# Patient Record
Sex: Male | Born: 1960 | Race: White | Hispanic: No | Smoking: Never smoker
Health system: Southern US, Community
[De-identification: ages and names within clinical notes are randomized; demographics above are authoritative.]

## PROBLEM LIST (undated history)

## (undated) DIAGNOSIS — T7840XA Allergy, unspecified, initial encounter: Secondary | ICD-10-CM

## (undated) DIAGNOSIS — N189 Chronic kidney disease, unspecified: Secondary | ICD-10-CM

## (undated) DIAGNOSIS — G8220 Paraplegia, unspecified: Secondary | ICD-10-CM

## (undated) DIAGNOSIS — N529 Male erectile dysfunction, unspecified: Secondary | ICD-10-CM

## (undated) DIAGNOSIS — Z87440 Personal history of urinary (tract) infections: Secondary | ICD-10-CM

## (undated) DIAGNOSIS — Z978 Presence of other specified devices: Secondary | ICD-10-CM

## (undated) DIAGNOSIS — M62838 Other muscle spasm: Secondary | ICD-10-CM

## (undated) DIAGNOSIS — Z960 Presence of urogenital implants: Secondary | ICD-10-CM

## (undated) HISTORY — PX: TONSILLECTOMY: SUR1361

## (undated) HISTORY — DX: Other muscle spasm: M62.838

## (undated) HISTORY — PX: OTHER SURGICAL HISTORY: SHX169

## (undated) HISTORY — PX: APPENDECTOMY: SHX54

## (undated) HISTORY — DX: Paraplegia, unspecified: G82.20

## (undated) HISTORY — DX: Allergy, unspecified, initial encounter: T78.40XA

## (undated) HISTORY — DX: Presence of urogenital implants: Z96.0

## (undated) HISTORY — DX: Chronic kidney disease, unspecified: N18.9

## (undated) HISTORY — PX: FEMUR FRACTURE SURGERY: SHX633

## (undated) HISTORY — DX: Personal history of urinary (tract) infections: Z87.440

## (undated) HISTORY — DX: Male erectile dysfunction, unspecified: N52.9

## (undated) HISTORY — DX: Presence of other specified devices: Z97.8

## (undated) SURGERY — COLONOSCOPY
Anesthesia: Monitor Anesthesia Care

---

## 2007-12-04 ENCOUNTER — Ambulatory Visit: Payer: Self-pay | Admitting: Family Medicine

## 2007-12-04 DIAGNOSIS — J309 Allergic rhinitis, unspecified: Secondary | ICD-10-CM | POA: Insufficient documentation

## 2007-12-31 ENCOUNTER — Ambulatory Visit: Payer: Self-pay | Admitting: Family Medicine

## 2007-12-31 DIAGNOSIS — N390 Urinary tract infection, site not specified: Secondary | ICD-10-CM | POA: Insufficient documentation

## 2007-12-31 DIAGNOSIS — R5381 Other malaise: Secondary | ICD-10-CM | POA: Insufficient documentation

## 2007-12-31 DIAGNOSIS — R5383 Other fatigue: Secondary | ICD-10-CM

## 2007-12-31 LAB — CONVERTED CEMR LAB
Bilirubin Urine: NEGATIVE
Glucose, Urine, Semiquant: NEGATIVE
Ketones, urine, test strip: NEGATIVE
Nitrite: NEGATIVE
Protein, U semiquant: NEGATIVE
Specific Gravity, Urine: 1.01
Urobilinogen, UA: NEGATIVE
pH: 7

## 2008-01-01 ENCOUNTER — Encounter: Payer: Self-pay | Admitting: Family Medicine

## 2008-01-04 ENCOUNTER — Encounter: Payer: Self-pay | Admitting: Family Medicine

## 2008-01-06 LAB — CONVERTED CEMR LAB
HCT: 46.2 % (ref 39.0–52.0)
Hemoglobin: 14.9 g/dL (ref 13.0–17.0)
MCHC: 32.3 g/dL (ref 30.0–36.0)
MCV: 93.5 fL (ref 78.0–100.0)
Platelets: 284 10*3/uL (ref 150–400)
RBC: 4.94 M/uL (ref 4.22–5.81)
RDW: 13.4 % (ref 11.5–15.5)
Sex Hormone Binding: 15 nmol/L (ref 13–71)
TSH: 1.501 microintl units/mL (ref 0.350–5.50)
Testosterone Free: 56.5 pg/mL (ref 47.0–244.0)
Testosterone-% Free: 2.8 % (ref 1.6–2.9)
Testosterone: 203.42 ng/dL — ABNORMAL LOW (ref 350–890)
WBC: 5.5 10*3/uL (ref 4.0–10.5)

## 2008-02-23 ENCOUNTER — Encounter: Payer: Self-pay | Admitting: Family Medicine

## 2008-02-23 DIAGNOSIS — L89309 Pressure ulcer of unspecified buttock, unspecified stage: Secondary | ICD-10-CM | POA: Insufficient documentation

## 2008-03-07 ENCOUNTER — Telehealth: Payer: Self-pay | Admitting: Family Medicine

## 2008-03-14 ENCOUNTER — Encounter: Payer: Self-pay | Admitting: Family Medicine

## 2008-03-15 ENCOUNTER — Encounter: Payer: Self-pay | Admitting: Family Medicine

## 2008-04-11 ENCOUNTER — Encounter: Payer: Self-pay | Admitting: Family Medicine

## 2008-04-26 ENCOUNTER — Encounter: Payer: Self-pay | Admitting: Family Medicine

## 2008-05-09 ENCOUNTER — Telehealth: Payer: Self-pay | Admitting: Family Medicine

## 2008-05-31 ENCOUNTER — Encounter: Payer: Self-pay | Admitting: Family Medicine

## 2008-06-28 ENCOUNTER — Encounter: Payer: Self-pay | Admitting: Family Medicine

## 2008-07-12 ENCOUNTER — Encounter: Payer: Self-pay | Admitting: Family Medicine

## 2008-08-04 ENCOUNTER — Encounter: Payer: Self-pay | Admitting: Family Medicine

## 2008-09-20 ENCOUNTER — Encounter: Payer: Self-pay | Admitting: Family Medicine

## 2008-09-30 ENCOUNTER — Encounter: Payer: Self-pay | Admitting: Family Medicine

## 2008-10-18 ENCOUNTER — Encounter: Payer: Self-pay | Admitting: Family Medicine

## 2008-11-08 ENCOUNTER — Encounter: Payer: Self-pay | Admitting: Family Medicine

## 2008-11-29 ENCOUNTER — Ambulatory Visit: Payer: Self-pay | Admitting: Family Medicine

## 2008-11-29 DIAGNOSIS — R21 Rash and other nonspecific skin eruption: Secondary | ICD-10-CM | POA: Insufficient documentation

## 2009-02-14 ENCOUNTER — Encounter: Payer: Self-pay | Admitting: Family Medicine

## 2009-03-06 ENCOUNTER — Telehealth: Payer: Self-pay | Admitting: Family Medicine

## 2009-03-13 ENCOUNTER — Ambulatory Visit: Payer: Self-pay | Admitting: Family Medicine

## 2009-03-13 DIAGNOSIS — M899 Disorder of bone, unspecified: Secondary | ICD-10-CM | POA: Insufficient documentation

## 2009-03-13 DIAGNOSIS — G8220 Paraplegia, unspecified: Secondary | ICD-10-CM | POA: Insufficient documentation

## 2009-03-13 DIAGNOSIS — M8580 Other specified disorders of bone density and structure, unspecified site: Secondary | ICD-10-CM | POA: Insufficient documentation

## 2009-03-13 LAB — CONVERTED CEMR LAB
Bilirubin Urine: NEGATIVE
Glucose, Urine, Semiquant: NEGATIVE
Ketones, urine, test strip: NEGATIVE
Nitrite: NEGATIVE
Protein, U semiquant: NEGATIVE
Specific Gravity, Urine: 1.01
Urobilinogen, UA: 0.2
pH: 7

## 2009-03-14 ENCOUNTER — Encounter: Payer: Self-pay | Admitting: Family Medicine

## 2009-03-15 ENCOUNTER — Encounter: Payer: Self-pay | Admitting: Family Medicine

## 2009-03-15 ENCOUNTER — Encounter: Admission: RE | Admit: 2009-03-15 | Discharge: 2009-03-15 | Payer: Self-pay | Admitting: Family Medicine

## 2009-03-15 LAB — CONVERTED CEMR LAB
ALT: 21 units/L (ref 0–53)
AST: 21 units/L (ref 0–37)
Albumin: 4.8 g/dL (ref 3.5–5.2)
Alkaline Phosphatase: 93 units/L (ref 39–117)
BUN: 11 mg/dL (ref 6–23)
CO2: 21 meq/L (ref 19–32)
Calcium: 9.8 mg/dL (ref 8.4–10.5)
Chloride: 103 meq/L (ref 96–112)
Cholesterol: 231 mg/dL — ABNORMAL HIGH (ref 0–200)
Creatinine, Ser: 0.88 mg/dL (ref 0.40–1.50)
Glucose, Bld: 94 mg/dL (ref 70–99)
HDL: 45 mg/dL (ref >39)
LDL Cholesterol: 156 mg/dL — ABNORMAL HIGH (ref 0–99)
Potassium: 4.1 meq/L (ref 3.5–5.3)
Sodium: 138 meq/L (ref 135–145)
TSH: 2.051 microintl units/mL (ref 0.350–4.500)
Total Bilirubin: 0.6 mg/dL (ref 0.3–1.2)
Total CHOL/HDL Ratio: 5.1
Total Protein: 6.9 g/dL (ref 6.0–8.3)
Triglycerides: 151 mg/dL — ABNORMAL HIGH (ref <150)
VLDL: 30 mg/dL (ref 0–40)
Vit D, 25-Hydroxy: 59 ng/mL (ref 30–89)

## 2009-03-17 ENCOUNTER — Telehealth: Payer: Self-pay | Admitting: Family Medicine

## 2009-03-28 ENCOUNTER — Ambulatory Visit: Payer: Self-pay | Admitting: Family Medicine

## 2009-03-28 DIAGNOSIS — R197 Diarrhea, unspecified: Secondary | ICD-10-CM | POA: Insufficient documentation

## 2009-03-28 LAB — CONVERTED CEMR LAB
Bilirubin Urine: NEGATIVE
Glucose, Urine, Semiquant: NEGATIVE
Ketones, urine, test strip: NEGATIVE
Nitrite: NEGATIVE
Protein, U semiquant: NEGATIVE
Specific Gravity, Urine: 1.01
Urobilinogen, UA: 0.2
pH: 6.5

## 2009-03-30 DIAGNOSIS — R799 Abnormal finding of blood chemistry, unspecified: Secondary | ICD-10-CM | POA: Insufficient documentation

## 2009-03-30 LAB — CONVERTED CEMR LAB
ALT: 24 units/L (ref 0–53)
AST: 18 units/L (ref 0–37)
Albumin: 4.5 g/dL (ref 3.5–5.2)
Alkaline Phosphatase: 89 units/L (ref 39–117)
Amylase: 101 units/L (ref 0–105)
BUN: 12 mg/dL (ref 6–23)
Basophils Absolute: 0 10*3/uL (ref 0.0–0.1)
Basophils Relative: 1 % (ref 0–1)
CO2: 23 meq/L (ref 19–32)
Calcium: 9.8 mg/dL (ref 8.4–10.5)
Chloride: 105 meq/L (ref 96–112)
Creatinine, Ser: 0.95 mg/dL (ref 0.40–1.50)
Eosinophils Absolute: 0.1 10*3/uL (ref 0.0–0.7)
Eosinophils Relative: 3 % (ref 0–5)
Glucose, Bld: 133 mg/dL — ABNORMAL HIGH (ref 70–99)
HCT: 48.4 % (ref 39.0–52.0)
Hemoglobin: 16.3 g/dL (ref 13.0–17.0)
Lipase: 165 units/L — ABNORMAL HIGH (ref 0–75)
Lymphocytes Relative: 19 % (ref 12–46)
Lymphs Abs: 1 10*3/uL (ref 0.7–4.0)
MCHC: 33.7 g/dL (ref 30.0–36.0)
MCV: 90.3 fL (ref 78.0–100.0)
Monocytes Absolute: 0.4 10*3/uL (ref 0.1–1.0)
Monocytes Relative: 8 % (ref 3–12)
Neutro Abs: 3.4 10*3/uL (ref 1.7–7.7)
Neutrophils Relative %: 69 % (ref 43–77)
Platelets: 226 10*3/uL (ref 150–400)
Potassium: 4.5 meq/L (ref 3.5–5.3)
RBC: 5.36 M/uL (ref 4.22–5.81)
RDW: 13.1 % (ref 11.5–15.5)
Sodium: 141 meq/L (ref 135–145)
TSH: 1.899 microintl units/mL (ref 0.350–4.500)
Total Bilirubin: 0.4 mg/dL (ref 0.3–1.2)
Total Protein: 6.8 g/dL (ref 6.0–8.3)
WBC: 5 10*3/uL (ref 4.0–10.5)

## 2009-04-03 ENCOUNTER — Encounter: Payer: Self-pay | Admitting: Family Medicine

## 2009-04-04 LAB — CONVERTED CEMR LAB
Amylase: 36 units/L (ref 0–105)
Lipase: 21 units/L (ref 0–75)

## 2009-04-10 ENCOUNTER — Ambulatory Visit: Payer: Self-pay | Admitting: Family Medicine

## 2009-04-10 DIAGNOSIS — K3189 Other diseases of stomach and duodenum: Secondary | ICD-10-CM | POA: Insufficient documentation

## 2009-04-10 DIAGNOSIS — R1013 Epigastric pain: Secondary | ICD-10-CM

## 2009-04-10 LAB — CONVERTED CEMR LAB
Bilirubin Urine: NEGATIVE
Glucose, Urine, Semiquant: NEGATIVE
Ketones, urine, test strip: NEGATIVE
Nitrite: NEGATIVE
Protein, U semiquant: NEGATIVE
Specific Gravity, Urine: 1.015
Urobilinogen, UA: 0.2
pH: 5.5

## 2009-04-18 ENCOUNTER — Encounter: Admission: RE | Admit: 2009-04-18 | Discharge: 2009-04-18 | Payer: Self-pay | Admitting: Family Medicine

## 2009-10-04 ENCOUNTER — Encounter: Payer: Self-pay | Admitting: Family Medicine

## 2009-10-30 ENCOUNTER — Ambulatory Visit: Payer: Self-pay | Admitting: Family Medicine

## 2009-10-30 ENCOUNTER — Telehealth (INDEPENDENT_AMBULATORY_CARE_PROVIDER_SITE_OTHER): Payer: Self-pay | Admitting: *Deleted

## 2009-10-30 LAB — CONVERTED CEMR LAB
Bilirubin Urine: NEGATIVE
Glucose, Urine, Semiquant: NEGATIVE
Ketones, urine, test strip: NEGATIVE
Nitrite: NEGATIVE
Protein, U semiquant: NEGATIVE
Specific Gravity, Urine: 1.01
Urobilinogen, UA: 0.2
pH: 7.5

## 2009-10-31 ENCOUNTER — Encounter: Payer: Self-pay | Admitting: Family Medicine

## 2010-01-29 ENCOUNTER — Ambulatory Visit: Payer: Self-pay | Admitting: Family Medicine

## 2010-01-29 DIAGNOSIS — J209 Acute bronchitis, unspecified: Secondary | ICD-10-CM | POA: Insufficient documentation

## 2010-06-19 ENCOUNTER — Ambulatory Visit: Payer: Self-pay | Admitting: Family Medicine

## 2010-06-19 LAB — CONVERTED CEMR LAB
Bilirubin Urine: NEGATIVE
Glucose, Urine, Semiquant: NEGATIVE
Ketones, urine, test strip: NEGATIVE
Nitrite: NEGATIVE
Protein, U semiquant: NEGATIVE
Specific Gravity, Urine: 1.01
Urobilinogen, UA: 0.2
pH: 7

## 2010-06-21 ENCOUNTER — Encounter: Payer: Self-pay | Admitting: Family Medicine

## 2010-09-18 ENCOUNTER — Telehealth: Payer: Self-pay | Admitting: Family Medicine

## 2010-10-16 NOTE — Letter (Signed)
Summary: [facility]  [facility]   Imported By: Lanelle Bal 01/02/2010 12:22:52  _____________________________________________________________________  External Attachment:    Type:   Image     Comment:   External Document
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
## 2010-10-16 NOTE — Letter (Signed)
Summary: [facility]  [facility]   Imported By: Lanelle Bal 07/21/2008 13:26:31  _____________________________________________________________________  External Attachment:    Type:   Image     Comment:   External Document
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
## 2010-10-16 NOTE — Letter (Signed)
Summary: [facility]  [facility]   Imported By: Lanelle Bal 07/25/2008 11:36:10  _____________________________________________________________________  External Attachment:    Type:   Image     Comment:   External Document

## 2010-10-16 NOTE — Assessment & Plan Note (Signed)
Summary: UA with cx  Nurse Visit   Vitals Entered By: Payton Spark CMA (October 30, 2009 1:12 PM)  Allergies: 1)  Cipro Laboratory Results   Urine Tests    Routine Urinalysis   Color: yellow Glucose: negative   (Normal Range: Negative) Bilirubin: negative   (Normal Range: Negative) Ketone: negative   (Normal Range: Negative) Spec. Gravity: 1.010   (Normal Range: 1.003-1.035) Blood: trace-intact   (Normal Range: Negative) pH: 7.5   (Normal Range: 5.0-8.0) Protein: negative   (Normal Range: Negative) Urobilinogen: 0.2   (Normal Range: 0-1) Nitrite: negative   (Normal Range: Negative) Leukocyte Esterace: small   (Normal Range: Negative)       Orders Added: 1)  UA Dipstick w/o Micro (automated)  [81003] 2)  T-Culture, Urine [[id number]]

## 2010-10-16 NOTE — Assessment & Plan Note (Signed)
Summary: UTI  Nurse Visit   Vital Signs:  Patient profile:   50 year old male Temp:     98.3 degrees F  Vitals Entered By: Payton Spark CMA (June 19, 2010 4:33 PM)  Impression & Recommendations:  Problem # 1:  UTI (ICD-599.0) I/O cath urine specimen + for UTI.  Send for cx given high risk status as a paraplegic. Will start him on Levaquin and f/u cx in 48 hrs.   The following medications were removed from the medication list:    Zithromax Z-pak 250 Mg Tabs (Azithromycin) .Marland Kitchen... 2 tabs by mouth x 1 day then 1 tab by mouth daily x 4 days His updated medication list for this problem includes:    Trimethoprim 100 Mg Tabs (Trimethoprim) .Marland Kitchen... Take 1 tablet by mouth once a day    Levaquin 750 Mg Tabs (Levofloxacin) .Marland Kitchen... 1 tab by mouth daily x 5 days  Orders: UA Dipstick w/o Micro (automated)  (81003) T-Culture, Urine ([id number])  Complete Medication List: 1)  Trimethoprim 100 Mg Tabs (Trimethoprim) .... Take 1 tablet by mouth once a day 2)  Diazepam 5 Mg Tabs (Diazepam) .... Take 1-2 by mouth daily as needed 3)  Asa 325mg   .... 1 by mouth daily 4)  B Complex  5)  Vit C  6)  Cranberry  7)  Vit E  8)  Fish Oil  9)  One A Day Mens Mv  10)  Stool Softner  11)  Proair Hfa 108 (90 Base) Mcg/act Aers (Albuterol sulfate) .... 2 puffs q 6 hrs prn 12)  Levaquin 750 Mg Tabs (Levofloxacin) .Marland Kitchen.. 1 tab by mouth daily x 5 days   CC: ? UTI x weeks. C/o dysuria.    Current Medications (verified): 1)  Trimethoprim 100 Mg  Tabs (Trimethoprim) .... Take 1 Tablet By Mouth Once A Day 2)  Diazepam 5 Mg  Tabs (Diazepam) .... Take 1-2 By Mouth Daily As Needed 3)  Asa 325mg  .... 1 By Mouth Daily 4)  B Complex 5)  Vit C 6)  Cranberry 7)  Vit E 8)  Fish Oil 9)  One A Day Mens Mv 10)  Stool Softner 11)  Proair Hfa 108 (90 Base) Mcg/act Aers (Albuterol Sulfate) .... 2 Puffs Q 6 Hrs Prn  Allergies (verified): 1)  Cipro Laboratory Results   Urine Tests    Routine Urinalysis     Color: yellow Appearance: Clear Glucose: negative   (Normal Range: Negative) Bilirubin: negative   (Normal Range: Negative) Ketone: negative   (Normal Range: Negative) Spec. Gravity: 1.010   (Normal Range: 1.003-1.035) Blood: large   (Normal Range: Negative) pH: 7.0   (Normal Range: 5.0-8.0) Protein: negative   (Normal Range: Negative) Urobilinogen: 0.2   (Normal Range: 0-1) Nitrite: negative   (Normal Range: Negative) Leukocyte Esterace: small   (Normal Range: Negative)       Orders Added: 1)  UA Dipstick w/o Micro (automated)  [81003] 2)  T-Culture, Urine [[id number]] 3)  Est. Patient Level I [[address]] Prescriptions: LEVAQUIN 750 MG TABS (LEVOFLOXACIN) 1 tab by mouth daily x 5 days  #5 tabs x 0   Entered and Authorized by:   Seymour Bars DO   Signed by:   Seymour Bars DO on 06/19/2010   Method used:   Electronically to        [facility]* (retail)       [address] [place], [place]  [address]  Ph: [phone]       Fax: [phone]   RxID:   [id number]   Appended Document: UTI LMOM informing Pt of the above
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
## 2010-10-16 NOTE — Letter (Signed)
Summary: Discharge Summary  Discharge Summary   Imported By: Kathlene November 05/09/2008 15:42:36  _____________________________________________________________________  External Attachment:    Type:   Image     Comment:   External Document
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
## 2010-10-16 NOTE — Letter (Signed)
Summary: [facility]  [facility]   Imported By: Lanelle Bal 08/06/2008 08:24:17  _____________________________________________________________________  External Attachment:    Type:   Image     Comment:   External Document

## 2010-10-16 NOTE — Progress Notes (Signed)
Summary: SUSPECTED UTI  Phone Note Call from Patient   Summary of Call: Marylou Flesher, please call pt for suspected uti [phone] -thanks, Diane  Follow-up for Phone Call        Nurse visit scheduled for 1pm Follow-up by: Payton Spark CMA,  October 30, 2009 12:28 PM
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
## 2010-10-16 NOTE — Letter (Signed)
Summary: [facility]  [facility]   Imported By: Lanelle Bal 01/04/2009 12:15:56  _____________________________________________________________________  External Attachment:    Type:   Image     Comment:   External Document

## 2010-10-16 NOTE — Letter (Signed)
Summary: [facility]  [facility]   Imported By: Maryln Gottron 03/09/2009 15:11:54  _____________________________________________________________________  External Attachment:    Type:   Image     Comment:   External Document

## 2010-10-16 NOTE — Letter (Signed)
Summary: [facility]  [facility]   Imported By: Lanelle Bal 10/05/2008 14:52:06  _____________________________________________________________________  External Attachment:    Type:   Image     Comment:   External Document

## 2010-10-16 NOTE — Miscellaneous (Signed)
Summary: stage 4 sacral decub/ cellulitis - admitted  Clinical Lists Changes  Problems: Assessed PRESSURE ULCER BUTTOCK as comment only - Stage 4 sacral decub with cellulitis, fever and WBC of 14 at [facility] ED 03-05-08.  Admitted to [facility] for surgical debridement, IV antibiotics.  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  Admitted to [facility] for surgical debridement, IV antibiotics.    Complete Medication List: 1)  Trimethoprim 100 Mg Tabs (Trimethoprim) .... Take 1 tablet by mouth once a day 2)  Diazepam 5 Mg Tabs (Diazepam) .... Take 1-2 by mouth daily as needed 3)  Flonase 50 Mcg/act Susp (Fluticasone propionate) .... 2 sprays per nostril daily 4)  Fexofenadine Hcl 180 Mg Tabs (Fexofenadine hcl) .Marland Kitchen.. 1 tab by mouth daily

## 2010-10-16 NOTE — Letter (Signed)
Summary: [facility]  [facility]   Imported By: Lanelle Bal 08/15/2008 14:52:36  _____________________________________________________________________  External Attachment:    Type:   Image     Comment:   External Document

## 2010-10-16 NOTE — Letter (Signed)
Summary: Discharge Summary  Discharge Summary   Imported By: Harlene Salts 05/13/2008 14:59:32  _____________________________________________________________________  External Attachment:    Type:   Image     Comment:   External Document

## 2010-10-16 NOTE — Progress Notes (Signed)
Summary: NEED REFILLS 90 DAY SUPPLY  Phone Note Call from Patient Call back at [phone]   Caller: Patient Call For: DOCTOR Summary of Call: WANT 90 DAY SUPPLY RX'S FOR DIAZEPAM AND TRIMETHOPRIM WITH REFILLS. PATIENT ALSO REQUESTING 30 DAY SUPPLY TO BE SENT TO [facility] SINCE HE IS COMPLETELY OUT OF DIAZEPAM AND NEED NOW. PATIENT WOULD LIKE THE 90-DAY SUPPLY RX'S MAILED TO HIS HOME ADDRESS SINCE HE IS BEDRIDDEN AT THIS POINT FOR DECUB ULCERS. Initial call taken by: Harlene Salts,  May 09, 2008 11:31 AM      Prescriptions: DIAZEPAM 5 MG  TABS (DIAZEPAM) take 1-2 by mouth daily as needed  #90 x 0   Entered and Authorized by:   Nani Gasser MD   Signed by:   Nani Gasser MD on 05/09/2008   Method used:   Print then Give to Patient   RxID:   [id number] TRIMETHOPRIM 100 MG  TABS (TRIMETHOPRIM) Take 1 tablet by mouth once a day  #90 x 2   Entered and Authorized by:   Nani Gasser MD   Signed by:   Nani Gasser MD on 05/09/2008   Method used:   Print then Give to Patient   RxID:   [id number]   Appended Document: NEED REFILLS 90 DAY SUPPLY Prescriptions: DIAZEPAM 5 MG  TABS (DIAZEPAM) take 1-2 by mouth daily as needed  #30 x 0   Entered and Authorized by:   Nani Gasser MD   Signed by:   Nani Gasser MD on 05/10/2008   Method used:   Printed then faxed to ...         RxID:   [id number]
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
## 2010-10-16 NOTE — Assessment & Plan Note (Signed)
Summary: Diarrhea, UTI   Vital Signs:  Patient profile:   50 year old male Height:      66 inches Pulse rate:   111 / minute BP sitting:   130 / 79  (left arm) Cuff size:   large  Vitals Entered By: Kathlene November (March 28, 2009 8:16 AM) CC: sediment in bag, strong odor to urine   Primary Care Provider:  Seymour Bars DO  CC:  sediment in bag and strong odor to urine.  History of Present Illness: sediment in bag, strong odor to urine.  Dr. Leonard Schwartz put him on Levaquin on July 2nd for Klebsiella and psuedomonas UTI by culture.  Completed the course. Sxs started back last Friday.    Did have diarrhea about 2 weeks before UTI sxs. Started afer drinking milk. No diarrhea from the levaquin. Stomach has been gurgling after eating dairy. No pain but has not feeling from epigastric area down. No diarrhea. No GERD.  No constipation. No known hx of milk allergy. Has also had bad muscle spasms.  Has tried baclofen in the past but mentally made him feel flat.  Tried dantrolene earlier in the year and had bad side effects.  Interupted his bowel routine.   Current Medications (verified): 1)  Trimethoprim 100 Mg  Tabs (Trimethoprim) .... Take 1 Tablet By Mouth Once A Day 2)  Diazepam 5 Mg  Tabs (Diazepam) .... Take 1-2 By Mouth Daily As Needed 3)  Asa 325mg  .... 1 By Mouth Daily 4)  B Complex 5)  Vit C 6)  Cranberry 7)  Vit E 8)  Fish Oil 9)  One A Day Mens Mv 10)  Stool Softner 11)  Bactroban 2 % Oint (Mupirocin) .... Apply To Abdominal Wound Three Times A Day X 10 Days  Allergies (verified): 1)  Cipro  Comments:  Nurse/Medical Assistant: The patient's medications and allergies were reviewed with the patient and were updated in the Medication and Allergy Lists. Kathlene November (March 28, 2009 8:18 AM)  Physical Exam  General:  Well-developed,well-nourished,in no acute distress; alert,appropriate and cooperative throughout examination   Impression & Recommendations:  Problem # 1:  DIARRHEA  (ICD-787.91) Assessment New Will get labs to rule out liver and pancreas prolblems. OVer all the diarrhea has improved just having stomach upset so will also check for mild allergy. If white count elevated consider futher eval of abdomen. No prior exposure to ABX so C. diff colitis is very unlikely.  Orders: T-Comprehensive Metabolic Panel [id number]) T-CBC w/Diff [id number]) T-TSH [id number]) T-Amylase [phone]) T-Lipase [id number]) T- * Misc. Laboratory test [id number])  Problem # 2:  UTI (ICD-599.0) Assessment: Unchanged Still having sxs. Will resend cutlure. Will restart Levaquin and if + culture will extend for total of 10 days. Call if not improving.  The following medications were removed from the medication list:    Levaquin 750 Mg Tabs (Levofloxacin) .Marland Kitchen... 1 tab by mouth daily x 5 days. His updated medication list for this problem includes:    Trimethoprim 100 Mg Tabs (Trimethoprim) .Marland Kitchen... Take 1 tablet by mouth once a day    Levaquin 500 Mg Tabs (Levofloxacin) .Marland Kitchen... Take 1 tablet by mouth once a day for 5 days  Orders: UA Dipstick w/o Micro (automated)  (81003) T-Urine Culture (Spectrum Order) [id number])  Complete Medication List: 1)  Trimethoprim 100 Mg Tabs (Trimethoprim) .... Take 1 tablet by mouth once a day 2)  Diazepam 5 Mg Tabs (Diazepam) .... Take 1-2 by mouth daily as needed 3)  Asa 325mg   .Marland KitchenMarland KitchenMarland Kitchen  1 by mouth daily 4)  B Complex  5)  Vit C  6)  Cranberry  7)  Vit E  8)  Fish Oil  9)  One A Day Mens Mv  10)  Stool Softner  11)  Bactroban 2 % Oint (Mupirocin) .... Apply to abdominal wound three times a day x 10 days 12)  Levaquin 500 Mg Tabs (Levofloxacin) .... Take 1 tablet by mouth once a day for 5 days Prescriptions: LEVAQUIN 500 MG TABS (LEVOFLOXACIN) Take 1 tablet by mouth once a day for 5 days  #5 x 0   Entered and Authorized by:   Nani Gasser MD   Signed by:   Nani Gasser MD on 03/28/2009   Method used:   Electronically to         [facility]* (retail)       [address] [place], [place]  [address]       Ph: [phone]       Fax: [phone]   RxID:   [id number]   Laboratory Results   Urine Tests  Date/Time Received: 03/28/2009 Date/Time Reported: 03/28/2009  Routine Urinalysis   Color: yellow Appearance: Clear Glucose: negative   (Normal Range: Negative) Bilirubin: negative   (Normal Range: Negative) Ketone: negative   (Normal Range: Negative) Spec. Gravity: 1.010   (Normal Range: 1.003-1.035) Blood: trace-intact   (Normal Range: Negative) pH: 6.5   (Normal Range: 5.0-8.0) Protein: negative   (Normal Range: Negative) Urobilinogen: 0.2   (Normal Range: 0-1) Nitrite: negative   (Normal Range: Negative) Leukocyte Esterace: small   (Normal Range: Negative)

## 2010-10-16 NOTE — Progress Notes (Signed)
Summary: UA culture results  Phone Note Call from Patient Call back at Work Phone [phone]   Caller: Patient Call For: Seymour Bars DO Summary of Call: Pt calls wanting the UA culture results. KJ LPN Initial call taken by: Kathlene November,  March 17, 2009 9:00 AM  Follow-up for Phone Call        Just came back this AM.  + for both Pseudomonas and Klebsiella.  Both sensitive to Levaquin.  Will treat for 5 days.   Follow-up by: Seymour Bars DO,  March 17, 2009 9:30 AM    New/Updated Medications: LEVAQUIN 750 MG TABS (LEVOFLOXACIN) 1 tab by mouth daily x 5 days.   Prescriptions: LEVAQUIN 750 MG TABS (LEVOFLOXACIN) 1 tab by mouth daily x 5 days.  #5 x 0   Entered and Authorized by:   Seymour Bars DO   Signed by:   Seymour Bars DO on 03/17/2009   Method used:   Electronically to        [facility]* (retail)       [address] [place], [place]  [address]       Ph: [phone]       Fax: [phone]   RxID:   [id number]   Appended Document: UA culture results Pt notified of results nad that med sent to pharmacy. KJ LPN

## 2010-10-18 NOTE — Progress Notes (Signed)
Summary: Needs Protopic ointment  Phone Note Call from Patient Call back at Home Phone [phone]   Caller: Patient Call For: Thomas Bars DO Summary of Call: Pt needs the Protopic topical ointment sent to [facility]. Uses this for skin irritation. Got last year and hasn't needed again until now. Initial call taken by: Kathlene November LPN,  September 18, 2010 10:34 AM    New/Updated Medications: PROTOPIC 0.03 % OINT (TACROLIMUS) apply two times a day as directed Prescriptions: PROTOPIC 0.03 % OINT (TACROLIMUS) apply two times a day as directed  #60 g x 0   Entered and Authorized by:   Thomas Bars DO   Signed by:   Thomas Bars DO on 09/18/2010   Method used:   Faxed to ...       [facility] (mail-order)             , [place]         Ph: [phone]       Fax: [phone]   RxID:   [id number] PROTOPIC 0.03 % OINT (TACROLIMUS) apply two times a day as directed  #60 g x 0   Entered and Authorized by:   Thomas Bars DO   Signed by:   Thomas Bars DO on 09/18/2010   Method used:   Electronically to        [facility]* (retail)       [address] [place], [place]  [address]       Ph: [phone]       Fax: [phone]   RxID:   [id number]

## 2010-12-31 ENCOUNTER — Telehealth: Payer: Self-pay | Admitting: Family Medicine

## 2010-12-31 NOTE — Telephone Encounter (Signed)
Urine specimen being dropped off to office, believes he has a UTI.  Please call patient at [phone].

## 2011-01-14 ENCOUNTER — Telehealth: Payer: Self-pay | Admitting: Family Medicine

## 2011-01-14 ENCOUNTER — Ambulatory Visit (INDEPENDENT_AMBULATORY_CARE_PROVIDER_SITE_OTHER): Payer: 59 | Admitting: Family Medicine

## 2011-01-14 DIAGNOSIS — N390 Urinary tract infection, site not specified: Secondary | ICD-10-CM

## 2011-01-14 DIAGNOSIS — R300 Dysuria: Secondary | ICD-10-CM

## 2011-01-14 LAB — POCT URINALYSIS DIPSTICK
Bilirubin, UA: NEGATIVE
Glucose, UA: NEGATIVE
Ketones, UA: NEGATIVE
Nitrite, UA: NEGATIVE
Protein, UA: NEGATIVE
Spec Grav, UA: 1.015
Urobilinogen, UA: 0.2
pH, UA: 6.5

## 2011-01-14 NOTE — Telephone Encounter (Addendum)
Pt called and wants permission to drop off urine specimen for ? UTI. PLAN:  Triage nurse called pt but had to [facility] for pt telling him to call back to triage nurse for further instructions.  Pending pt call back.  Pt returned the call to nurse by leaving another message on vm.  Triage nurse responded and told pt to come @ 3:00pm for nurse visit and give clean catch urine specimen.  Pt voiced understanding. Jarvis Newcomer, LPN [name]

## 2011-01-14 NOTE — Telephone Encounter (Signed)
Pls let pt know that his urinalysis was inconclusive.  I sent it for culture.  Is he symptomatic?  Should get results back WED or Thurs.

## 2011-01-14 NOTE — Telephone Encounter (Signed)
Pt aware of the above  

## 2011-01-14 NOTE — Progress Notes (Signed)
  Subjective:    Patient ID: Thomas Schroeder, male    DOB: 07-Jun-1961, 50 y.o.   MRN: [id number]  HPI    Review of Systems     Objective:   Physical Exam        Assessment & Plan:

## 2011-01-17 ENCOUNTER — Telehealth: Payer: Self-pay | Admitting: Family Medicine

## 2011-01-17 ENCOUNTER — Telehealth: Payer: Self-pay | Admitting: *Deleted

## 2011-01-17 LAB — URINE CULTURE: Colony Count: >=100000

## 2011-01-17 NOTE — Telephone Encounter (Signed)
LMOM informing Pt of the above. Pt to CB about urology referral

## 2011-01-17 NOTE — Telephone Encounter (Signed)
Pls let pt know that his urine cx grew out multiple species which usually means that it's contaminated.  If he is having urinary symptoms, I can either get him in here or give his complicated history, in with urology.

## 2011-01-17 NOTE — Telephone Encounter (Signed)
What are his symptoms?

## 2011-01-17 NOTE — Telephone Encounter (Signed)
Pt states he does not want to see urology bc he has seen multiple in the past and has never been helped. Pt states he is still having Sxs and has had the Sxs x 3 weeks. Please advise.

## 2011-01-18 MED ORDER — LEVOFLOXACIN 750 MG PO TABS: 750.0000 mg | ORAL_TABLET | Freq: Every day | ORAL | Status: DC

## 2011-01-18 NOTE — Telephone Encounter (Signed)
I will go ahead and treat Thomas Schroeder with a round of Levaquin but without a clear urine culture, this may or may not work.  If symptoms have not resolved in 5-7 days, I do want him to come in to see me for an OV.

## 2011-01-18 NOTE — Telephone Encounter (Signed)
Pt states his urine has foul odor, diarrhea, pain, sensitivity, and pressure but denies fever. Pt is very frustrated and would like relief. Please advise.

## 2011-01-18 NOTE — Telephone Encounter (Signed)
Pt aware of the above  

## 2011-01-25 ENCOUNTER — Ambulatory Visit (INDEPENDENT_AMBULATORY_CARE_PROVIDER_SITE_OTHER): Payer: 59 | Admitting: Family Medicine

## 2011-01-25 ENCOUNTER — Telehealth (INDEPENDENT_AMBULATORY_CARE_PROVIDER_SITE_OTHER): Payer: 59 | Admitting: Family Medicine

## 2011-01-25 VITALS — BP 146/96 | HR 64 | Temp 98.1°F | Resp 20 | Ht 66.0 in | Wt 180.0 lb

## 2011-01-25 DIAGNOSIS — Z125 Encounter for screening for malignant neoplasm of prostate: Secondary | ICD-10-CM

## 2011-01-25 DIAGNOSIS — Z13220 Encounter for screening for lipoid disorders: Secondary | ICD-10-CM

## 2011-01-25 DIAGNOSIS — Z130 Encounter for screening for diseases of the blood and blood-forming organs and certain disorders involving the immune mechanism: Secondary | ICD-10-CM

## 2011-01-25 DIAGNOSIS — N390 Urinary tract infection, site not specified: Secondary | ICD-10-CM

## 2011-01-25 LAB — POCT URINALYSIS DIPSTICK
Bilirubin, UA: NEGATIVE
Glucose, UA: NEGATIVE
Ketones, UA: NEGATIVE
Nitrite, UA: NEGATIVE
Protein, UA: NEGATIVE
Spec Grav, UA: 1.015
Urobilinogen, UA: 0.2
pH, UA: 7.5

## 2011-01-25 MED ORDER — LEVOFLOXACIN 750 MG PO TABS: 750.0000 mg | ORAL_TABLET | Freq: Every day | ORAL | Status: DC

## 2011-01-25 NOTE — Patient Instructions (Signed)
Restart Levaquin today and take for 10 days.  Call me if any problems.  Update fasting labs Mon morning downstairs. I will call you w/ results Tues or Wed.  Return for a Physical in August.

## 2011-01-25 NOTE — Progress Notes (Signed)
  Subjective:    Patient ID: Thomas Schroeder, male    DOB: 03-12-1961, 50 y.o.   MRN: [id number]  HPI 50 yo WM presents for a recent UTI.  He is paraplegic and does a weeklong foley catheter which he changes at home.  He has been doing this since 1995 after having leakage from I/O catheters. He noticed about 2 wks ago that he had white sediment in his urine.  He then started to have looser stools.  He gets an increase in HAs with abdominal bloating.  He usually gets more gas.  No fever or chills but had a little more fatigue.  No nausea or vomitting.  He took a round of levaquin though his urine culture showed multiple species.  His GI symptoms and cloudy urine quickly improved once he started Levaquin.  But after completing it on Tuesday this wk, he woke up Wed with cloudy urine and diarrhea again.  This UTI seems to have been precipitated by a business trip to [facility]  He had to cut back on fluid intake due to the long flight and not being able to empty his leg bag.  He is on trimethoprim 100 mg daily for prophylaxis.  He has been getting UTIs about 2-3 a yr.  Denies blood in the urine.  He keeps tubing clean and is not meeting resistance with the tubing BP 146/96  Pulse 64  Temp(Src) 98.1 F (36.7 C) (Oral)  Resp 20  Ht 5\' 6"  (1.676 m)  Wt 180 lb (81.647 kg)  BMI 29.05 kg/m2  SpO2 100%     Review of Systems  Constitutional: Negative for fever, chills, appetite change and fatigue.  Respiratory: Negative for shortness of breath.   Cardiovascular: Negative for chest pain, palpitations and leg swelling.  Gastrointestinal: Positive for diarrhea and abdominal distention. Negative for nausea, vomiting, abdominal pain, constipation and blood in stool.  Genitourinary: Negative for hematuria, decreased urine volume and discharge.  Neurological: Positive for headaches. Negative for weakness.       Objective:   Physical Exam  Constitutional: He appears well-developed and well-nourished. No  distress.  Eyes: Conjunctivae are normal. No scleral icterus.  Neck: Neck supple.  Cardiovascular: Normal rate, regular rhythm and normal heart sounds.   No murmur heard. Pulmonary/Chest: Effort normal and breath sounds normal.  Abdominal: Soft. He exhibits no distension. There is no tenderness.  Musculoskeletal: He exhibits edema (1+nonpitting bilat leg edema).  Skin: Skin is warm and dry.  Psychiatric: He has a normal mood and affect.          Assessment & Plan:

## 2011-01-25 NOTE — Assessment & Plan Note (Signed)
UTI last wk with recurrence after completing 5 days of levaquin indicating he needed longer treatment.  Will extend him 10 more days.  He does fall in the complicated UTI picture since he is paraplegic.  He gets about 2-3 UTI/s year with indwelling foley on prophylactic trimethoprim which is not too bad.  Will update his renal function / PSA with labs on Monday.  Call if not resolved in 10 days.  Try to re- cx today.  Had MRSA in a decubitus ulcer 2 yrs ago.

## 2011-01-28 LAB — PSA: PSA: 1.13 ng/mL (ref <=4.00)

## 2011-01-28 LAB — CBC WITH DIFFERENTIAL/PLATELET
Basophils Absolute: 0 10*3/uL (ref 0.0–0.1)
Basophils Relative: 1 % (ref 0–1)
Eosinophils Absolute: 0.3 10*3/uL (ref 0.0–0.7)
Eosinophils Relative: 6 % — ABNORMAL HIGH (ref 0–5)
HCT: 44.5 % (ref 39.0–52.0)
Hemoglobin: 15 g/dL (ref 13.0–17.0)
Lymphocytes Relative: 26 % (ref 12–46)
Lymphs Abs: 1.2 10*3/uL (ref 0.7–4.0)
MCH: 31.5 pg (ref 26.0–34.0)
MCHC: 33.7 g/dL (ref 30.0–36.0)
MCV: 93.5 fL (ref 78.0–100.0)
Monocytes Absolute: 0.4 10*3/uL (ref 0.1–1.0)
Monocytes Relative: 10 % (ref 3–12)
Neutro Abs: 2.6 10*3/uL (ref 1.7–7.7)
Neutrophils Relative %: 57 % (ref 43–77)
Platelets: 217 10*3/uL (ref 150–400)
RBC: 4.76 MIL/uL (ref 4.22–5.81)
RDW: 13.6 % (ref 11.5–15.5)
WBC: 4.5 10*3/uL (ref 4.0–10.5)

## 2011-01-28 LAB — LIPID PANEL
Cholesterol: 196 mg/dL (ref 0–200)
HDL: 43 mg/dL (ref >39)
LDL Cholesterol: 119 mg/dL — ABNORMAL HIGH (ref 0–99)
Total CHOL/HDL Ratio: 4.6 Ratio
Triglycerides: 171 mg/dL — ABNORMAL HIGH (ref <150)
VLDL: 34 mg/dL (ref 0–40)

## 2011-01-29 ENCOUNTER — Telehealth: Payer: Self-pay | Admitting: Family Medicine

## 2011-01-29 LAB — COMPLETE METABOLIC PANEL WITH GFR
ALT: 29 U/L (ref 0–53)
AST: 22 U/L (ref 0–37)
Albumin: 4.4 g/dL (ref 3.5–5.2)
Alkaline Phosphatase: 54 U/L (ref 39–117)
BUN: 17 mg/dL (ref 6–23)
CO2: 23 mEq/L (ref 19–32)
Calcium: 9.3 mg/dL (ref 8.4–10.5)
Chloride: 103 mEq/L (ref 96–112)
Creat: 0.99 mg/dL (ref 0.40–1.50)
GFR, Est African American: >60 mL/min (ref >60)
GFR, Est Non African American: >60 mL/min (ref >60)
Glucose, Bld: 102 mg/dL — ABNORMAL HIGH (ref 70–99)
Potassium: 4.2 mEq/L (ref 3.5–5.3)
Sodium: 138 mEq/L (ref 135–145)
Total Bilirubin: 0.5 mg/dL (ref 0.3–1.2)
Total Protein: 6.3 g/dL (ref 6.0–8.3)

## 2011-01-29 NOTE — Telephone Encounter (Signed)
LMOM informing Pt of the above 

## 2011-01-29 NOTE — Telephone Encounter (Signed)
Pls let pt know that his blood counts came back normal.  Prostate screen is normal.  Fasting sugar 102, just barely in the pre diabetic range with normal liver and kidney function.  Cholesterol OK other than slightly high TGs.  Work on healthy - low sugar, low carb diet with regular physical activity.  Repeat in 1 yr.  Urine culture grew out staph but sensitivities are still pending.

## 2011-01-30 LAB — URINE CULTURE: Colony Count: >=100000

## 2011-01-31 ENCOUNTER — Telehealth: Payer: Self-pay | Admitting: Family Medicine

## 2011-01-31 MED ORDER — SULFAMETHOXAZOLE-TMP DS 800-160 MG PO TABS: 1.0000 | ORAL_TABLET | Freq: Two times a day (BID) | ORAL | Status: AC

## 2011-01-31 NOTE — Telephone Encounter (Signed)
Pls let pt know that his urine culture grew out MRSA (resistant staph) which is RESISTANT to Levaquin, so we need to change it.  I will change him to Bactrim DS 2 x a day for 14 days.  Call if any problems.

## 2011-01-31 NOTE — Telephone Encounter (Signed)
Pt aware of the above  

## 2011-02-01 ENCOUNTER — Telehealth: Payer: Self-pay | Admitting: *Deleted

## 2011-02-01 ENCOUNTER — Telehealth: Payer: Self-pay | Admitting: Family Medicine

## 2011-02-01 MED ORDER — TETRACYCLINE HCL 500 MG PO CAPS: 500.0000 mg | ORAL_CAPSULE | Freq: Four times a day (QID) | ORAL | Status: AC

## 2011-02-01 NOTE — Telephone Encounter (Signed)
LMOM informing Pt of the above 

## 2011-02-01 NOTE — Telephone Encounter (Signed)
Pt states he is having allergic reaction to Bactrim. His skin has red blotchy patches and is itchy after 1 dose. Please advise.

## 2011-02-01 NOTE — Telephone Encounter (Signed)
OK.  Stop the Bactrim DS and add to allergy list.  Use benadryl as needed for hives. The only other antibiotic left to treat with is tetracycline. I will send this to his pharmacy.

## 2011-02-01 NOTE — Telephone Encounter (Signed)
Pt called and said the med is not available that the provider gave him.  (Tetracycline 500 mg caps) Plan:  Talked with Dr. Cathey Endow and she has already taken care of this. Thomas Newcomer, LPN [name]

## 2011-02-01 NOTE — Telephone Encounter (Signed)
Pt LMOM stating tetracycline is on back order so he would like to finish bactrim even though he has rash. Dr. Rex Kras this and Pt can use benadryl prn w/ it. LMOM informing Pt of the above

## 2011-02-14 NOTE — Telephone Encounter (Signed)
Closed this encounter. Thomas Newcomer, LPN [name]

## 2011-03-19 ENCOUNTER — Encounter: Payer: Self-pay | Admitting: Family Medicine

## 2011-03-19 ENCOUNTER — Ambulatory Visit (INDEPENDENT_AMBULATORY_CARE_PROVIDER_SITE_OTHER): Payer: 59 | Admitting: Family Medicine

## 2011-03-19 DIAGNOSIS — K5289 Other specified noninfective gastroenteritis and colitis: Secondary | ICD-10-CM

## 2011-03-19 DIAGNOSIS — H6690 Otitis media, unspecified, unspecified ear: Secondary | ICD-10-CM

## 2011-03-19 DIAGNOSIS — R197 Diarrhea, unspecified: Secondary | ICD-10-CM

## 2011-03-19 DIAGNOSIS — K529 Noninfective gastroenteritis and colitis, unspecified: Secondary | ICD-10-CM

## 2011-03-19 DIAGNOSIS — R109 Unspecified abdominal pain: Secondary | ICD-10-CM

## 2011-03-19 MED ORDER — AMOXICILLIN 875 MG PO TABS: 875.0000 mg | ORAL_TABLET | Freq: Two times a day (BID) | ORAL | Status: AC

## 2011-03-19 MED ORDER — ONDANSETRON 8 MG PO TBDP: 8.0000 mg | ORAL_TABLET | Freq: Three times a day (TID) | ORAL | Status: DC | PRN

## 2011-03-19 NOTE — Patient Instructions (Addendum)
Take 10 days of Amoxicillin for L ear infection.  Use Immodium AD as needed for diarrhea and Zofran as needed for nausea.  Bland diet, clear fluids, rest.  Labs today.  Will call you w/ results on Thursday and set you up to see GI in [place].

## 2011-03-19 NOTE — Progress Notes (Signed)
  Subjective:    Patient ID: Thomas Schroeder, male    DOB: 16-Oct-1960, 50 y.o.   MRN: [id number]  HPI  50 yo WM presents for ear pain and diarrhea that started about 2 wks after we treated him for a UTI with Bactrim DS.  He's had hearing loss in the L > R.  He had some loose stools from the UTI and from the Bactrim DS.  2 wks ago, he started to have diarrhea.  He used some immodium for the cramping.  He started taking [facility] which helped.after a few days, his diarrhea resolved until today.  Today, he developed nausea, no vomitting and diarrhea.  No fevers or chills.  Denies blood in stool.  h  BP 130/78  Pulse 106  Temp(Src) 98.4 F (36.9 C) (Oral)  SpO2 96%   Review of Systems  Constitutional: Positive for chills and fatigue.  HENT: Positive for ear pain. Negative for sore throat and rhinorrhea.   Gastrointestinal: Positive for nausea, abdominal pain, diarrhea and abdominal distention. Negative for vomiting and blood in stool.  Skin: Negative for rash.       Objective:   Physical Exam  Constitutional: He appears well-developed and well-nourished.       In wheelchair  HENT:  Mouth/Throat: Oropharynx is clear and moist.       L TM with purulence behind the TM  Eyes: No scleral icterus.  Cardiovascular: Regular rhythm.  Tachycardia present.   No murmur heard. Pulmonary/Chest: Effort normal and breath sounds normal. No respiratory distress.  Abdominal: Soft. Bowel sounds are normal. He exhibits distension. There is tenderness. There is guarding.  Musculoskeletal: He exhibits no edema.  Lymphadenopathy:    He has no cervical adenopathy.  Skin: Skin is warm and dry.  Psychiatric: He has a normal mood and affect.          Assessment & Plan:

## 2011-03-20 ENCOUNTER — Telehealth: Payer: Self-pay | Admitting: Family Medicine

## 2011-03-20 DIAGNOSIS — K529 Noninfective gastroenteritis and colitis, unspecified: Secondary | ICD-10-CM | POA: Insufficient documentation

## 2011-03-20 DIAGNOSIS — H6690 Otitis media, unspecified, unspecified ear: Secondary | ICD-10-CM | POA: Insufficient documentation

## 2011-03-20 LAB — COMPLETE METABOLIC PANEL WITH GFR
ALT: 32 U/L (ref 0–53)
AST: 22 U/L (ref 0–37)
Albumin: 4.6 g/dL (ref 3.5–5.2)
Alkaline Phosphatase: 63 U/L (ref 39–117)
BUN: 18 mg/dL (ref 6–23)
CO2: 24 mEq/L (ref 19–32)
Calcium: 10 mg/dL (ref 8.4–10.5)
Chloride: 104 mEq/L (ref 96–112)
Creat: 1.01 mg/dL (ref 0.50–1.35)
GFR, Est African American: >60 mL/min (ref >60)
GFR, Est Non African American: >60 mL/min (ref >60)
Glucose, Bld: 101 mg/dL — ABNORMAL HIGH (ref 70–99)
Potassium: 4.4 mEq/L (ref 3.5–5.3)
Sodium: 139 mEq/L (ref 135–145)
Total Bilirubin: 0.4 mg/dL (ref 0.3–1.2)
Total Protein: 6.8 g/dL (ref 6.0–8.3)

## 2011-03-20 LAB — CBC WITH DIFFERENTIAL/PLATELET
Basophils Absolute: 0.1 10*3/uL (ref 0.0–0.1)
Basophils Relative: 1 % (ref 0–1)
Eosinophils Absolute: 0.2 10*3/uL (ref 0.0–0.7)
Eosinophils Relative: 4 % (ref 0–5)
HCT: 46.2 % (ref 39.0–52.0)
Hemoglobin: 16 g/dL (ref 13.0–17.0)
Lymphocytes Relative: 22 % (ref 12–46)
Lymphs Abs: 1.3 10*3/uL (ref 0.7–4.0)
MCH: 31.6 pg (ref 26.0–34.0)
MCHC: 34.6 g/dL (ref 30.0–36.0)
MCV: 91.3 fL (ref 78.0–100.0)
Monocytes Absolute: 0.5 10*3/uL (ref 0.1–1.0)
Monocytes Relative: 8 % (ref 3–12)
Neutro Abs: 3.8 10*3/uL (ref 1.7–7.7)
Neutrophils Relative %: 66 % (ref 43–77)
Platelets: 230 10*3/uL (ref 150–400)
RBC: 5.06 MIL/uL (ref 4.22–5.81)
RDW: 12.9 % (ref 11.5–15.5)
WBC: 5.8 10*3/uL (ref 4.0–10.5)

## 2011-03-20 LAB — AMYLASE: Amylase: 108 U/L — ABNORMAL HIGH (ref 0–105)

## 2011-03-20 LAB — LIPASE: Lipase: 99 U/L — ABNORMAL HIGH (ref 0–75)

## 2011-03-20 NOTE — Assessment & Plan Note (Signed)
Will treat L AOM with Amoxicillin x 10 days.  Call if any problems.  Can use probiotic this time to help with diarrhea.

## 2011-03-20 NOTE — Telephone Encounter (Signed)
Pls let pt know that his blood counts, kidney and liver function came back normal.  His pancreatic enzymes are slightly high but this could be from illness.    Repeat Amylase and Lipase in 2 wks.  Lab only.   Stick to bland diet, mostly clear liquids until abdominal symptoms improve.

## 2011-03-20 NOTE — Assessment & Plan Note (Signed)
Likely gastroenteritis, not having anythign to do with his bout of diarrhea while on Bactrim DS.  Treat with supportive care.  Zofran, Immodium, clear fluids and rest.  Call if not resolved.  Labs today show a mild rise in pancreatic enzymes.  Repeat in 2 wks once not sick with diarrhea.

## 2011-03-21 NOTE — Telephone Encounter (Signed)
LMOM informing Pt  

## 2011-04-01 ENCOUNTER — Encounter: Payer: Self-pay | Admitting: Family Medicine

## 2011-04-01 ENCOUNTER — Other Ambulatory Visit: Payer: Self-pay | Admitting: Family Medicine

## 2011-04-01 ENCOUNTER — Ambulatory Visit (INDEPENDENT_AMBULATORY_CARE_PROVIDER_SITE_OTHER): Payer: 59 | Admitting: Family Medicine

## 2011-04-01 DIAGNOSIS — H6690 Otitis media, unspecified, unspecified ear: Secondary | ICD-10-CM

## 2011-04-01 DIAGNOSIS — H6692 Otitis media, unspecified, left ear: Secondary | ICD-10-CM

## 2011-04-01 MED ORDER — AMOXICILLIN-POT CLAVULANATE 875-125 MG PO TABS: 1.0000 | ORAL_TABLET | Freq: Two times a day (BID) | ORAL | Status: AC

## 2011-04-01 NOTE — Patient Instructions (Signed)
Recheck on your ear 2 weeks.

## 2011-04-01 NOTE — Progress Notes (Signed)
  Subjective:    Patient ID: Thomas Schroeder, male    DOB: 11/02/1960, 50 y.o.   MRN: [id number]  HPI LOM - here for followup. Completed Amox and feels some better but then ear still feels blocked. Can't hear as well out of it. No fever, was having some HA,  but decongestant has helped. No other symptoms. No ear pain.   Review of Systems     Objective:   Physical Exam  Constitutional: He is oriented to person, place, and time. He appears well-developed and well-nourished.  HENT:  Head: Normocephalic and atraumatic.  Right Ear: External ear normal.  Left Ear: External ear normal.  Nose: Nose normal.  Mouth/Throat: Oropharynx is clear and moist.       Right TM and canal are clear. Left canal is clear. Left TM is dull with some mild erythema at the 9:00 position. No apparent fluid behind the TM.  Neurological: He is alert and oriented to person, place, and time.  Skin: Skin is warm and dry.  Psychiatric: He has a normal mood and affect.          Assessment & Plan:  Left otitis media improved, but not resolved. Will treat with a course of Augmentin. Recommended taking a probiotic at the same time. Follow up in 2 weeks to recheck the ear to make sure his infection has completely cleared. If he is not improving over the next 5-7 days we can consider adding a nasal steroid spray to see if that improves the sensation of fullness in his ear.

## 2011-04-02 ENCOUNTER — Telehealth: Payer: Self-pay | Admitting: *Deleted

## 2011-04-02 DIAGNOSIS — R948 Abnormal results of function studies of other organs and systems: Secondary | ICD-10-CM

## 2011-04-02 LAB — CBC WITH DIFFERENTIAL/PLATELET
Basophils Absolute: 0 10*3/uL (ref 0.0–0.1)
Basophils Relative: 0 % (ref 0–1)
Eosinophils Absolute: 0.2 10*3/uL (ref 0.0–0.7)
Eosinophils Relative: 3 % (ref 0–5)
HCT: 47.7 % (ref 39.0–52.0)
Hemoglobin: 16.1 g/dL (ref 13.0–17.0)
Lymphocytes Relative: 26 % (ref 12–46)
Lymphs Abs: 1.7 10*3/uL (ref 0.7–4.0)
MCH: 31.2 pg (ref 26.0–34.0)
MCHC: 33.8 g/dL (ref 30.0–36.0)
MCV: 92.4 fL (ref 78.0–100.0)
Monocytes Absolute: 0.6 10*3/uL (ref 0.1–1.0)
Monocytes Relative: 9 % (ref 3–12)
Neutro Abs: 3.8 10*3/uL (ref 1.7–7.7)
Neutrophils Relative %: 61 % (ref 43–77)
Platelets: 239 10*3/uL (ref 150–400)
RBC: 5.16 MIL/uL (ref 4.22–5.81)
RDW: 13.3 % (ref 11.5–15.5)
WBC: 6.2 10*3/uL (ref 4.0–10.5)

## 2011-04-02 LAB — LIPASE: Lipase: 139 U/L — ABNORMAL HIGH (ref 0–75)

## 2011-04-02 LAB — COMPLETE METABOLIC PANEL WITH GFR
ALT: 31 U/L (ref 0–53)
AST: 23 U/L (ref 0–37)
Albumin: 4.8 g/dL (ref 3.5–5.2)
Alkaline Phosphatase: 55 U/L (ref 39–117)
BUN: 20 mg/dL (ref 6–23)
CO2: 25 mEq/L (ref 19–32)
Calcium: 9.6 mg/dL (ref 8.4–10.5)
Chloride: 103 mEq/L (ref 96–112)
Creat: 0.95 mg/dL (ref 0.50–1.35)
GFR, Est African American: >60 mL/min (ref >60)
GFR, Est Non African American: >60 mL/min (ref >60)
Glucose, Bld: 86 mg/dL (ref 70–99)
Potassium: 4.3 mEq/L (ref 3.5–5.3)
Sodium: 139 mEq/L (ref 135–145)
Total Bilirubin: 0.9 mg/dL (ref 0.3–1.2)
Total Protein: 7 g/dL (ref 6.0–8.3)

## 2011-04-02 LAB — AMYLASE: Amylase: 111 U/L — ABNORMAL HIGH (ref 0–105)

## 2011-04-02 NOTE — Telephone Encounter (Signed)
Labs ordered.

## 2011-04-08 ENCOUNTER — Telehealth: Payer: Self-pay | Admitting: Family Medicine

## 2011-04-08 DIAGNOSIS — K859 Acute pancreatitis without necrosis or infection, unspecified: Secondary | ICD-10-CM

## 2011-04-08 NOTE — Telephone Encounter (Signed)
I called pt with his lab results.  Will get CT abd/ pelvis with contract for continued elevation and rising pancreatic enzymes.

## 2011-04-09 ENCOUNTER — Telehealth: Payer: Self-pay | Admitting: Family Medicine

## 2011-04-09 ENCOUNTER — Ambulatory Visit
Admission: RE | Admit: 2011-04-09 | Discharge: 2011-04-09 | Disposition: A | Discharge status: Home / Self Care | Payer: 59 | Source: Ambulatory Visit | Attending: Family Medicine | Admitting: Family Medicine

## 2011-04-09 DIAGNOSIS — K859 Acute pancreatitis without necrosis or infection, unspecified: Secondary | ICD-10-CM

## 2011-04-09 MED ORDER — IOHEXOL 300 MG/ML  SOLN
100.0000 mL | Freq: Once | INTRAMUSCULAR | Status: AC | PRN
  Administered 2011-04-09: 100 mL via INTRAVENOUS

## 2011-04-09 NOTE — Telephone Encounter (Signed)
Pls let pt know that his CT of the abdomen shows mild acute pancreatitis (inflammation of the pancreas) but NO sign of cancer or mass.  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    I'd like him to stick to a clear liquid diet for the next 72 hrs, then advance slowly to bland foods.  Avoid any alcohol.   Return for office visit/ repeat labs/ check abdominal exam in 1 week.  If pancreatic enzymes are not coming down, I will set him up with GI.

## 2011-04-10 NOTE — Telephone Encounter (Signed)
Pt aware of the above  

## 2011-04-12 ENCOUNTER — Encounter: Payer: Self-pay | Admitting: Family Medicine

## 2011-04-12 DIAGNOSIS — K52839 Microscopic colitis, unspecified: Secondary | ICD-10-CM | POA: Insufficient documentation

## 2011-04-14 ENCOUNTER — Encounter: Payer: Self-pay | Admitting: Family Medicine

## 2011-04-17 ENCOUNTER — Ambulatory Visit (INDEPENDENT_AMBULATORY_CARE_PROVIDER_SITE_OTHER): Payer: 59 | Admitting: Family Medicine

## 2011-04-17 ENCOUNTER — Encounter: Payer: Self-pay | Admitting: Family Medicine

## 2011-04-17 DIAGNOSIS — K5289 Other specified noninfective gastroenteritis and colitis: Secondary | ICD-10-CM

## 2011-04-17 DIAGNOSIS — R799 Abnormal finding of blood chemistry, unspecified: Secondary | ICD-10-CM

## 2011-04-17 DIAGNOSIS — H6690 Otitis media, unspecified, unspecified ear: Secondary | ICD-10-CM

## 2011-04-17 DIAGNOSIS — K52839 Microscopic colitis, unspecified: Secondary | ICD-10-CM

## 2011-04-17 NOTE — Assessment & Plan Note (Signed)
Repeat amylase and lipase today.  Clinically he is much improved and CT abd/ pelvis was essentially normal.

## 2011-04-17 NOTE — Patient Instructions (Signed)
Repeat amylase and lipase today. Will call you w/ results tomorrow.  Stay on probiotic and steroid and f/u with Dr Jason Fila.  OK to use OTC Claritin as needed for ear fullness/ rhinorrhea.  Return for f/u in 3-4 mos.

## 2011-04-17 NOTE — Assessment & Plan Note (Signed)
Resolved.  Ear exam is normal today.  Unfortunately had more diarrhea from the Augmentin which has resolved.  Advised him to stay on the probiotic for now.

## 2011-04-17 NOTE — Assessment & Plan Note (Signed)
Newly diagnosed on colonosocpy with DR Jason Fila.  His stools and energy level have already improved 5 days into treatment with Budesonide.  Continue current treatment plan and f/u with Dr Jason Fila.

## 2011-04-17 NOTE — Progress Notes (Signed)
  Subjective:    Patient ID: Thomas Schroeder, male    DOB: 30-May-1961, 50 y.o.   MRN: [id number]  HPI  50 yo WM presents for f/u visit.  He was seen for another ear infection and then put on Augmentin.  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  His CT of the Abd and pelvis showed some mild inflammation of the pancreas.  His pancreatic enzymes are elevated on labs.  He was started on Budesonide on Sat and this is the first day he's had a formed stool in 3 wks.  Has mild abd pain and is now taking a probiotic.  His energy level is improving.  Appetite is improving.  BP 131/83  Pulse 97  Temp(Src) 98.5 F (36.9 C) (Oral)  Review of Systems  Constitutional: Negative for fever, chills, appetite change and fatigue.  Gastrointestinal: Negative for abdominal pain and abdominal distention.  Genitourinary: Negative for dysuria.       Objective:   Physical Exam  Constitutional: He appears well-developed and well-nourished.  HENT:  Mouth/Throat: Oropharynx is clear and moist.  Eyes: No scleral icterus.  Neck: Neck supple.  Cardiovascular: Normal rate, regular rhythm and normal heart sounds.   Pulmonary/Chest: Effort normal.  Abdominal: Soft. Bowel sounds are normal. He exhibits no distension. There is no tenderness.  Musculoskeletal: He exhibits no edema.  Lymphadenopathy:    He has no cervical adenopathy.  Skin: Skin is warm and dry.  Psychiatric: He has a normal mood and affect.          Assessment & Plan:

## 2011-04-18 ENCOUNTER — Telehealth: Payer: Self-pay | Admitting: Family Medicine

## 2011-04-18 LAB — LIPASE: Lipase: 31 U/L (ref 0–75)

## 2011-04-18 LAB — AMYLASE: Amylase: 46 U/L (ref 0–105)

## 2011-04-18 NOTE — Telephone Encounter (Signed)
Pls fax copy of this to Dr Jason Fila at [facility].  Pls let pt know that his pancreatitic enzymes came back normal which is great news!

## 2011-04-18 NOTE — Telephone Encounter (Signed)
Advised pt of results. Faxed results to Dr [facility].

## 2011-06-05 ENCOUNTER — Ambulatory Visit (INDEPENDENT_AMBULATORY_CARE_PROVIDER_SITE_OTHER): Payer: 59 | Admitting: Family Medicine

## 2011-06-05 ENCOUNTER — Encounter: Payer: Self-pay | Admitting: Family Medicine

## 2011-06-05 VITALS — BP 124/81 | HR 88 | Temp 98.1°F

## 2011-06-05 DIAGNOSIS — H9209 Otalgia, unspecified ear: Secondary | ICD-10-CM

## 2011-06-05 DIAGNOSIS — Z23 Encounter for immunization: Secondary | ICD-10-CM

## 2011-06-05 DIAGNOSIS — R300 Dysuria: Secondary | ICD-10-CM

## 2011-06-05 LAB — POCT URINALYSIS DIPSTICK
Bilirubin, UA: NEGATIVE
Glucose, UA: NEGATIVE
Ketones, UA: NEGATIVE
Nitrite, UA: NEGATIVE
Protein, UA: NEGATIVE
Spec Grav, UA: 1.01
Urobilinogen, UA: 0.2
pH, UA: 7

## 2011-06-05 NOTE — Patient Instructions (Signed)
Call me if your ear symptoms aren't better in about 5 days. Will refer to ENT if needed

## 2011-06-05 NOTE — Progress Notes (Signed)
  Subjective:    Patient ID: Thomas Schroeder, male    DOB: 1960/11/26, 50 y.o.   MRN: [id number]  HPI Left ear pain that started about 5 days ago. Feels blocked and feels sore on the inside and started ringing.  Was tx for infection several months ago. He says has feel a "build up to this" for the last month. He has injury of allergies.  Used to use nasal sprays i nthe spring.  Put honey in his coffee and says this has helped his allergies. No ear drainage or fever.  Has had a lot of post nasal drainage.    UTI - for 2-3 weeks. He says when gets UTI he feels more fatigued.  Also notices sediment in his urine. Normally he changes his catheter once a week. He started noticing increase in sediment about 2 weeks ago. He says he typically doesn't get pain or blood in his urine. No fever.   Review of Systems Patient Active Problem List  Diagnoses  . PARAPLEGIA  . ALLERGIC RHINITIS  . DYSPEPSIA  . UTI  . PRESSURE ULCER BUTTOCK  . OSTEOPENIA  . SKIN RASH  . BLOOD CHEMISTRY, ABNORMAL  . Otitis media  . Microscopic colitis       Objective:   Physical Exam  Constitutional: He is oriented to person, place, and time. He appears well-developed and well-nourished.  HENT:  Head: Normocephalic and atraumatic.  Right Ear: External ear normal.  Left Ear: External ear normal.  Nose: Nose normal.       TMs and canals are clear bilaterally.  Eyes: Conjunctivae are normal. Pupils are equal, round, and reactive to light.  Neurological: He is alert and oriented to person, place, and time.  Skin: Skin is warm and dry.          Assessment & Plan:  Lt otalgia - Will restart nasal steroid spray for his allergies and see if this improves his discomfort in the next couple of days. If he is still having significant discomfort after about 5 days please call our office and I'll refer him to dermis and throat specialty. I do think that his ear pain is likely from eustachian tube dysfunction secondary to his  allergic rhinitis.  Possible UTI-urinalysis is negative. We will send a culture and call him with the results. If He certainly does spike a fever or feels worse than he should call our office immediately or go to the emergency department if needed.

## 2011-06-09 LAB — URINE CULTURE: Colony Count: >=100000

## 2011-06-11 ENCOUNTER — Telehealth: Payer: Self-pay | Admitting: *Deleted

## 2011-06-11 NOTE — Telephone Encounter (Signed)
Message copied by Wyline Beady on Tue Jun 11, 2011 10:13 AM ------      Message from: Nani Gasser D      Created: Mon Jun 10, 2011  7:51 AM       Urine culture grew out a skin bug so was contaminated by skin cells during collection.

## 2011-06-11 NOTE — Telephone Encounter (Signed)
Pt notified and and he is still having sxs

## 2011-06-11 NOTE — Telephone Encounter (Signed)
Let collect another sample. Can he do this on the day he changes his catheter?

## 2011-06-12 NOTE — Telephone Encounter (Signed)
Pt will come in tomorrow to do a u/a after he takes his catheter out

## 2011-06-13 ENCOUNTER — Ambulatory Visit (INDEPENDENT_AMBULATORY_CARE_PROVIDER_SITE_OTHER): Payer: 59 | Admitting: Family Medicine

## 2011-06-13 DIAGNOSIS — R8290 Unspecified abnormal findings in urine: Secondary | ICD-10-CM

## 2011-06-13 DIAGNOSIS — R82998 Other abnormal findings in urine: Secondary | ICD-10-CM

## 2011-06-13 LAB — POCT URINALYSIS DIPSTICK
Bilirubin, UA: NEGATIVE
Glucose, UA: NEGATIVE
Ketones, UA: NEGATIVE
Nitrite, UA: NEGATIVE
Protein, UA: NEGATIVE
Spec Grav, UA: <1.005
Urobilinogen, UA: 0.2
pH, UA: 6.5

## 2011-06-13 NOTE — Progress Notes (Signed)
Pt here for UA- C/O odor to urine and sediment in the urine  The original culture was negative. This he is to collect another sample. He has had some improvement in the sediment in his urine. He still feels fatigued. Still no fevers.

## 2011-06-16 LAB — URINE CULTURE: Colony Count: 8000

## 2011-07-19 ENCOUNTER — Other Ambulatory Visit: Payer: Self-pay | Admitting: Family Medicine

## 2011-07-19 MED ORDER — TACROLIMUS 0.03 % EX OINT: TOPICAL_OINTMENT | Freq: Two times a day (BID) | CUTANEOUS | Status: DC

## 2011-07-19 NOTE — Telephone Encounter (Signed)
Pt called for refill of his protopic 0.1 ointment.  When checked the med is 0.03% ointment and a refill was sent as requested to [facility] [place]. Jarvis Newcomer, LPN [name]

## 2011-09-04 DIAGNOSIS — L8990 Pressure ulcer of unspecified site, unspecified stage: Secondary | ICD-10-CM | POA: Insufficient documentation

## 2011-09-04 DIAGNOSIS — L98491 Non-pressure chronic ulcer of skin of other sites limited to breakdown of skin: Secondary | ICD-10-CM | POA: Insufficient documentation

## 2011-09-20 ENCOUNTER — Encounter: Payer: Self-pay | Admitting: Physician Assistant

## 2011-09-20 ENCOUNTER — Ambulatory Visit (INDEPENDENT_AMBULATORY_CARE_PROVIDER_SITE_OTHER): Payer: 59 | Admitting: Physician Assistant

## 2011-09-20 ENCOUNTER — Ambulatory Visit: Payer: 59 | Admitting: Family Medicine

## 2011-09-20 VITALS — BP 129/84 | HR 101 | Temp 97.9°F

## 2011-09-20 DIAGNOSIS — J069 Acute upper respiratory infection, unspecified: Secondary | ICD-10-CM

## 2011-09-20 MED ORDER — AZITHROMYCIN 250 MG PO TABS: ORAL_TABLET | ORAL | Status: AC

## 2011-09-20 MED ORDER — BENZONATATE 100 MG PO CAPS: 100.0000 mg | ORAL_CAPSULE | Freq: Three times a day (TID) | ORAL | Status: AC | PRN

## 2011-09-20 NOTE — Progress Notes (Signed)
  Subjective:    Patient ID: Thomas Schroeder, male    DOB: November 17, 1960, 51 y.o.   MRN: [id number]  Cough This is a new problem. The current episode started 1 to 4 weeks ago. The problem has been gradually worsening. The problem occurs every few minutes. The cough is productive of sputum (yellow/green). Associated symptoms include chills, ear congestion, ear pain, headaches, nasal congestion, a sore throat, shortness of breath and wheezing. Pertinent negatives include no chest pain, fever, heartburn, hemoptysis, myalgias, postnasal drip or rash. He has tried OTC cough suppressant for the symptoms. The treatment provided mild relief. There is no history of asthma or bronchitis.   7 days congested and coughing that has progressed to wheezing and SOB.  Robotussin DM some helped minimally. Delsym has helped the most..    Review of Systems  Constitutional: Positive for chills. Negative for fever.  HENT: Positive for ear pain and sore throat. Negative for postnasal drip.   Respiratory: Positive for cough, shortness of breath and wheezing. Negative for hemoptysis.   Cardiovascular: Negative for chest pain.  Gastrointestinal: Negative for heartburn.  Musculoskeletal: Negative for myalgias.  Skin: Negative for rash.  Neurological: Positive for headaches.       Objective:   Physical Exam  Constitutional: He is oriented to person, place, and time. He appears well-developed and well-nourished. No distress.  HENT:  Head: Normocephalic and atraumatic.  Right Ear: External ear normal.  Left Ear: External ear normal.  Nose: Nose normal.  Mouth/Throat: Oropharynx is clear and moist. No oropharyngeal exudate.       No maxillary tenderness.  Eyes: Right eye exhibits no discharge. Left eye exhibits no discharge.  Cardiovascular: Normal rate, regular rhythm and normal heart sounds.   Pulmonary/Chest: Effort normal. No respiratory distress. He has wheezes. He has no rales. He exhibits no tenderness.   Wheezing in bilateral lung fields throughout.  Lymphadenopathy:    He has no cervical adenopathy.  Neurological: He is alert and oriented to person, place, and time.  Skin: He is not diaphoretic.       Flushed face in the maxillary area bilaterally.   Psychiatric: He has a normal mood and affect. His behavior is normal.          Assessment & Plan:  Upper Respiratory Infection- Tessalon 100mg  TID for cough. Continue Delsym if helps. Zpak to start. I wanted to prescribe on the side of caution due to him being in a wheelchair and having more of a sedentary lifestyle. Symptomatic care was give along with handout on cough. Follow up or call if wheezing/SOB gets worse.

## 2011-09-20 NOTE — Patient Instructions (Signed)
Start Zpak and using Tessalon for cough. F/u if SOB or wheezing is not improving.   Cough, Adult  A cough is a reflex that helps clear your throat and airways. It can help heal the body or may be a reaction to an irritated airway. A cough may only last 2 or 3 weeks (acute) or may last more than 8 weeks (chronic).  CAUSES Acute cough:  Viral or bacterial infections.  Chronic cough:  Infections.   Allergies.   Asthma.   Post-nasal drip.   Smoking.   Heartburn or acid reflux.   Some medicines.   Chronic lung problems (COPD).   Cancer.  SYMPTOMS   Cough.   Fever.   Chest pain.   Increased breathing rate.   High-pitched whistling sound when breathing (wheezing).   Colored mucus that you cough up (sputum).  TREATMENT   A bacterial cough may be treated with antibiotic medicine.   A viral cough must run its course and will not respond to antibiotics.   Your caregiver may recommend other treatments if you have a chronic cough.  HOME CARE INSTRUCTIONS   Only take over-the-counter or prescription medicines for pain, discomfort, or fever as directed by your caregiver. Use cough suppressants only as directed by your caregiver.   Use a cold steam vaporizer or humidifier in your bedroom or home to help loosen secretions.   Sleep in a semi-upright position if your cough is worse at night.   Rest as needed.   Stop smoking if you smoke.  SEEK IMMEDIATE MEDICAL CARE IF:   You have pus in your sputum.   Your cough starts to worsen.   You cannot control your cough with suppressants and are losing sleep.   You begin coughing up blood.   You have difficulty breathing.   You develop pain which is getting worse or is uncontrolled with medicine.   You have a fever.  MAKE SURE YOU:   Understand these instructions.   Will watch your condition.   Will get help right away if you are not doing well or get worse.  Document Released: 03/01/2011 Document Revised:  05/15/2011 Document Reviewed: 03/01/2011 [facility] Patient Information 2012 [place], [place].

## 2011-09-26 ENCOUNTER — Ambulatory Visit: Payer: 59 | Admitting: Family Medicine

## 2011-10-08 ENCOUNTER — Other Ambulatory Visit: Payer: Self-pay | Admitting: *Deleted

## 2011-10-08 MED ORDER — TRIMETHOPRIM 100 MG PO TABS: 100.0000 mg | ORAL_TABLET | Freq: Every day | ORAL | Status: DC

## 2011-11-07 ENCOUNTER — Ambulatory Visit (INDEPENDENT_AMBULATORY_CARE_PROVIDER_SITE_OTHER): Payer: 59 | Admitting: Family Medicine

## 2011-11-07 ENCOUNTER — Encounter: Payer: Self-pay | Admitting: Family Medicine

## 2011-11-07 VITALS — BP 121/77 | HR 106 | Temp 97.7°F

## 2011-11-07 DIAGNOSIS — G8220 Paraplegia, unspecified: Secondary | ICD-10-CM

## 2011-11-07 MED ORDER — AMBULATORY NON FORMULARY MEDICATION: Status: DC

## 2011-11-07 NOTE — Progress Notes (Signed)
  Subjective:    Patient ID: Thomas Schroeder, male    DOB: 06-30-1961, 51 y.o.   MRN: [id number]  HPI He is here to discuss having forms completed for his flexible savings account. Unfortunately he left the forms at home but will leave them up front. He has been getting massages intermittently for his back and muscle tension. He says it helped significantly with his spasms in his low back from his paraplegia. He would also like a prescription for some of his Foley supplies to be covered including drainage bags cleaner, gloves, Foley catheters.  He also plans to get a second set of titers for his wheelchair and would like a prescription for that as well.  Otherwise he is doing well. He denied any refills on medications today. His colonoscopy is up-to-date.   Review of Systems     Objective:   Physical Exam  Constitutional: He is oriented to person, place, and time. He appears well-developed and well-nourished.  HENT:  Head: Normocephalic and atraumatic.  Cardiovascular: Normal rate, regular rhythm and normal heart sounds.   Pulmonary/Chest: Effort normal and breath sounds normal.  Neurological: He is alert and oriented to person, place, and time.  Skin: Skin is warm and dry.  Psychiatric: He has a normal mood and affect. His behavior is normal.          Assessup X. prescriptions for him. He can take them off at the same time that he picks up the forms. He will drop those off layers of which helped his massages covered. I think this is fantastic for the spasticity.ment & Plan:  Paraplegia - We wil be happy to provide his rx. I am happy that the massage is reallly helping his spasms.  We will complete forms once dropped off.   He is due for Tdap.  Can give at next appt.  We forgot to give today.

## 2011-11-18 ENCOUNTER — Ambulatory Visit (INDEPENDENT_AMBULATORY_CARE_PROVIDER_SITE_OTHER): Payer: 59 | Admitting: Family Medicine

## 2011-11-18 ENCOUNTER — Encounter: Payer: Self-pay | Admitting: Family Medicine

## 2011-11-18 VITALS — BP 144/91 | HR 111 | Temp 97.8°F

## 2011-11-18 DIAGNOSIS — L89309 Pressure ulcer of unspecified buttock, unspecified stage: Secondary | ICD-10-CM

## 2011-11-18 DIAGNOSIS — L89302 Pressure ulcer of unspecified buttock, stage 2: Secondary | ICD-10-CM

## 2011-11-18 DIAGNOSIS — L8992 Pressure ulcer of unspecified site, stage 2: Secondary | ICD-10-CM

## 2011-11-18 MED ORDER — AMBULATORY NON FORMULARY MEDICATION: Status: DC

## 2011-11-18 NOTE — Progress Notes (Signed)
  Subjective:    Patient ID: Thomas Schroeder, male    DOB: 1961/05/13, 51 y.o.   MRN: [id number]  HPI Patient is here today for a new lesion on his left buttock cheek. He is a paraplegic and is wheelchair-bound. He's been traveling on a plane last couple weeks for work. He's not sure if he got an abrasion to the area while trying to shift out of his seat on the air plane.  Says noticed it in the mirror.  He would specifically like "shapes" cushions. They stay on for 2-3 days at a time like duoderm. He has used them before. Also will need note for restrictions for work to allow it to heal.    Review of Systems     Objective:   Physical Exam  Skin:       He has a stage 1-2 decub, It is bullous  On the right inner buttock cheek. NO active drainage or surrounding erythema. About 1 cm in size.    He does have an area on the right buttock cheek that is well healed where is apparent that he had a previous skin graft.       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 Given rx for the "shape" cushions.  Call if not resolved in 2 weeks. 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  He has not traveled for 2-3 weeks on an airplane. Also noted sitting more than 4 hours at a time with at least 1 hour break per day. Also to be home, to be able to work from home for the next week so that he can get some pressure off the area not heal. 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

## 2011-11-27 ENCOUNTER — Other Ambulatory Visit: Payer: Self-pay | Admitting: *Deleted

## 2011-11-27 MED ORDER — AMBULATORY NON FORMULARY MEDICATION: Status: DC

## 2012-01-07 ENCOUNTER — Telehealth: Payer: Self-pay | Admitting: *Deleted

## 2012-01-07 ENCOUNTER — Other Ambulatory Visit: Payer: Self-pay | Admitting: *Deleted

## 2012-01-07 NOTE — Telephone Encounter (Signed)
Pt states that he thinks his neurologist wrote for the last rx so he ask for me not to send that rx yet unitl he calls them.

## 2012-01-07 NOTE — Telephone Encounter (Signed)
OK to fill until seen. jUst clal pharm to verify does, quant.

## 2012-01-07 NOTE — Telephone Encounter (Signed)
Who orig wrote rx?  What was it in old computer syste?

## 2012-01-07 NOTE — Telephone Encounter (Signed)
In the old system it is written for 5mg  1-2 tabs by mouth daily as needed. You wrote an rx 03/06/2009 for 60 tabs and then Dr. Cathey Endow wrote for 120 tabs on 03/13/2009.

## 2012-01-07 NOTE — Telephone Encounter (Signed)
Okay to write for 5 mg tab. 1-2 tabs by mouth daily as needed. #120, which is a 90 day supply, with one refill.

## 2012-01-07 NOTE — Telephone Encounter (Signed)
Pt has requested a refill on diazepam to [facility] but pt states that on his bottle that it was 10 mg tabs, 90 day supply with refills for a year. I see the 5mg  in system. Please advise.

## 2012-01-07 NOTE — Telephone Encounter (Signed)
Pt states the neurologist that prescribed the last refill is no longer there. Pt states that he is trying to get an appt with them but would like to know if you will still fill the rx until he can get seen. Please advise if I can proceed.

## 2012-01-08 ENCOUNTER — Other Ambulatory Visit: Payer: Self-pay | Admitting: *Deleted

## 2012-01-08 MED ORDER — DIAZEPAM 10 MG PO TABS: 10.0000 mg | ORAL_TABLET | Freq: Four times a day (QID) | ORAL | Status: AC | PRN

## 2012-01-08 NOTE — Telephone Encounter (Signed)
Pt notified and LM for pt to call back with the last pharmacy he had filled at so we can verify before filling again. KJ LPN

## 2012-03-13 ENCOUNTER — Other Ambulatory Visit: Payer: Self-pay | Admitting: Family Medicine

## 2012-08-07 ENCOUNTER — Telehealth: Payer: Self-pay | Admitting: *Deleted

## 2012-08-07 NOTE — Telephone Encounter (Signed)
Pt calls and needs a rx for vehicle hand controls.Pt is a paraplegic. Will pick up when ready- call

## 2012-08-10 MED ORDER — AMBULATORY NON FORMULARY MEDICATION: Status: DC

## 2012-08-10 NOTE — Telephone Encounter (Signed)
rx printed

## 2012-08-10 NOTE — Telephone Encounter (Signed)
Pt.notified

## 2012-08-20 ENCOUNTER — Encounter: Payer: Self-pay | Admitting: Family Medicine

## 2012-08-20 ENCOUNTER — Ambulatory Visit (INDEPENDENT_AMBULATORY_CARE_PROVIDER_SITE_OTHER): Payer: 59 | Admitting: Family Medicine

## 2012-08-20 VITALS — BP 103/66 | HR 72 | Ht 66.0 in | Wt 160.0 lb

## 2012-08-20 DIAGNOSIS — Z0000 Encounter for general adult medical examination without abnormal findings: Secondary | ICD-10-CM

## 2012-08-20 LAB — COMPLETE METABOLIC PANEL WITH GFR
ALT: 28 U/L (ref 0–53)
AST: 20 U/L (ref 0–37)
Albumin: 4.6 g/dL (ref 3.5–5.2)
Alkaline Phosphatase: 56 U/L (ref 39–117)
BUN: 18 mg/dL (ref 6–23)
CO2: 27 mEq/L (ref 19–32)
Calcium: 9.4 mg/dL (ref 8.4–10.5)
Chloride: 105 mEq/L (ref 96–112)
Creat: 0.89 mg/dL (ref 0.50–1.35)
GFR, Est African American: >89 mL/min
GFR, Est Non African American: >89 mL/min
Glucose, Bld: 90 mg/dL (ref 70–99)
Potassium: 4 mEq/L (ref 3.5–5.3)
Sodium: 138 mEq/L (ref 135–145)
Total Bilirubin: 0.7 mg/dL (ref 0.3–1.2)
Total Protein: 6.7 g/dL (ref 6.0–8.3)

## 2012-08-20 LAB — PSA: PSA: 0.84 ng/mL (ref <=4.00)

## 2012-08-20 LAB — LIPID PANEL
Cholesterol: 216 mg/dL — ABNORMAL HIGH (ref 0–200)
HDL: 43 mg/dL (ref >39)
LDL Cholesterol: 130 mg/dL — ABNORMAL HIGH (ref 0–99)
Total CHOL/HDL Ratio: 5 Ratio
Triglycerides: 215 mg/dL — ABNORMAL HIGH (ref <150)
VLDL: 43 mg/dL — ABNORMAL HIGH (ref 0–40)

## 2012-08-20 MED ORDER — TRIMETHOPRIM 100 MG PO TABS: 100.0000 mg | ORAL_TABLET | Freq: Every day | ORAL | Status: DC

## 2012-08-20 NOTE — Patient Instructions (Addendum)
Keep up a regular exercise program and make sure you are eating a healthy diet Try to eat 4 servings of dairy a day, or if you are lactose intolerant take a calcium with vitamin D daily.  Your vaccines are up to date.   

## 2012-08-20 NOTE — Addendum Note (Signed)
Addended by: Nani Gasser D on: 08/20/2012 09:46 AM   Modules accepted: Orders

## 2012-08-20 NOTE — Progress Notes (Signed)
Subjective:    Patient ID: Thomas Schroeder, male    DOB: 03/19/1961, 51 y.o.   MRN: [id number]  HPI  CPE - Here for CPE.  Updated fam hx.  No complaints. Doing well.  No problems with wound care at this time. H is a paraplegic.   Review of Systems Comprehensive ROS is negative.  BP 103/66  Pulse 72  Ht 5\' 6"  (1.676 m)  Wt 160 lb (72.576 kg)  BMI 25.82 kg/m2    Allergies  Allergen Reactions  . Ciprofloxacin     REACTION: diarrhea  . Bactrim Rash    Past Medical History  Diagnosis Date  . Allergy   . Chronic kidney disease   . Chronic indwelling foley catheter   . Night muscle spasms   . ED (erectile dysfunction)   . History of recurrent UTIs   . Paraplegia     Past Surgical History  Procedure Date  . T8 t9 fusion    . Femur fracture surgery   . Appendectomy   . Kidney stone removal   . Rt tibia fracture     History   Social History  . Marital Status: Divorced    Spouse Name: N/A    Number of Children: 2  . Years of Education: N/A   Occupational History  .     Social History Main Topics  . Smoking status: Never Smoker   . Smokeless tobacco: Not on file  . Alcohol Use: 6.0 oz/week    12 drink(s) per week     Comment: per week  . Drug Use: Not on file  . Sexually Active: Yes   Other Topics Concern  . Not on file   Social History Narrative   2 caffeine drinks per day.      Family History  Problem Relation Age of Onset  . Hyperlipidemia Mother   . Diabetes Mother   . Rosacea Mother   . Hypertension Neg Hx   . Lung cancer Mother     smoker    Outpatient Encounter Prescriptions as of 08/20/2012  Medication Sig Dispense Refill  . AMBULATORY NON [facility] MEDICATION BArd Bedside Drianage Bag- 2 Hook Hanger - 2000cc [phone]): Qty 5 Invacore Powder-free Vinyl Exam Glvoes - Large : Qty 5 Bardia 100% Silicone Foley Catheter- Sterile, 2way, 16Fr, 5cc: Qty: 5 Alpine Fresh cleanser- Qty:48  5 each  11  . AMBULATORY NON FORMULARY MEDICATION  Medication Name: Rubber wheels for wheelchair Dx: Paraplegia.  1 Units  0  . AMBULATORY NON FORMULARY MEDICATION Shapes by PolyMen, #3 Oval Dx decubitis Ulcer  20 patch  1  . AMBULATORY NON FORMULARY MEDICATION Medication Name:vehicle hand controls.   Dx Paraplegia.  1 Units  0  . Ascorbic Acid (VITAMIN C) 100 MG tablet Take 100 mg by mouth daily.        Marland Kitchen aspirin 325 MG tablet Take 325 mg by mouth daily.        Marland Kitchen b complex vitamins tablet Take 1 tablet by mouth daily.        . budesonide (ENTOCORT EC) 3 MG 24 hr capsule Take 9 mg by mouth every morning.        Jennette Banker Sodium 30-100 MG CAPS Take by mouth.        Tilman Neat EXTRACT PO Take by mouth.        . diazepam (VALIUM) 5 MG tablet       . Multiple Vitamin (MULTI VITAMIN MENS PO) Take by mouth.        Marland Kitchen  tacrolimus (PROTOPIC) 0.03 % ointment Apply topically 2 (two) times daily.  100 g  1  . trimethoprim (TRIMPEX) 100 MG tablet TAKE 1 TABLET DAILY  90 tablet  0  . vitamin E 100 UNIT capsule Take 100 Units by mouth daily.                Objective:   Physical Exam  Constitutional: He is oriented to person, place, and time. He appears well-developed and well-nourished.  HENT:  Head: Normocephalic and atraumatic.  Right Ear: External ear normal.  Left Ear: External ear normal.  Nose: Nose normal.  Mouth/Throat: Oropharynx is clear and moist.  Eyes: Conjunctivae normal and EOM are normal. Pupils are equal, round, and reactive to light.  Neck: Normal range of motion. Neck supple. No thyromegaly present.  Cardiovascular: Normal rate, regular rhythm, normal heart sounds and intact distal pulses.   Pulmonary/Chest: Effort normal and breath sounds normal.  Abdominal: Soft. Bowel sounds are normal. He exhibits no distension and no mass. There is no tenderness. There is no rebound and no guarding.  Genitourinary: Rectum normal. Guaiac negative stool.       Prostate is symmetric, mildly enlarged.   Musculoskeletal:        UE with NROM. LE his is unable to move on his own.   Lymphadenopathy:    He has no cervical adenopathy.  Neurological: He is alert and oriented to person, place, and time. He has normal reflexes. No cranial nerve deficit.  Skin: Skin is warm and dry.  Psychiatric: He has a normal mood and affect. His behavior is normal. Judgment and thought content normal.          Assessment & Plan:  Keep up a regular exercise program and make sure you are eating a healthy diet Try to eat 4 servings of dairy a day, or if you are lactose intolerant take a calcium with vitamin D daily.  Your vaccines are up to date.  Due for CMP, lipoids, PSA.   Flu vaccine at work.

## 2012-09-25 ENCOUNTER — Encounter: Payer: Self-pay | Admitting: Family Medicine

## 2012-09-25 ENCOUNTER — Ambulatory Visit (INDEPENDENT_AMBULATORY_CARE_PROVIDER_SITE_OTHER): Payer: 59 | Admitting: Family Medicine

## 2012-09-25 VITALS — BP 131/83 | HR 104 | Temp 97.7°F

## 2012-09-25 DIAGNOSIS — J208 Acute bronchitis due to other specified organisms: Secondary | ICD-10-CM

## 2012-09-25 DIAGNOSIS — J209 Acute bronchitis, unspecified: Secondary | ICD-10-CM

## 2012-09-25 DIAGNOSIS — B9689 Other specified bacterial agents as the cause of diseases classified elsewhere: Secondary | ICD-10-CM

## 2012-09-25 DIAGNOSIS — J329 Chronic sinusitis, unspecified: Secondary | ICD-10-CM

## 2012-09-25 DIAGNOSIS — A499 Bacterial infection, unspecified: Secondary | ICD-10-CM

## 2012-09-25 MED ORDER — DOXYCYCLINE HYCLATE 100 MG PO TABS: ORAL_TABLET | ORAL | Status: AC

## 2012-09-25 NOTE — Progress Notes (Signed)
CC: Thomas Schroeder is a 52 y.o. male is here for Nasal Congestion   Subjective: HPI:  Patient reports one week of facial pressure. Is located below the eyes bilaterally. Moderate in severity and worsening on a daily basis. Slightly improved with over-the-counter decongestant but only lasts for one to 2 hours at a time. Associated with a slightly productive cough is getting no better and no worse since starting 5 days ago. Associated with green nasal discharge that's getting worse on a daily basis. Had chills last night and fatigue over the last week which prompted today's visit. Denies motor or sensory disturbances, shortness of breath, fevers, confusion, blood sputum, chest pain, abdominal pain.    Review Of Systems Outlined In HPI  Past Medical History  Diagnosis Date  . Allergy   . Chronic kidney disease   . Chronic indwelling foley catheter   . Night muscle spasms   . ED (erectile dysfunction)   . History of recurrent UTIs   . Paraplegia      Family History  Problem Relation Age of Onset  . Hyperlipidemia Mother   . Diabetes Mother   . Rosacea Mother   . Hypertension Neg Hx   . Lung cancer Mother     smoker     History  Substance Use Topics  . Smoking status: Never Smoker   . Smokeless tobacco: Not on file  . Alcohol Use: 6.0 oz/week    12 drink(s) per week     Comment: per week     Objective: Filed Vitals:   09/25/12 0916  BP: 131/83  Pulse: 104  Temp: 97.7 F (36.5 C)    General: Alert and Oriented, No Acute Distress HEENT: Pupils equal, round, reactive to light. Conjunctivae clear.  External ears unremarkable, canals clear with intact TMs with appropriate landmarks.  Middle ear appears open without effusion. Moderately boggy and erythematous inferior turbinates with mucoid discharge.  Moist mucous membranes, pharynx without inflammation nor lesions.  Neck supple without palpable lymphadenopathy nor abnormal masses. Lungs: Clear to auscultation bilaterally, no  wheezing/ronchi/rales.  Comfortable work of breathing. Good air movement. Cardiac: Regular rate and rhythm. Normal S1/S2.  No murmurs, rubs, nor gallops.   Skin: Warm and dry.  Assessment & Plan: Thomas Schroeder was seen today for nasal congestion.  Diagnoses and associated orders for this visit:  Bacterial sinusitis - doxycycline (VIBRA-TABS) 100 MG tablet; One by mouth twice a day for ten days.  Viral bronchitis    Bacterial sinusitis: Given duration of symptoms start doxycycline, discussed nasal saline washes to add to over-the-counter decongestants Viral bronchitis: Counseled on unlikely any significant bacterial involvement of the lower airways however doxycycline will provide some coverage. Expect self resolution of cough  Return if symptoms worsen or fail to improve.

## 2012-10-02 ENCOUNTER — Encounter: Payer: Self-pay | Admitting: Physician Assistant

## 2012-10-02 ENCOUNTER — Ambulatory Visit (INDEPENDENT_AMBULATORY_CARE_PROVIDER_SITE_OTHER): Payer: 59 | Admitting: Physician Assistant

## 2012-10-02 VITALS — BP 138/79 | HR 101 | Temp 97.7°F

## 2012-10-02 DIAGNOSIS — N319 Neuromuscular dysfunction of bladder, unspecified: Secondary | ICD-10-CM | POA: Insufficient documentation

## 2012-10-02 DIAGNOSIS — N529 Male erectile dysfunction, unspecified: Secondary | ICD-10-CM | POA: Insufficient documentation

## 2012-10-02 DIAGNOSIS — J45909 Unspecified asthma, uncomplicated: Secondary | ICD-10-CM

## 2012-10-02 MED ORDER — ALBUTEROL SULFATE HFA 108 (90 BASE) MCG/ACT IN AERS: 2.0000 | INHALATION_SPRAY | Freq: Four times a day (QID) | RESPIRATORY_TRACT | Status: DC | PRN

## 2012-10-02 MED ORDER — PREDNISONE 20 MG PO TABS: 20.0000 mg | ORAL_TABLET | Freq: Two times a day (BID) | ORAL | Status: DC

## 2012-10-02 MED ORDER — AZITHROMYCIN 250 MG PO TABS: ORAL_TABLET | ORAL | Status: DC

## 2012-10-02 NOTE — Progress Notes (Signed)
  Subjective:    Patient ID: Thomas Schroeder, male    DOB: [birth year], 52 y.o.   MRN: [id number]  HPI Patient is a very pleasant 52 year old male who presents to the clinic because he continues to feel very fatigued and is coughing a lot. He reports he is: Home 14 days of not feeling well. He saw Dr.Hommel last week and was diagnosed with bacterial sinusitis and viral bronchitis. He was given doxycycline. He: The last 2 or 3 days and has not felt any better. His cough has moved from being productive to nonproductive. Cough is worse in the morning and midday. He is very fatigued. By 3:00 he feels like he cannot continue to work. He has not been on any adjunct over-the-counter medication other than the doxycycline. He denies any fever. His chest is very tight and he is short of breath. Patient is a paraplegic but he is very active and he has not been able to do his daily physical therapy. He denies a history of any asthma or pneumonia. He does have chills and occasional sweats. He denies any sore throat but his ears have a lot of pressure behind them. He denies any drainage coming from years.    Review of Systems     Objective:   Physical Exam  Constitutional: He is oriented to person, place, and time. He appears well-developed and well-nourished.  HENT:  Head: Normocephalic and atraumatic.  Right Ear: External ear normal.  Left Ear: External ear normal.  Nose: Nose normal.  Mouth/Throat: Oropharynx is clear and moist. No oropharyngeal exudate.       TMs are bilaterally clear. No blood or pus found in canal or behind TM. Ossicles are able to be viewed in a positive light reflex. There is some erythema around the outside of TM. There is no tenderness to palpation of the tragus.  Negative for maxillary tenderness to palpation.  Eyes: Conjunctivae normal are normal.  Neck: Normal range of motion. Neck supple.  Cardiovascular: Regular rhythm and normal heart sounds.        Tachycardic at 101    Pulmonary/Chest: Effort normal. He exhibits tenderness.       I. do not hear any wheezing in either lung field. Breath sounds seem to be full throughout both lung.  Lymphadenopathy:    He has no cervical adenopathy.  Neurological: He is alert and oriented to person, place, and time.  Skin: Skin is warm and dry.  Psychiatric: He has a normal mood and affect.          Assessment & Plan:  Bronchitis, asthmatic- peak flow today at his highest was 390 which was at the top of yellow zone. I did not give albuterol nebulizer today but I did give patient albuterol inhaler to use once he got home. Encouraged him to take twice a day for the next couple days and then as needed for shortness of breath or cough. Did get a prednisone burst for 5 days. Printed out a prescription for Z-Pak if fever occurs or production comes back. Reassured patient that I think there is a lot of inflammation in his chest and ears from sickness. Will try to contact inflammation with this therapy. Call office if not improving in the next couple days. Encouraged patient to try and cold care for cough 3 liquid drops on tongue 3 times a day.

## 2012-10-02 NOTE — Patient Instructions (Addendum)
Start Prednisone for 5 days.  Use albuterol inhaler as needed for SOB/Cough.   Only use zpak if not improving by Sunday.   Call Monday if no better.    [place] cold care.     Bronchitis Bronchitis is the body's way of reacting to injury and/or infection (inflammation) of the bronchi. Bronchi are the air tubes that extend from the windpipe into the lungs. If the inflammation becomes severe, it may cause shortness of breath. CAUSES  Inflammation may be caused by:  A virus.  Germs (bacteria).  Dust.  Allergens.  Pollutants and many other irritants. The cells lining the bronchial tree are covered with tiny hairs (cilia). These constantly beat upward, away from the lungs, toward the mouth. This keeps the lungs free of pollutants. When these cells become too irritated and are unable to do their job, mucus begins to develop. This causes the characteristic cough of bronchitis. The cough clears the lungs when the cilia are unable to do their job. Without either of these protective mechanisms, the mucus would settle in the lungs. Then you would develop pneumonia. Smoking is a common cause of bronchitis and can contribute to pneumonia. Stopping this habit is the single most important thing you can do to help yourself. TREATMENT   Your caregiver may prescribe an antibiotic if the cough is caused by bacteria. Also, medicines that open up your airways make it easier to breathe. Your caregiver may also recommend or prescribe an expectorant. It will loosen the mucus to be coughed up. Only take over-the-counter or prescription medicines for pain, discomfort, or fever as directed by your caregiver.  Removing whatever causes the problem (smoking, for example) is critical to preventing the problem from getting worse.  Cough suppressants may be prescribed for relief of cough symptoms.  Inhaled medicines may be prescribed to help with symptoms now and to help prevent problems from returning.  For those  with recurrent (chronic) bronchitis, there may be a need for steroid medicines. SEEK IMMEDIATE MEDICAL CARE IF:   During treatment, you develop more pus-like mucus (purulent sputum).  You have a fever.  Your baby is older than 3 months with a rectal temperature of 102 F (38.9 C) or higher.  Your baby is 39 months old or younger with a rectal temperature of 100.4 F (38 C) or higher.  You become progressively more ill.  You have increased difficulty breathing, wheezing, or shortness of breath. It is necessary to seek immediate medical care if you are elderly or sick from any other disease. MAKE SURE YOU:   Understand these instructions.  Will watch your condition.  Will get help right away if you are not doing well or get worse. Document Released: 09/02/2005 Document Revised: 11/25/2011 Document Reviewed: 07/12/2008 [facility] Patient Information 2013 [place], [place].

## 2012-10-12 ENCOUNTER — Ambulatory Visit (INDEPENDENT_AMBULATORY_CARE_PROVIDER_SITE_OTHER): Payer: 59 | Admitting: Family Medicine

## 2012-10-12 ENCOUNTER — Encounter: Payer: Self-pay | Admitting: Family Medicine

## 2012-10-12 VITALS — BP 134/88 | HR 112 | Temp 97.5°F

## 2012-10-12 DIAGNOSIS — H6980 Other specified disorders of Eustachian tube, unspecified ear: Secondary | ICD-10-CM

## 2012-10-12 DIAGNOSIS — H9319 Tinnitus, unspecified ear: Secondary | ICD-10-CM

## 2012-10-12 MED ORDER — FLUTICASONE PROPIONATE 50 MCG/ACT NA SUSP: 2.0000 | Freq: Every day | NASAL | Status: DC

## 2012-10-12 NOTE — Patient Instructions (Addendum)
Recommend claritin, allegra, or zyrtec once a day Flonase - 2 squirts in each nostril once a day.  Call if not better in 2 weeks.

## 2012-10-12 NOTE — Progress Notes (Signed)
  Subjective:    Patient ID: Thomas Schroeder, male    DOB: [birth year], 52 y.o.   MRN: [id number]  HPI OM, bilat x 2 weeks- stabbing pain more in left ear. tried some OTC not helping to dry up. checked both ears and they ar red. Dx with bronchitis 10 days ago.  Says his left felt plugged then.  Has been using OTC tx as well. Was using nasal spray and decongetant.  Has had prednisone and zpack. Completed about 5 days ago.  Say warm compressed helped his left ear.  Chest sxs are resolved.  Says hearing is a little better in the left ear that it was.  Says constant achy will occ sharp pain. Some ringing as well.     Review of Systems     Objective:   Physical Exam  Constitutional: He is oriented to person, place, and time. He appears well-developed and well-nourished.  HENT:  Head: Normocephalic and atraumatic.  Right Ear: External ear normal.  Left Ear: External ear normal.  Nose: Nose normal.       TMs clear bilaterally   Neurological: He is alert and oriented to person, place, and time.  Skin: Skin is warm and dry.  Psychiatric: He has a normal mood and affect. His behavior is normal.          Assessment & Plan:  Otalgia - Exam on normal.  He has had 2 rounds of antibiotics and his ear exam is normal today. Most likely eustachian tube dysfunction. Will treat with antihistamine and nasal steroid spray. Call if not significantly better in 2 weeks. Her call back if feels he suddenly getting worse, has significant pain, here drainage, or fever.  Tinnitus - Call if not better in 2 weeks.

## 2012-11-27 ENCOUNTER — Other Ambulatory Visit: Payer: 59

## 2012-11-27 ENCOUNTER — Ambulatory Visit (INDEPENDENT_AMBULATORY_CARE_PROVIDER_SITE_OTHER): Payer: 59 | Admitting: Family Medicine

## 2012-11-27 DIAGNOSIS — R82998 Other abnormal findings in urine: Secondary | ICD-10-CM

## 2012-11-27 DIAGNOSIS — R8290 Unspecified abnormal findings in urine: Secondary | ICD-10-CM

## 2012-11-27 LAB — POCT URINALYSIS DIPSTICK
Bilirubin, UA: NEGATIVE
Glucose, UA: NEGATIVE
Ketones, UA: NEGATIVE
Nitrite, UA: NEGATIVE
Protein, UA: 100
Spec Grav, UA: <=1.005
Urobilinogen, UA: 0.2
pH, UA: 7

## 2012-11-27 NOTE — Addendum Note (Signed)
Addended by: Barry Dienes A on: 11/27/2012 12:17 PM   Modules accepted: Orders

## 2012-11-27 NOTE — Progress Notes (Addendum)
  Subjective:    Patient ID: Thomas Schroeder, male    DOB: [birth year], 52 y.o.   MRN: [id number] ? UTI HPI    Review of Systems     Objective:   Physical Exam        Assessment & Plan:  UA + will send for culture, though he is a paraplegic and his chronic cath.   Nani Gasser, MD Pt notified and wants to know if you want to treat him. Barry Dienes, LPN

## 2012-11-29 LAB — URINE CULTURE: Colony Count: 15000

## 2012-11-30 ENCOUNTER — Encounter: Payer: Self-pay | Admitting: Family Medicine

## 2012-11-30 ENCOUNTER — Ambulatory Visit (INDEPENDENT_AMBULATORY_CARE_PROVIDER_SITE_OTHER): Payer: 59 | Admitting: Family Medicine

## 2012-11-30 VITALS — BP 116/72 | HR 84 | Temp 97.9°F

## 2012-11-30 DIAGNOSIS — R141 Gas pain: Secondary | ICD-10-CM

## 2012-11-30 DIAGNOSIS — R82998 Other abnormal findings in urine: Secondary | ICD-10-CM

## 2012-11-30 DIAGNOSIS — R140 Abdominal distension (gaseous): Secondary | ICD-10-CM

## 2012-11-30 DIAGNOSIS — R143 Flatulence: Secondary | ICD-10-CM

## 2012-11-30 DIAGNOSIS — R5383 Other fatigue: Secondary | ICD-10-CM

## 2012-11-30 DIAGNOSIS — R195 Other fecal abnormalities: Secondary | ICD-10-CM

## 2012-11-30 DIAGNOSIS — R5381 Other malaise: Secondary | ICD-10-CM

## 2012-11-30 DIAGNOSIS — R8290 Unspecified abnormal findings in urine: Secondary | ICD-10-CM

## 2012-11-30 MED ORDER — CEPHALEXIN 500 MG PO CAPS: 500.0000 mg | ORAL_CAPSULE | Freq: Three times a day (TID) | ORAL | Status: DC

## 2012-11-30 MED ORDER — ONDANSETRON 8 MG PO TBDP: 8.0000 mg | ORAL_TABLET | Freq: Three times a day (TID) | ORAL | Status: AC | PRN

## 2012-11-30 NOTE — Addendum Note (Signed)
Addended by: Deno Etienne on: 11/30/2012 05:30 PM   Modules accepted: Orders

## 2012-11-30 NOTE — Progress Notes (Signed)
  Subjective:    Patient ID: Thomas Schroeder, male    DOB: November 15, 1960, 52 y.o.   MRN: [id number]  HPI pt was told that urine Cx was neg however he continues to have sxs assoc with UTI . he stated that there is a normal sediment that he has in his cath but this is different from what he is use to.  Has noticed a change for at least 2 weeks. also in the past when he has had UTI's he has exp prolonged loose stools which he has had for the past 3 weeks he has taken imodium that has not worked for him. No fever or low back pain. Last UTI was before 2012.  Was treated with zpack in Jan for URI sxs.  Has been more gassy than usual.  Says stools are a different color. No stool incontinence.  Feels bloated. He denies any major dietary changes. Yesterday felt like back of legs and buttocks were on fire.  Slept poorly over the weekend.     Review of Systems     Objective:   Physical Exam  Constitutional: He is oriented to person, place, and time. He appears well-developed and well-nourished.  HENT:  Head: Normocephalic and atraumatic.  Cardiovascular: Normal rate, regular rhythm and normal heart sounds.   Pulmonary/Chest: Effort normal and breath sounds normal.  Abdominal: Soft. Bowel sounds are normal. He exhibits no distension and no mass. There is no tenderness. There is no rebound and no guarding.  Neurological: He is alert and oriented to person, place, and time.  Skin: Skin is warm and dry.  Psychiatric: He has a normal mood and affect. His behavior is normal.          Assessment & Plan:  Possible UTI-was noticing change in urinary sediment, abdominal bloating and loose stools he feels that the symptoms are very significant for possible UTI for him. We discussed the option of repeating the culture and awaiting the results versus going ahead and treating. He says he is just tired of not feeling well and would like to go ahead and treat. Will start Keflex 500 mg 3 times a day. He will need a 14 day  course and see if a complicated UTI with a chronic indwelling catheter. The original culture showed only ploy morphotypes. Wilson repeat culture and call with the results once they're available. Reminded him that this takes about 3-4 days to get back. Certainly call us if he feels that he's getting nauseated or runs a fever.

## 2012-12-04 ENCOUNTER — Telehealth: Payer: Self-pay

## 2012-12-04 NOTE — Telephone Encounter (Signed)
Will need to continue current regimen until culture is back. We will check again before we close today in case results come in this afternoon.

## 2012-12-04 NOTE — Telephone Encounter (Signed)
Patient advised.

## 2012-12-04 NOTE — Telephone Encounter (Signed)
Thomas Schroeder is calling in reference to his urine culture and persisting urinary symptoms. I called the lab about his urine culture that was collected on the 17 th. The culture is still in process. The keflex has helped with some of his urinary symptoms but has not resolved them. He is still having cloudy urine and odor. He was hoping the culture was back because he is sure he is not on the right antibiotic. Please advise.

## 2012-12-06 LAB — URINE CULTURE: Colony Count: 10000

## 2013-03-16 ENCOUNTER — Encounter: Payer: Self-pay | Admitting: Family Medicine

## 2013-03-16 ENCOUNTER — Ambulatory Visit (INDEPENDENT_AMBULATORY_CARE_PROVIDER_SITE_OTHER): Payer: 59 | Admitting: Family Medicine

## 2013-03-16 VITALS — BP 111/72 | HR 91 | Wt 170.0 lb

## 2013-03-16 DIAGNOSIS — L237 Allergic contact dermatitis due to plants, except food: Secondary | ICD-10-CM

## 2013-03-16 DIAGNOSIS — L255 Unspecified contact dermatitis due to plants, except food: Secondary | ICD-10-CM

## 2013-03-16 MED ORDER — PREDNISONE 20 MG PO TABS: ORAL_TABLET | ORAL | Status: AC

## 2013-03-16 NOTE — Progress Notes (Signed)
CC: Thomas Schroeder is a 52 y.o. male is here for rash on torso   Subjective: HPI:  Patient complains of a itchy red rash localized on the abdomen that has been worsening for the past week. It is proceeded by similar looking rash on the forearms 2 weeks ago that is improving with cortisone cream, rash on the abdomen not improving with cortisone cream. It is worse with scratching, improves with sleeping alone. Itching is moderate in severity. He reports exposure to poison ivy approximately 2 weeks ago has since started cleaning all of his clothes and wheelchair.  He denies wheezing, shortness of breath, flushing, rapid heartbeat, and mouth swelling, itchy eyes, cough.   Review Of Systems Outlined In HPI  Past Medical History  Diagnosis Date  . Allergy   . Chronic kidney disease   . Chronic indwelling foley catheter   . Night muscle spasms   . ED (erectile dysfunction)   . History of recurrent UTIs   . Paraplegia      Family History  Problem Relation Age of Onset  . Hyperlipidemia Mother   . Diabetes Mother   . Rosacea Mother   . Hypertension Neg Hx   . Lung cancer Mother     smoker     History  Substance Use Topics  . Smoking status: Never Smoker   . Smokeless tobacco: Not on file  . Alcohol Use: 6.0 oz/week    12 drink(s) per week     Comment: per week     Objective: Filed Vitals:   03/16/13 0823  BP: 111/72  Pulse: 91    General: Alert and Oriented, No Acute Distress HEENT: Pupils equal, round, reactive to light. Conjunctivae clear.  Moist mucous membranes pharynx unremarkable Lungs: Clear to auscultation bilaterally, no wheezing/ronchi/rales.  Comfortable work of breathing. Good air movement. Cardiac: Regular rate and rhythm. Normal S1/S2.  No murmurs, rubs, nor gallops.   Abdomen: Soft nontender to palpation Extremities: No peripheral edema.  Strong peripheral pulses.  Mental Status: No depression, anxiety, nor agitation. Skin: Warm and dry. Diffuse vesicular  rash with mildly erythematous base in clusters and streaks encompassing multiple dermatomes on the abdomen slight appearance of the proximal forearms  Assessment & Plan: Thomas Schroeder was seen today for rash on torso.  Diagnoses and associated orders for this visit:  Poison ivy - predniSONE (DELTASONE) 20 MG tablet; Three tabs daily days 1-3, two tabs daily days 4-6, one tab daily days 7-9, half tab daily days 10-13.    Discussed options including oral prednisone, topical steroids, antihistamines for it itch is only interested in oral prednisone taper. Encouraged to call before we close on Thursday if he changes his mind.  Return if symptoms worsen or fail to improve.

## 2013-07-21 ENCOUNTER — Other Ambulatory Visit: Payer: Self-pay | Admitting: Family Medicine

## 2013-07-22 ENCOUNTER — Other Ambulatory Visit: Payer: Self-pay

## 2013-10-06 ENCOUNTER — Other Ambulatory Visit: Payer: Self-pay | Admitting: Family Medicine

## 2013-10-18 DIAGNOSIS — Z872 Personal history of diseases of the skin and subcutaneous tissue: Secondary | ICD-10-CM | POA: Insufficient documentation

## 2014-02-17 ENCOUNTER — Ambulatory Visit: Payer: 59 | Admitting: Family Medicine

## 2014-02-24 ENCOUNTER — Telehealth: Payer: Self-pay | Admitting: *Deleted

## 2014-03-02 NOTE — Telephone Encounter (Signed)
Error

## 2014-06-19 ENCOUNTER — Other Ambulatory Visit: Payer: Self-pay | Admitting: Family Medicine

## 2014-07-04 DIAGNOSIS — IMO0002 Reserved for concepts with insufficient information to code with codable children: Secondary | ICD-10-CM | POA: Insufficient documentation

## 2014-07-27 ENCOUNTER — Other Ambulatory Visit: Payer: Self-pay | Admitting: Family Medicine

## 2014-08-04 ENCOUNTER — Ambulatory Visit (INDEPENDENT_AMBULATORY_CARE_PROVIDER_SITE_OTHER): Payer: 59 | Admitting: Family Medicine

## 2014-08-04 ENCOUNTER — Encounter: Payer: Self-pay | Admitting: Family Medicine

## 2014-08-04 VITALS — BP 116/78 | HR 98 | Temp 98.7°F | Wt 180.0 lb

## 2014-08-04 DIAGNOSIS — Z23 Encounter for immunization: Secondary | ICD-10-CM

## 2014-08-04 DIAGNOSIS — G8220 Paraplegia, unspecified: Secondary | ICD-10-CM

## 2014-08-04 DIAGNOSIS — N200 Calculus of kidney: Secondary | ICD-10-CM

## 2014-08-04 DIAGNOSIS — E785 Hyperlipidemia, unspecified: Secondary | ICD-10-CM

## 2014-08-04 DIAGNOSIS — N390 Urinary tract infection, site not specified: Secondary | ICD-10-CM

## 2014-08-04 MED ORDER — TRIMETHOPRIM 100 MG PO TABS: ORAL_TABLET | ORAL | Status: DC

## 2014-08-04 NOTE — Patient Instructions (Signed)
Try to fax [place] a copy of your cholesterol and bloodwork levels.

## 2014-08-04 NOTE — Addendum Note (Signed)
Addended by: Teddy Spike on: 08/04/2014 05:15 PM   Modules accepted: Orders

## 2014-08-04 NOTE — Progress Notes (Signed)
   Subjective:    Patient ID: Thomas Schroeder, male    DOB: 08/21/1961, 53 y.o.   MRN: [id number]  HPI pt stated that he was just wanting to update Dr. Madilyn Fireman about the past year. he will get his flu shot and labs done thru his employer. He also had labs done. You try to get [place] a copy of this cholesterol etc.  Recurrent UTI/kidney - needs refill on his trimethroprin today. Last here about a year ago  he did have a hospitalization only this year. He had a kidney stone that he had lithotripsy performed on. Afterwards he became septic and ended up in the hospital. He had been given a prophylactic dose of ciprofloxacin but it was not effective. Otherwise he's been doing well. He's moved into a new home and now lives in [place] with his girlfriend and his girlfriend's children.  Paraplegia-he does get some occasional skin breakdown on his bottom. He was using Protopic which was working well, but unfortunately insurance paying for. His dermatologist wrote multiple letters but was still not covered. He is now using an over-the-counter product called working hands. Been using it on his hands to help with cracking and dryness and decided to use it on his bottom area has been very helpful.  Review of Systems     Objective:   Physical Exam  Constitutional: He is oriented to person, place, and time. He appears well-developed and well-nourished.  HENT:  Head: Normocephalic and atraumatic.  Cardiovascular: Normal rate, regular rhythm and normal heart sounds.   Pulmonary/Chest: Effort normal and breath sounds normal.  Neurological: He is alert and oriented to person, place, and time.  Skin: Skin is warm and dry.  Psychiatric: He has a normal mood and affect. His behavior is normal.          Assessment & Plan:  recurrent UTI-will refill trimethoprim for one year.  Hyperlipidemia-he will get [place] a copy of his lipid panel from work.  Paraplegia-occasional skin breakdown seems to be managing this  very well. He also gets recurrent UTIs and kidney stones.  Discussed shingles vaccine  Given Pneumococcal 23 today since higher risk.  Given Tdap today.

## 2014-08-15 ENCOUNTER — Encounter: Payer: Self-pay | Admitting: Family Medicine

## 2014-08-15 MED ORDER — TRIMETHOPRIM 100 MG PO TABS: ORAL_TABLET | ORAL | Status: DC

## 2014-08-15 NOTE — Telephone Encounter (Signed)
Please call [facility] and see why they haven't filled his trimethriprim. It was sent on 08/04/14.

## 2015-02-28 ENCOUNTER — Other Ambulatory Visit: Payer: Self-pay | Admitting: Family Medicine

## 2015-06-16 ENCOUNTER — Ambulatory Visit (INDEPENDENT_AMBULATORY_CARE_PROVIDER_SITE_OTHER): Payer: 59 | Admitting: Family Medicine

## 2015-06-16 ENCOUNTER — Encounter: Payer: Self-pay | Admitting: Family Medicine

## 2015-06-16 VITALS — BP 130/84 | HR 89 | Temp 98.3°F | Wt 180.0 lb

## 2015-06-16 DIAGNOSIS — Z1159 Encounter for screening for other viral diseases: Secondary | ICD-10-CM | POA: Diagnosis not present

## 2015-06-16 DIAGNOSIS — Z23 Encounter for immunization: Secondary | ICD-10-CM

## 2015-06-16 DIAGNOSIS — E785 Hyperlipidemia, unspecified: Secondary | ICD-10-CM | POA: Insufficient documentation

## 2015-06-16 DIAGNOSIS — R5383 Other fatigue: Secondary | ICD-10-CM | POA: Diagnosis not present

## 2015-06-16 DIAGNOSIS — Z0000 Encounter for general adult medical examination without abnormal findings: Secondary | ICD-10-CM | POA: Diagnosis not present

## 2015-06-16 DIAGNOSIS — R6882 Decreased libido: Secondary | ICD-10-CM

## 2015-06-16 MED ORDER — TRIMETHOPRIM 100 MG PO TABS: ORAL_TABLET | ORAL | Status: DC

## 2015-06-16 NOTE — Patient Instructions (Signed)
Keep up a regular exercise program and make sure you are eating a healthy diet Try to eat 4 servings of dairy a day, or if you are lactose intolerant take a calcium with vitamin D daily.  Your vaccines are up to date.   

## 2015-06-16 NOTE — Progress Notes (Signed)
Subjective:    Patient ID: Thomas Schroeder, male    DOB: [birth year], 54 y.o.   MRN: [id number]  HPI Here for complete physical today. He has no specific concerns this is here for a physical today. He is otherwise doing well. He has no specific concerns. He has not been exercising regularly like he was. At one point he was doing 3 miles 3 days a week. He is a paraplegic and wheelchair bound. He does need a refill on his trimethoprim and is looking for new urologist.  Review of Systems Comprehensive review of systems is negative.  BP 130/84 mmHg  Pulse 89  Temp(Src) 98.3 F (36.8 C)  Wt 180 lb (81.647 kg)    Allergies  Allergen Reactions  . Bactrim Rash  . Latex Rash    Past Medical History  Diagnosis Date  . Allergy   . Chronic kidney disease   . Chronic indwelling Foley catheter   . Night muscle spasms   . ED (erectile dysfunction)   . History of recurrent UTIs   . Paraplegia     Past Surgical History  Procedure Laterality Date  . T8 t9 fusion     . Femur fracture surgery    . Appendectomy    . Kidney stone removal    . Rt tibia fracture    . Tonsillectomy      Social History   Social History  . Marital Status: Divorced    Spouse Name: N/A  . Number of Children: 2  . Years of Education: N/A   Occupational History  .     Social History Main Topics  . Smoking status: Never Smoker   . Smokeless tobacco: Not on file  . Alcohol Use: 6.0 oz/week    10 Standard drinks or equivalent per week     Comment: per week  . Drug Use: Not on file  . Sexual Activity:    Partners: Female   Other Topics Concern  . Not on file   Social History Narrative   1 caffeine drinks per day.  Some exercise.     Family History  Problem Relation Age of Onset  . Hyperlipidemia Mother   . Diabetes Mother   . Rosacea Mother   . Hypertension Neg Hx   . Lung cancer Mother     smoker    Outpatient Encounter Prescriptions as of 06/16/2015  Medication Sig  . Ascorbic Acid  (VITAMIN C) 100 MG tablet Take 100 mg by mouth daily.    Marland Kitchen aspirin 325 MG tablet Take 325 mg by mouth daily.    Marland Kitchen b complex vitamins tablet Take 1 tablet by mouth daily.    Marland Kitchen CRANBERRY EXTRACT PO Take by mouth.    . diazepam (VALIUM) 10 MG tablet Take 20 mg by mouth.  . Multiple Vitamin (MULTI VITAMIN MENS PO) Take by mouth.    Marland Kitchen tiZANidine (ZANAFLEX) 4 MG capsule   . trimethoprim (TRIMPEX) 100 MG tablet Take 1 tablet by mouth  every day  . vitamin E 100 UNIT capsule Take 100 Units by mouth daily.    . [DISCONTINUED] trimethoprim (TRIMPEX) 100 MG tablet Take 1 tablet by mouth  every day  . [DISCONTINUED] SSD 1 % cream   . [DISCONTINUED] trimethoprim (TRIMPEX) 100 MG tablet Take 1 tablet by mouth  every day   No facility-administered encounter medications on file as of 06/16/2015.          Objective:   Physical Exam  Constitutional:  He is oriented to person, place, and time. He appears well-developed and well-nourished.  HENT:  Head: Normocephalic and atraumatic.  Right Ear: External ear normal.  Left Ear: External ear normal.  Nose: Nose normal.  Mouth/Throat: Oropharynx is clear and moist.  TMs and canals are clear.   Eyes: Conjunctivae and EOM are normal. Pupils are equal, round, and reactive to light.  Neck: Neck supple. No thyromegaly present.  Cardiovascular: Normal rate and normal heart sounds.   Pulmonary/Chest: Effort normal and breath sounds normal.  Abdominal: Soft. Bowel sounds are normal. He exhibits no distension and no mass. There is no tenderness. There is no rebound and no guarding.  Musculoskeletal:  Trace ankle edema on the right with a little bit more on the left compared to the right ankle. He is having a lot of spasticity in his legs today.  Lymphadenopathy:    He has no cervical adenopathy.  Neurological: He is alert and oriented to person, place, and time.  Skin: Skin is warm and dry.  Psychiatric: He has a normal mood and affect.           Assessment & Plan:  CPE -  Keep up a regular exercise program and make sure you are eating a healthy diet Try to eat 4 servings of dairy a day, or if you are lactose intolerant take a calcium with vitamin D daily.  Your vaccines are up to date.   Flu vaccine given today.

## 2015-06-17 LAB — LIPID PANEL
Cholesterol: 184 mg/dL (ref 125–200)
HDL: 33 mg/dL — ABNORMAL LOW (ref >=40)
LDL Cholesterol: 121 mg/dL (ref <130)
Total CHOL/HDL Ratio: 5.6 Ratio — ABNORMAL HIGH (ref <=5.0)
Triglycerides: 150 mg/dL — ABNORMAL HIGH (ref <150)
VLDL: 30 mg/dL (ref <30)

## 2015-06-17 LAB — COMPLETE METABOLIC PANEL WITH GFR
ALT: 29 U/L (ref 9–46)
AST: 18 U/L (ref 10–35)
Albumin: 4.4 g/dL (ref 3.6–5.1)
Alkaline Phosphatase: 76 U/L (ref 40–115)
BUN: 15 mg/dL (ref 7–25)
CO2: 24 mmol/L (ref 20–31)
Calcium: 9.6 mg/dL (ref 8.6–10.3)
Chloride: 101 mmol/L (ref 98–110)
Creat: 0.75 mg/dL (ref 0.70–1.33)
GFR, Est African American: >89 mL/min (ref >=60)
GFR, Est Non African American: >89 mL/min (ref >=60)
Glucose, Bld: 83 mg/dL (ref 65–99)
Potassium: 4.1 mmol/L (ref 3.5–5.3)
Sodium: 137 mmol/L (ref 135–146)
Total Bilirubin: 0.7 mg/dL (ref 0.2–1.2)
Total Protein: 6.9 g/dL (ref 6.1–8.1)

## 2015-06-17 LAB — PSA: PSA: 0.68 ng/mL (ref <=4.00)

## 2015-06-17 LAB — HEPATITIS C ANTIBODY: HCV Ab: NEGATIVE

## 2015-06-19 LAB — TESTOSTERONE, FREE, TOTAL, SHBG
Sex Hormone Binding: 19 nmol/L (ref 10–50)
Testosterone, Free: 99.5 pg/mL (ref 47.0–244.0)
Testosterone-% Free: 2.7 % (ref 1.6–2.9)
Testosterone: 374 ng/dL (ref 300–890)

## 2015-08-13 ENCOUNTER — Encounter (HOSPITAL_COMMUNITY): Payer: Self-pay

## 2015-08-13 ENCOUNTER — Emergency Department (HOSPITAL_COMMUNITY)
Admission: EM | Admit: 2015-08-13 | Discharge: 2015-08-13 | Disposition: A | Discharge status: Home / Self Care | Payer: 59 | Attending: Emergency Medicine | Admitting: Emergency Medicine

## 2015-08-13 DIAGNOSIS — Z7982 Long term (current) use of aspirin: Secondary | ICD-10-CM | POA: Diagnosis not present

## 2015-08-13 DIAGNOSIS — Z87440 Personal history of urinary (tract) infections: Secondary | ICD-10-CM | POA: Diagnosis not present

## 2015-08-13 DIAGNOSIS — Z960 Presence of urogenital implants: Secondary | ICD-10-CM | POA: Insufficient documentation

## 2015-08-13 DIAGNOSIS — Y998 Other external cause status: Secondary | ICD-10-CM | POA: Diagnosis not present

## 2015-08-13 DIAGNOSIS — Z79899 Other long term (current) drug therapy: Secondary | ICD-10-CM | POA: Diagnosis not present

## 2015-08-13 DIAGNOSIS — Z792 Long term (current) use of antibiotics: Secondary | ICD-10-CM | POA: Insufficient documentation

## 2015-08-13 DIAGNOSIS — G8220 Paraplegia, unspecified: Secondary | ICD-10-CM | POA: Insufficient documentation

## 2015-08-13 DIAGNOSIS — W050XXA Fall from non-moving wheelchair, initial encounter: Secondary | ICD-10-CM | POA: Insufficient documentation

## 2015-08-13 DIAGNOSIS — Y9289 Other specified places as the place of occurrence of the external cause: Secondary | ICD-10-CM | POA: Insufficient documentation

## 2015-08-13 DIAGNOSIS — S3131XA Laceration without foreign body of scrotum and testes, initial encounter: Secondary | ICD-10-CM

## 2015-08-13 DIAGNOSIS — Y9389 Activity, other specified: Secondary | ICD-10-CM | POA: Insufficient documentation

## 2015-08-13 DIAGNOSIS — S3994XA Unspecified injury of external genitals, initial encounter: Secondary | ICD-10-CM | POA: Diagnosis present

## 2015-08-13 DIAGNOSIS — Z91040 Latex allergy status: Secondary | ICD-10-CM | POA: Diagnosis not present

## 2015-08-13 DIAGNOSIS — N189 Chronic kidney disease, unspecified: Secondary | ICD-10-CM | POA: Diagnosis not present

## 2015-08-13 LAB — I-STAT CHEM 8, ED
BUN: 23 mg/dL — ABNORMAL HIGH (ref 6–20)
Calcium, Ion: 1.14 mmol/L (ref 1.12–1.23)
Chloride: 102 mmol/L (ref 101–111)
Creatinine, Ser: 1.2 mg/dL (ref 0.61–1.24)
Glucose, Bld: 134 mg/dL — ABNORMAL HIGH (ref 65–99)
HCT: 47 % (ref 39.0–52.0)
Hemoglobin: 16 g/dL (ref 13.0–17.0)
Potassium: 5.2 mmol/L — ABNORMAL HIGH (ref 3.5–5.1)
Sodium: 136 mmol/L (ref 135–145)
TCO2: 23 mmol/L (ref 0–100)

## 2015-08-13 MED ORDER — LIDOCAINE-EPINEPHRINE 1 %-1:100000 IJ SOLN
2.0000 mL | Freq: Once | INTRAMUSCULAR | Status: AC
  Administered 2015-08-13: 2 mL via INTRADERMAL

## 2015-08-13 MED ORDER — CEPHALEXIN 500 MG PO CAPS: 500.0000 mg | ORAL_CAPSULE | Freq: Three times a day (TID) | ORAL | Status: DC

## 2015-08-13 MED ORDER — LIDOCAINE-EPINEPHRINE (PF) 2 %-1:200000 IJ SOLN: 10.0000 mL | Freq: Once | INTRAMUSCULAR | Status: DC

## 2015-08-13 MED ORDER — CEPHALEXIN 250 MG PO CAPS
500.0000 mg | ORAL_CAPSULE | Freq: Once | ORAL | Status: AC
  Administered 2015-08-13: 500 mg via ORAL
  Filled 2015-08-13: qty 2

## 2015-08-13 MED ORDER — LIDOCAINE HCL (PF) 1 % IJ SOLN
5.0000 mL | Freq: Once | INTRAMUSCULAR | Status: DC
  Filled 2015-08-13: qty 5

## 2015-08-13 NOTE — ED Notes (Signed)
MD at bedside. 

## 2015-08-13 NOTE — Discharge Instructions (Signed)
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
## 2015-08-21 ENCOUNTER — Ambulatory Visit (INDEPENDENT_AMBULATORY_CARE_PROVIDER_SITE_OTHER): Payer: 59 | Admitting: Family Medicine

## 2015-08-21 ENCOUNTER — Encounter: Payer: Self-pay | Admitting: Family Medicine

## 2015-08-21 VITALS — BP 150/88 | HR 89

## 2015-08-21 DIAGNOSIS — G8220 Paraplegia, unspecified: Secondary | ICD-10-CM | POA: Diagnosis not present

## 2015-08-21 DIAGNOSIS — Z4802 Encounter for removal of sutures: Secondary | ICD-10-CM | POA: Diagnosis not present

## 2015-08-21 NOTE — Assessment & Plan Note (Signed)
Sutures removed. Follow-up as needed.

## 2015-08-21 NOTE — Progress Notes (Signed)
Thomas Schroeder is a 54 y.o. male who presents to [facility]  today for suture removal. Patient was seen about a week ago for scrotum laceration in the emergency room. He is wheelchair-bound and fell forward landing on his scrotum. He received several sutures. He feels well with no drainage. No fevers or chills.   Past Medical History  Diagnosis Date  . Allergy   . Chronic kidney disease   . Chronic indwelling Foley catheter   . Night muscle spasms   . ED (erectile dysfunction)   . History of recurrent UTIs   . Paraplegia [facility])    Past Surgical History  Procedure Laterality Date  . T8 t9 fusion     . Femur fracture surgery    . Appendectomy    . Kidney stone removal    . Rt tibia fracture    . Tonsillectomy     Social History  Substance Use Topics  . Smoking status: Never Smoker   . Smokeless tobacco: Not on file  . Alcohol Use: 6.0 oz/week    10 Standard drinks or equivalent per week     Comment: per week   family history includes Diabetes in his mother; Hyperlipidemia in his mother; Lung cancer in his mother; Rosacea in his mother. There is no history of Hypertension.  ROS as above Medications: Current Outpatient Prescriptions  Medication Sig Dispense Refill  . Ascorbic Acid (VITAMIN C) 100 MG tablet Take 100 mg by mouth daily.      Marland Kitchen aspirin 325 MG tablet Take 325 mg by mouth daily.      Marland Kitchen b complex vitamins tablet Take 1 tablet by mouth daily.      . cholecalciferol (VITAMIN D) 1000 UNITS tablet Take 1,000 Units by mouth daily.    Marland Kitchen CRANBERRY EXTRACT PO Take 1 tablet by mouth daily.     . diazepam (VALIUM) 10 MG tablet Take 10 mg by mouth See admin instructions. Take 1 tablet every morning. Can take another tablet as needed for anxiety.    . Garlic 123XX123 MG CAPS Take 1 capsule by mouth daily.    . Multiple Vitamin (MULTI VITAMIN MENS PO) Take 1 tablet by mouth daily.     . Omega-3 Fatty Acids (FISH OIL) 1000 MG CAPS Take 1 capsule by  mouth daily.    . Probiotic Product ([facility] PO) Take 1 tablet by mouth daily.    Marland Kitchen tiZANidine (ZANAFLEX) 4 MG capsule Take 4 mg by mouth at bedtime.     Marland Kitchen trimethoprim (TRIMPEX) 100 MG tablet Take 1 tablet by mouth  every day 90 tablet 3  . vitamin E 400 UNIT capsule Take 400 Units by mouth daily.     No current facility-administered medications for this visit.   Allergies  Allergen Reactions  . Bactrim Rash  . Latex Rash     Exam:  BP 150/88 mmHg  Pulse 89 Gen: Well NAD Scrotum is well appearing without any erythema induration or tenderness. Sutures are removed. No significant dehiscence. Steri-Strips applied.  No results found for this or any previous visit (from the past 24 hour(s)). No results found.   Please see individual assessment and plan sections.

## 2015-08-21 NOTE — Patient Instructions (Signed)
Thank you for coming in today. Return as needed.  Remove the steri strips when they start to curl.

## 2016-01-24 ENCOUNTER — Telehealth: Payer: Self-pay | Admitting: *Deleted

## 2016-01-24 NOTE — Telephone Encounter (Signed)
Pt called and stated that he needs a handicap placard and an Rx for vehicle hand controls. He will email the handicap placard and will come in and p/u both of these when ready.[name]

## 2016-01-25 ENCOUNTER — Telehealth: Payer: Self-pay | Admitting: *Deleted

## 2016-01-25 MED ORDER — AMBULATORY NON FORMULARY MEDICATION: Status: DC

## 2016-01-25 NOTE — Telephone Encounter (Signed)
Pt will need a rx for handicap control for car.Thomas Schroeder [place]

## 2016-01-31 ENCOUNTER — Emergency Department (HOSPITAL_COMMUNITY)
Admission: EM | Admit: 2016-01-31 | Discharge: 2016-01-31 | Disposition: A | Discharge status: Home / Self Care | Payer: 59 | Attending: Emergency Medicine | Admitting: Emergency Medicine

## 2016-01-31 ENCOUNTER — Encounter (HOSPITAL_COMMUNITY): Payer: Self-pay | Admitting: Emergency Medicine

## 2016-01-31 DIAGNOSIS — Z79899 Other long term (current) drug therapy: Secondary | ICD-10-CM | POA: Diagnosis not present

## 2016-01-31 DIAGNOSIS — Y999 Unspecified external cause status: Secondary | ICD-10-CM | POA: Insufficient documentation

## 2016-01-31 DIAGNOSIS — Y939 Activity, unspecified: Secondary | ICD-10-CM | POA: Diagnosis not present

## 2016-01-31 DIAGNOSIS — N189 Chronic kidney disease, unspecified: Secondary | ICD-10-CM | POA: Diagnosis not present

## 2016-01-31 DIAGNOSIS — Z91040 Latex allergy status: Secondary | ICD-10-CM | POA: Diagnosis not present

## 2016-01-31 DIAGNOSIS — Z7982 Long term (current) use of aspirin: Secondary | ICD-10-CM | POA: Diagnosis not present

## 2016-01-31 DIAGNOSIS — Y929 Unspecified place or not applicable: Secondary | ICD-10-CM | POA: Diagnosis not present

## 2016-01-31 DIAGNOSIS — S3131XA Laceration without foreign body of scrotum and testes, initial encounter: Secondary | ICD-10-CM | POA: Insufficient documentation

## 2016-01-31 MED ORDER — DOXYCYCLINE HYCLATE 100 MG PO CAPS: 100.0000 mg | ORAL_CAPSULE | Freq: Two times a day (BID) | ORAL | Status: DC

## 2016-01-31 NOTE — Discharge Instructions (Signed)
[name],  Nice meeting you! Please follow-up with your primary care provider in one week. Return to the emergency department if you develop fevers, chills, bleeding, redness, new/worsening symptoms. Feel better soon!  S. Wendie Simmer, PA-C Laceration Care, Adult A laceration is a cut that goes through all of the layers of the skin and into the tissue that is right under the skin. Some lacerations heal on their own. Others need to be closed with stitches (sutures), staples, skin adhesive strips, or skin glue. Proper laceration care minimizes the risk of infection and helps the laceration to heal better. HOW TO CARE FOR YOUR LACERATION If sutures or staples were used:  Keep the wound clean and dry.  If you were given a bandage (dressing), you should change it at least one time per day or as told by your health care provider. You should also change it if it becomes wet or dirty.  Keep the wound completely dry for the first 24 hours or as told by your health care provider. After that time, you may shower or bathe. However, make sure that the wound is not soaked in water until after the sutures or staples have been removed.  Clean the wound one time each day or as told by your health care provider:  Wash the wound with soap and water.  Rinse the wound with water to remove all soap.  Pat the wound dry with a clean towel. Do not rub the wound.  After cleaning the wound, apply a thin layer of antibiotic ointmentas told by your health care provider. This will help to prevent infection and keep the dressing from sticking to the wound.  Have the sutures or staples removed as told by your health care provider. If skin adhesive strips were used:  Keep the wound clean and dry.  If you were given a bandage (dressing), you should change it at least one time per day or as told by your health care provider. You should also change it if it becomes dirty or wet.  Do not get the skin adhesive  strips wet. You may shower or bathe, but be careful to keep the wound dry.  If the wound gets wet, pat it dry with a clean towel. Do not rub the wound.  Skin adhesive strips fall off on their own. You may trim the strips as the wound heals. Do not remove skin adhesive strips that are still stuck to the wound. They will fall off in time. If skin glue was used:  Try to keep the wound dry, but you may briefly wet it in the shower or bath. Do not soak the wound in water, such as by swimming.  After you have showered or bathed, gently pat the wound dry with a clean towel. Do not rub the wound.  Do not do any activities that will make you sweat heavily until the skin glue has fallen off on its own.  Do not apply liquid, cream, or ointment medicine to the wound while the skin glue is in place. Using those may loosen the film before the wound has healed.  If you were given a bandage (dressing), you should change it at least one time per day or as told by your health care provider. You should also change it if it becomes dirty or wet.  If a dressing is placed over the wound, be careful not to apply tape directly over the skin glue. Doing that may cause the glue  to be pulled off before the wound has healed.  Do not pick at the glue. The skin glue usually remains in place for 5-10 days, then it falls off of the skin. General Instructions  Take over-the-counter and prescription medicines only as told by your health care provider.  If you were prescribed an antibiotic medicine or ointment, take or apply it as told by your doctor. Do not stop using it even if your condition improves.  To help prevent scarring, make sure to cover your wound with sunscreen whenever you are outside after stitches are removed, after adhesive strips are removed, or when glue remains in place and the wound is healed. Make sure to wear a sunscreen of at least 30 SPF.  Do not scratch or pick at the wound.  Keep all follow-up  visits as told by your health care provider. This is important.  Check your wound every day for signs of infection. Watch for:  Redness, swelling, or pain.  Fluid, blood, or pus.  Raise (elevate) the injured area above the level of your heart while you are sitting or lying down, if possible. SEEK MEDICAL CARE IF:  You received a tetanus shot and you have swelling, severe pain, redness, or bleeding at the injection site.  You have a fever.  A wound that was closed breaks open.  You notice a bad smell coming from your wound or your dressing.  You notice something coming out of the wound, such as wood or glass.  Your pain is not controlled with medicine.  You have increased redness, swelling, or pain at the site of your wound.  You have fluid, blood, or pus coming from your wound.  You notice a change in the color of your skin near your wound.  You need to change the dressing frequently due to fluid, blood, or pus draining from the wound.  You develop a new rash.  You develop numbness around the wound. SEEK IMMEDIATE MEDICAL CARE IF:  You develop severe swelling around the wound.  Your pain suddenly increases and is severe.  You develop painful lumps near the wound or on skin that is anywhere on your body.  You have a red streak going away from your wound.  The wound is on your hand or foot and you cannot properly move a finger or toe.  The wound is on your hand or foot and you notice that your fingers or toes look pale or bluish.   This information is not intended to replace advice given to you by your health care provider. Make sure you discuss any questions you have with your health care provider.   Document Released: 09/02/2005 Document Revised: 01/17/2015 Document Reviewed: 08/29/2014 Elsevier Interactive Patient Education [employer].

## 2016-01-31 NOTE — ED Notes (Signed)
Pt reports that he tore a whole in his scrotum when transferring from his wheel chair. Pr alert x4. NAD at this time.

## 2016-01-31 NOTE — ED Notes (Signed)
See EDP assessment 

## 2016-02-08 ENCOUNTER — Ambulatory Visit: Payer: 59 | Admitting: Family Medicine

## 2016-02-09 NOTE — ED Provider Notes (Signed)
Medical screening examination/treatment/procedure(s) were conducted as a shared visit with non-physician practitioner(s) and myself.  I personally evaluated the patient during the encounter.   EKG Interpretation None     54yM with scrotal laceration. Happened when transferring. Appears consistent with tearing/shear type injury. Superficial. No involvement of deep structures. Mild bleeding. Amenable to bedside closure. See midlevel note of procedure note.   Virgel Manifold, MD 02/09/16 [phone]

## 2016-02-13 ENCOUNTER — Encounter: Payer: Self-pay | Admitting: Physician Assistant

## 2016-02-13 ENCOUNTER — Ambulatory Visit (INDEPENDENT_AMBULATORY_CARE_PROVIDER_SITE_OTHER): Payer: 59 | Admitting: Physician Assistant

## 2016-02-13 VITALS — BP 133/81 | HR 80

## 2016-02-13 DIAGNOSIS — S3131XD Laceration without foreign body of scrotum and testes, subsequent encounter: Secondary | ICD-10-CM | POA: Diagnosis not present

## 2016-02-13 DIAGNOSIS — Z4802 Encounter for removal of sutures: Secondary | ICD-10-CM

## 2016-02-13 NOTE — Progress Notes (Signed)
   Subjective:    Patient ID: Thomas Schroeder, male    DOB: August 13, 1961, 55 y.o.   MRN: [id number]  HPI  Pt is a 55 yo male parapelgic who comes in today to have sutures removed after scrotal laceration on 01/31/16 and went to ED. 5 stiches were placed. No problems or concerned.     Review of Systems  All other systems reviewed and are negative.      Objective:   Physical Exam  Constitutional: He is oriented to person, place, and time.  Genitourinary:  5 sutures with well healed laceration. No blood, pus, tenderness, warmth.   Neurological: He is alert and oriented to person, place, and time.  Psychiatric: He has a normal mood and affect. His behavior is normal.          Assessment & Plan:  Visit for suture laceration/scrotal laceration- 5 stiches removed without complication. Follow up as needed.

## 2016-03-03 NOTE — ED Provider Notes (Signed)
CSN: [id number]     Arrival date & time 01/31/16  1847 History   First MD Initiated Contact with Patient 01/31/16 1915     Chief Complaint  Patient presents with  . Wound Check   HPI   Thomas Schroeder is a 55 y.o. male PMH significant for paraplegia presenting with a scrotal laceration. He states his scrotum ripped when transferring from the toilet to his wheel chair. He denies fevers, chills, nausea/vomiting, dizziness, weakness, SOB.   Past Medical History  Diagnosis Date  . Allergy   . Chronic kidney disease   . Chronic indwelling Foley catheter   . Night muscle spasms   . ED (erectile dysfunction)   . History of recurrent UTIs   . Paraplegia [facility])    Past Surgical History  Procedure Laterality Date  . T8 t9 fusion     . Femur fracture surgery    . Appendectomy    . Kidney stone removal    . Rt tibia fracture    . Tonsillectomy     Family History  Problem Relation Age of Onset  . Hyperlipidemia Mother   . Diabetes Mother   . Rosacea Mother   . Hypertension Neg Hx   . Lung cancer Mother     smoker   Social History  Substance Use Topics  . Smoking status: Never Smoker   . Smokeless tobacco: None  . Alcohol Use: 6.0 oz/week    10 Standard drinks or equivalent per week     Comment: per week    Review of Systems  Ten systems are reviewed and are negative for acute change except as noted in the HPI  Allergies  Bactrim and Latex  Home Medications   Prior to Admission medications   Medication Sig Start Date End Date Taking? Authorizing Provider  AMBULATORY NON FORMULARY MEDICATION Medication Name: hand control for vehicle operation. G82.2 01/25/16   Silverio Decamp, MD  Ascorbic Acid (VITAMIN C) 100 MG tablet Take 100 mg by mouth daily.      Historical Provider, MD  aspirin 325 MG tablet Take 325 mg by mouth daily.      Historical Provider, MD  b complex vitamins tablet Take 1 tablet by mouth daily.      Historical Provider, MD  cholecalciferol (VITAMIN D)  1000 UNITS tablet Take 1,000 Units by mouth daily.    Historical Provider, MD  CRANBERRY EXTRACT PO Take 1 tablet by mouth daily.     Historical Provider, MD  diazepam (VALIUM) 10 MG tablet Take 10 mg by mouth See admin instructions. Take 1 tablet every morning. Can take another tablet as needed for anxiety. 10/18/13   Historical Provider, MD  Garlic 123XX123 MG CAPS Take 1 capsule by mouth daily.    Historical Provider, MD  Multiple Vitamin (MULTI VITAMIN MENS PO) Take 1 tablet by mouth daily.     Historical Provider, MD  Omega-3 Fatty Acids (FISH OIL) 1000 MG CAPS Take 1 capsule by mouth daily.    Historical Provider, MD  Probiotic Product ([place]) Take 1 tablet by mouth daily.    Historical Provider, MD  trimethoprim (TRIMPEX) 100 MG tablet Take 1 tablet by mouth  every day 06/16/15   Hali Marry, MD  vitamin E 400 UNIT capsule Take 400 Units by mouth daily.    Historical Provider, MD   BP 155/99 mmHg  Pulse 85  Temp(Src) 97.8 F (36.6 C) (Oral)  Resp 18  SpO2 99% Physical  Exam  Constitutional: He appears well-developed and well-nourished. No distress.  HENT:  Head: Normocephalic and atraumatic.  Mouth/Throat: Oropharynx is clear and moist. No oropharyngeal exudate.  Eyes: Conjunctivae are normal. Pupils are equal, round, and reactive to light. Right eye exhibits no discharge. Left eye exhibits no discharge. No scleral icterus.  Neck: No tracheal deviation present.  Cardiovascular: Normal rate, regular rhythm, normal heart sounds and intact distal pulses.  Exam reveals no gallop and no friction rub.   No murmur heard. Pulmonary/Chest: Effort normal and breath sounds normal. No respiratory distress. He has no wheezes. He has no rales. He exhibits no tenderness.  Abdominal: Soft. Bowel sounds are normal. He exhibits no distension and no mass. There is no tenderness. There is no rebound and no guarding.  Genitourinary: Penis normal.  Musculoskeletal: He exhibits no  edema.  Lymphadenopathy:    He has no cervical adenopathy.  Neurological: He is alert. Coordination normal.  Skin: Skin is warm and dry. No rash noted. He is not diaphoretic. No erythema.  Scrotal laceration approximately 3.5 cm consistent with shear injury. Oozing blood. No pulsatile bleeding.   Psychiatric: He has a normal mood and affect. His behavior is normal.  Nursing note and vitals reviewed.   ED Course  .Marland KitchenLaceration Repair Date/Time: 01/31/2016 8:00 PM Performed by: Alisia Ferrari NICOLE Authorized by: Belk Lions Consent: Verbal consent obtained. Risks and benefits: risks, benefits and alternatives were discussed Consent given by: patient Patient identity confirmed: verbally with patient and arm band Body area: anogenital Location details: scrotal wall Laceration length: 3.5 cm Foreign bodies: no foreign bodies Tendon involvement: none Nerve involvement: none Vascular damage: no Anesthesia method: none; paraplegia. Preparation: Patient was prepped and draped in the usual sterile fashion. Irrigation solution: saline Irrigation method: syringe (60 cc syringe with combiguard) Amount of cleaning: extensive Skin closure: 4-0 Prolene Number of sutures: 4 Patient tolerance: Patient tolerated the procedure well with no immediate complications   MDM   Final diagnoses:  Scrotal laceration, initial encounter   Pressure irrigation performed. Wound explored and base of wound visualized in a bloodless field without evidence of foreign body.  Laceration occurred < 8 hours prior to repair which was well tolerated.Tdap up to date. Pt has no comorbidities to effect normal wound healing. Pt discharged with doxycycline. Discussed suture home care with patient and answered questions. Pt to follow-up for wound check and suture removal in 7 days; they are to return to the ED sooner for signs of infection. Pt is hemodynamically stable with no complaints prior to dc.   Dr.  Wilson Singer evaluated pt as well and agrees with above plan.   [place] Lions, PA-C 03/03/16 [id number]  Virgel Manifold, MD 03/07/16 [phone]

## 2016-05-23 ENCOUNTER — Encounter: Payer: Self-pay | Admitting: Family Medicine

## 2016-05-23 ENCOUNTER — Ambulatory Visit (INDEPENDENT_AMBULATORY_CARE_PROVIDER_SITE_OTHER): Payer: 59 | Admitting: Family Medicine

## 2016-05-23 VITALS — BP 136/86 | HR 89

## 2016-05-23 DIAGNOSIS — L8992 Pressure ulcer of unspecified site, stage 2: Secondary | ICD-10-CM

## 2016-05-23 DIAGNOSIS — E785 Hyperlipidemia, unspecified: Secondary | ICD-10-CM

## 2016-05-23 DIAGNOSIS — Z23 Encounter for immunization: Secondary | ICD-10-CM

## 2016-05-23 DIAGNOSIS — Z0000 Encounter for general adult medical examination without abnormal findings: Secondary | ICD-10-CM

## 2016-05-23 NOTE — Progress Notes (Signed)
Subjective:    CC: Pressure ulcer  HPI: 55 year old paraplegic who comes in today complaining that approximately 1-2 weeks ago he was trying to transition between his car and wheelchair and actually slipped. Out a week ago he actually noticed a small bump near his rectal area on the left side. He said he tried to keep an eye on as best he could. He says it then looked more like a red dot. He then noticed that it felt like it was getting moist and like some of the skin was debrided. He's been mostly using Protopic on it and then covering it with a Band-Aid. He is also been sitting for long hours at work and has not been able to move to get pressure and relief office bottom. Normally he works from home but lately they have been having an office meetings and he's had to sit for hours at a time.  Past medical history, Surgical history, Family history not pertinant except as noted below, Social history, Allergies, and medications have been entered into the medical record, reviewed, and corrections made.   Review of Systems: No fevers, chills, night sweats, weight loss, chest pain, or shortness of breath.   Objective:    General: Well Developed, well nourished, and in no acute distress.  Neuro: Alert and oriented x3, extra-ocular muscles intact, sensation grossly intact.  HEENT: Normocephalic, atraumatic  Skin: Warm and dry, no rashes. Right buttock check with a 10 x 6 mm stage 2 ulceration in an oval shape.   Cardiac: Regular rate and rhythm, no murmurs rubs or gallops, no lower extremity edema.  Respiratory: Clear to auscultation bilaterally. Not using accessory muscles, speaking in full sentences.   Impression and Recommendations:    Pressure ulcer - discussed treatment. For now will use the DuoDERM. Cut to fit the area. Try to keep on for 3 days but can change more frequently if the area becomes dirty since it is near the rectum. Can cover with a Band-Aid or waterproof Band-Aid when showering.  Did encourage him to have his wife take pictures and tracked the size of the lesion. Ran out approximately 10 x 6 mm in size and is sort of oval-shaped. He can send pictures to me through my chart if needed to follow the healing of the lesion. Also given a work note so that he does not have to sit for long periods and continued to have pressure on the area

## 2016-05-30 ENCOUNTER — Encounter: Payer: Self-pay | Admitting: Family Medicine

## 2016-06-09 ENCOUNTER — Encounter: Payer: Self-pay | Admitting: Family Medicine

## 2016-06-17 ENCOUNTER — Ambulatory Visit (INDEPENDENT_AMBULATORY_CARE_PROVIDER_SITE_OTHER): Payer: 59 | Admitting: Family Medicine

## 2016-06-17 ENCOUNTER — Encounter: Payer: Self-pay | Admitting: Family Medicine

## 2016-06-17 VITALS — BP 121/65 | HR 80

## 2016-06-17 DIAGNOSIS — Z0000 Encounter for general adult medical examination without abnormal findings: Secondary | ICD-10-CM

## 2016-06-17 NOTE — Progress Notes (Signed)
Subjective:    Patient ID: Thomas Schroeder, male    DOB: [birth year], 55 y.o.   MRN: [id number]  HPI Here for CPE today.   He also has form for Handicap placard to complete today.    Review of Systems Comprehensive ROS is negative.   There were no vitals taken for this visit.    Allergies  Allergen Reactions  . Bactrim Rash  . Latex Rash    Past Medical History:  Diagnosis Date  . Allergy   . Chronic indwelling Foley catheter   . Chronic kidney disease   . ED (erectile dysfunction)   . History of recurrent UTIs   . Night muscle spasms   . Paraplegia [facility])     Past Surgical History:  Procedure Laterality Date  . APPENDECTOMY    . FEMUR FRACTURE SURGERY    . kidney stone removal    . RT tibia fracture    . T8 T9 fusion     . TONSILLECTOMY      Social History   Social History  . Marital status: Divorced    Spouse name: N/A  . Number of children: 2  . Years of education: N/A   Occupational History  .  [employer]   Social History Main Topics  . Smoking status: Never Smoker  . Smokeless tobacco: Not on file  . Alcohol use 6.0 oz/week    10 Standard drinks or equivalent per week     Comment: per week  . Drug use: Unknown  . Sexual activity: Yes    Partners: Female   Other Topics Concern  . Not on file   Social History Narrative   1 caffeine drinks per day.  Some exercise.     Family History  Problem Relation Age of Onset  . Hyperlipidemia Mother   . Diabetes Mother   . Rosacea Mother   . Hypertension Neg Hx   . Lung cancer Mother     smoker    Outpatient Encounter Prescriptions as of 06/17/2016  Medication Sig  . AMBULATORY NON FORMULARY MEDICATION Medication Name: hand control for vehicle operation. G82.2  . Ascorbic Acid (VITAMIN C) 100 MG tablet Take 100 mg by mouth daily.    Marland Kitchen aspirin 325 MG tablet Take 325 mg by mouth daily.    Marland Kitchen b complex vitamins tablet Take 1 tablet by mouth daily.    . cholecalciferol (VITAMIN D) 1000 UNITS tablet  Take 1,000 Units by mouth daily.  Marland Kitchen CRANBERRY EXTRACT PO Take 1 tablet by mouth daily.   . Garlic [id number] MG CAPS Take 1 capsule by mouth daily.  . Multiple Vitamin (MULTI VITAMIN MENS PO) Take 1 tablet by mouth daily.   . Omega-3 Fatty Acids (FISH OIL) 1000 MG CAPS Take 1 capsule by mouth daily.  . Probiotic Product (PHILLIPS [employer]ON HEALTH PO) Take 1 tablet by mouth daily.  Marland Kitchen trimethoprim (TRIMPEX) 100 MG tablet Take 1 tablet by mouth  every day  . vitamin E 400 UNIT capsule Take 400 Units by mouth daily.  . [DISCONTINUED] diazepam (VALIUM) 10 MG tablet Take 10 mg by mouth See admin instructions. Take 1 tablet every morning. Can take another tablet as needed for anxiety.   No facility-administered encounter medications on file as of 06/17/2016.          Objective:   Physical Exam  Constitutional: He is oriented to person, place, and time. He appears well-developed and well-nourished.  sitting in wheelchair  HENT:  Head:  Normocephalic and atraumatic.  Right Ear: External ear normal.  Left Ear: External ear normal.  Nose: Nose normal.  Mouth/Throat: Oropharynx is clear and moist.  Eyes: Conjunctivae and EOM are normal. Pupils are equal, round, and reactive to light.  Neck: Normal range of motion. Neck supple. No thyromegaly present.  Cardiovascular: Normal rate, regular rhythm, normal heart sounds and intact distal pulses.   Pulmonary/Chest: Effort normal and breath sounds normal.  Abdominal: Soft. Bowel sounds are normal. He exhibits no distension and no mass. There is no tenderness. There is no rebound and no guarding.  Musculoskeletal: Normal range of motion.  Lymphadenopathy:    He has no cervical adenopathy.  Neurological: He is alert and oriented to person, place, and time.  Skin: Skin is warm and dry.  Psychiatric: He has a normal mood and affect. His behavior is normal. Judgment and thought content normal.       Assessment & Plan:  CPE Keep up a regular exercise program  and make sure you are eating a healthy diet Try to eat 4 servings of dairy a day, or if you are lactose intolerant take a calcium with vitamin D daily.  Your vaccines are up to date.

## 2016-06-18 LAB — PSA: PSA: 0.6 ng/mL (ref <=4.0)

## 2016-06-18 LAB — COMPLETE METABOLIC PANEL WITH GFR
ALT: 23 U/L (ref 9–46)
AST: 17 U/L (ref 10–35)
Albumin: 4.3 g/dL (ref 3.6–5.1)
Alkaline Phosphatase: 55 U/L (ref 40–115)
BUN: 18 mg/dL (ref 7–25)
CO2: 28 mmol/L (ref 20–31)
Calcium: 9.4 mg/dL (ref 8.6–10.3)
Chloride: 105 mmol/L (ref 98–110)
Creat: 0.82 mg/dL (ref 0.70–1.33)
GFR, Est African American: >89 mL/min (ref >=60)
GFR, Est Non African American: >89 mL/min (ref >=60)
Glucose, Bld: 106 mg/dL — ABNORMAL HIGH (ref 65–99)
Potassium: 4.7 mmol/L (ref 3.5–5.3)
Sodium: 141 mmol/L (ref 135–146)
Total Bilirubin: 0.6 mg/dL (ref 0.2–1.2)
Total Protein: 6.4 g/dL (ref 6.1–8.1)

## 2016-06-18 LAB — LIPID PANEL
Cholesterol: 187 mg/dL (ref 125–200)
HDL: 45 mg/dL (ref >=40)
LDL Cholesterol: 115 mg/dL (ref <130)
Total CHOL/HDL Ratio: 4.2 Ratio (ref <=5.0)
Triglycerides: 134 mg/dL (ref <150)
VLDL: 27 mg/dL (ref <30)

## 2016-06-18 NOTE — Progress Notes (Signed)
All labs are normal. 

## 2016-08-01 ENCOUNTER — Other Ambulatory Visit: Payer: Self-pay

## 2016-08-01 ENCOUNTER — Encounter: Payer: Self-pay | Admitting: Family Medicine

## 2016-08-01 MED ORDER — TRIMETHOPRIM 100 MG PO TABS: ORAL_TABLET | ORAL | 3 refills | Status: DC

## 2016-08-17 ENCOUNTER — Encounter: Payer: Self-pay | Admitting: Family Medicine

## 2016-08-19 ENCOUNTER — Encounter: Payer: Self-pay | Admitting: Family Medicine

## 2016-08-19 ENCOUNTER — Other Ambulatory Visit: Payer: Self-pay

## 2016-08-19 MED ORDER — TRIMETHOPRIM 100 MG PO TABS: ORAL_TABLET | ORAL | 3 refills | Status: DC

## 2016-11-09 ENCOUNTER — Encounter: Payer: Self-pay | Admitting: Family Medicine

## 2016-11-09 DIAGNOSIS — H66003 Acute suppurative otitis media without spontaneous rupture of ear drum, bilateral: Secondary | ICD-10-CM

## 2016-11-13 NOTE — Addendum Note (Signed)
Addended by: Narda Rutherford on: 11/13/2016 11:04 AM   Modules accepted: Orders

## 2017-05-27 ENCOUNTER — Ambulatory Visit (INDEPENDENT_AMBULATORY_CARE_PROVIDER_SITE_OTHER): Payer: 59 | Admitting: Family Medicine

## 2017-05-27 ENCOUNTER — Encounter: Payer: Self-pay | Admitting: Family Medicine

## 2017-05-27 VITALS — BP 111/74 | HR 96 | Temp 98.0°F

## 2017-05-27 DIAGNOSIS — N12 Tubulo-interstitial nephritis, not specified as acute or chronic: Secondary | ICD-10-CM | POA: Diagnosis not present

## 2017-05-27 DIAGNOSIS — R8290 Unspecified abnormal findings in urine: Secondary | ICD-10-CM

## 2017-05-27 LAB — POCT URINALYSIS DIPSTICK
Bilirubin, UA: NEGATIVE
Glucose, UA: NEGATIVE
Ketones, UA: NEGATIVE
Nitrite, UA: POSITIVE
Protein, UA: NEGATIVE
Spec Grav, UA: <=1.005 — AB (ref 1.010–1.025)
Urobilinogen, UA: 0.2 E.U./dL
pH, UA: 6 (ref 5.0–8.0)

## 2017-05-27 MED ORDER — CEFIXIME 400 MG PO CAPS: 400.0000 mg | ORAL_CAPSULE | Freq: Every day | ORAL | 0 refills | Status: DC

## 2017-05-27 MED ORDER — CEFTRIAXONE SODIUM 250 MG IJ SOLR
1.0000 g | Freq: Once | INTRAMUSCULAR | Status: AC
  Administered 2017-05-27: 1 g via INTRAMUSCULAR

## 2017-05-27 NOTE — Progress Notes (Signed)
Subjective:    Patient ID: Thomas Schroeder, male    DOB: 02/29/1961, 56 y.o.   MRN: [id number]  HPI 56 year old male comes today with urinary symptoms. He's noticed that he's had a metallic odor to his urine for about a week and a half. Over the last couple of days he's been running a low-grade fever. Between 99 and 100.8. He's been mostly alternating Tylenol and ibuprofen. The highest temperature 103.6 last night. Has been having chills and shakes. Marland Kitchen He tried to get in with his urologist but was booked. Last took ibuprofen at 8 AM this morning. No fever this AM.  Some mild nausea but no vomiting.  No recent UTIs.    Does have appt with new Urologist, Dr. Nicki Reaper McDiarmid at [facility]. He specialized in patient who have cord injuries.     Review of Systems  No recent change in BMs. No vomiting.    BP 111/74   Pulse 96   Temp 98 F (36.7 C)   SpO2 100%     Allergies  Allergen Reactions  . Bactrim Rash    Skin sloughing   . Latex Rash    Past Medical History:  Diagnosis Date  . Allergy   . Chronic indwelling Foley catheter   . Chronic kidney disease   . ED (erectile dysfunction)   . History of recurrent UTIs   . Night muscle spasms   . Paraplegia [facility])     Past Surgical History:  Procedure Laterality Date  . APPENDECTOMY    . FEMUR FRACTURE SURGERY    . kidney stone removal    . RT tibia fracture    . T8 T9 fusion     . TONSILLECTOMY      Social History   Social History  . Marital status: Divorced    Spouse name: N/A  . Number of children: 2  . Years of education: N/A   Occupational History  .  [employer]   Social History Main Topics  . Smoking status: Never Smoker  . Smokeless tobacco: Never Used  . Alcohol use 6.0 oz/week    10 Standard drinks or equivalent per week     Comment: per week  . Drug use: Unknown  . Sexual activity: Yes    Partners: Female   Other Topics Concern  . Not on file   Social History Narrative   1 caffeine drinks per  day.  Some exercise.     Family History  Problem Relation Age of Onset  . Hyperlipidemia Mother   . Diabetes Mother   . Rosacea Mother   . Hypertension Neg Hx   . Lung cancer Mother        smoker    Outpatient Encounter Prescriptions as of 05/27/2017  Medication Sig  . AMBULATORY NON FORMULARY MEDICATION Medication Name: hand control for vehicle operation. G82.2  . Ascorbic Acid (VITAMIN C) 100 MG tablet Take 100 mg by mouth daily.    Marland Kitchen aspirin 325 MG tablet Take 325 mg by mouth daily.    Marland Kitchen b complex vitamins tablet Take 1 tablet by mouth daily.    . Cefixime (SUPRAX) 400 MG CAPS capsule Take 1 capsule (400 mg total) by mouth daily. X 14 days  . cholecalciferol (VITAMIN D) 1000 UNITS tablet Take 1,000 Units by mouth daily.  Marland Kitchen CRANBERRY EXTRACT PO Take 1 tablet by mouth daily.   . Garlic 2409 MG CAPS Take 1 capsule by mouth daily.  . Multiple Vitamin (  MULTI VITAMIN MENS PO) Take 1 tablet by mouth daily.   . Omega-3 Fatty Acids (FISH OIL) 1000 MG CAPS Take 1 capsule by mouth daily.  . Probiotic Product (PHILLIPS COLON HEALTH PO) Take 1 tablet by mouth daily.  . vitamin E 400 UNIT capsule Take 400 Units by mouth daily.  . [DISCONTINUED] trimethoprim (TRIMPEX) 100 MG tablet Take 1 tablet by mouth  every day  . [EXPIRED] cefTRIAXone (ROCEPHIN) injection 1 g    No facility-administered encounter medications on file as of 05/27/2017.           Objective:   Physical Exam  Constitutional: He is oriented to person, place, and time. He appears well-developed and well-nourished.  HENT:  Head: Normocephalic and atraumatic.  Cardiovascular: Normal rate, regular rhythm and normal heart sounds.   No carotid bruits  Pulmonary/Chest: Effort normal and breath sounds normal.  Abdominal: Soft. Bowel sounds are normal. He exhibits no distension and no mass. There is no tenderness. There is no rebound and no guarding.  Neurological: He is alert and oriented to person, place, and time.  Skin:  Skin is warm and dry.  Psychiatric: He has a normal mood and affect. His behavior is normal.        Assessment & Plan:  Suspect pyelonephritis-we'll send urine for culture for confirmation. In the meantime we'll give 1 g of Rocephin and start him on cefixime 40 mg daily for the next 14 days.If he has another fever overnight please call us back tomorrow morning. Okay to use ibuprofen and Tylenol for pain relief. Vitals are stable. He is drinking plenty of water.    He is interested in getting a flu vaccine and Shingrix vaccine because he is febrile we'll hold off and get next month.

## 2017-05-28 ENCOUNTER — Ambulatory Visit (INDEPENDENT_AMBULATORY_CARE_PROVIDER_SITE_OTHER): Payer: 59 | Admitting: Family Medicine

## 2017-05-28 ENCOUNTER — Ambulatory Visit: Payer: 59 | Admitting: Family Medicine

## 2017-05-28 VITALS — BP 118/69 | HR 88 | Temp 98.3°F

## 2017-05-28 DIAGNOSIS — N12 Tubulo-interstitial nephritis, not specified as acute or chronic: Secondary | ICD-10-CM

## 2017-05-28 DIAGNOSIS — R8299 Other abnormal findings in urine: Secondary | ICD-10-CM

## 2017-05-28 DIAGNOSIS — R8290 Unspecified abnormal findings in urine: Secondary | ICD-10-CM

## 2017-05-28 DIAGNOSIS — R82998 Other abnormal findings in urine: Secondary | ICD-10-CM

## 2017-05-28 MED ORDER — CEFTRIAXONE SODIUM 1 G IJ SOLR
1.0000 g | Freq: Once | INTRAMUSCULAR | Status: AC
  Administered 2017-05-28: 1 g via INTRAMUSCULAR

## 2017-05-28 NOTE — Progress Notes (Signed)
Agree with above. Patient spiked another fever 201 last night and that was with alternating ibuprofen and Tylenol. Asked him to come in ocephin injection. Continue to monitor for elevated fever and for improved response.

## 2017-05-28 NOTE — Progress Notes (Signed)
Pt came into clinic today for rocephin injection per PCP order. Pt is being treated for leukocytes in his urine, awaiting culture results. Per request, Rocephin 1g was split between each arm. Pt tolerated injections in left and right deltoid well, no immediate complication. He will continue oral antibiotics. Advised we will contact him once the urine culture results are ready. Verbalized understanding. No further questions.

## 2017-06-01 LAB — URINE CULTURE
MICRO NUMBER:: 81000380
SPECIMEN QUALITY:: ADEQUATE

## 2017-08-12 ENCOUNTER — Encounter: Payer: 59 | Admitting: Family Medicine

## 2017-08-12 ENCOUNTER — Ambulatory Visit (INDEPENDENT_AMBULATORY_CARE_PROVIDER_SITE_OTHER): Payer: 59 | Admitting: Family Medicine

## 2017-08-12 ENCOUNTER — Ambulatory Visit (INDEPENDENT_AMBULATORY_CARE_PROVIDER_SITE_OTHER): Payer: 59

## 2017-08-12 ENCOUNTER — Other Ambulatory Visit: Payer: Self-pay

## 2017-08-12 ENCOUNTER — Encounter: Payer: Self-pay | Admitting: Family Medicine

## 2017-08-12 VITALS — BP 135/79 | HR 84

## 2017-08-12 DIAGNOSIS — T148XXA Other injury of unspecified body region, initial encounter: Secondary | ICD-10-CM | POA: Diagnosis not present

## 2017-08-12 DIAGNOSIS — X58XXXS Exposure to other specified factors, sequela: Secondary | ICD-10-CM

## 2017-08-12 DIAGNOSIS — G8220 Paraplegia, unspecified: Secondary | ICD-10-CM | POA: Diagnosis not present

## 2017-08-12 DIAGNOSIS — Z0000 Encounter for general adult medical examination without abnormal findings: Secondary | ICD-10-CM | POA: Diagnosis not present

## 2017-08-12 DIAGNOSIS — Z23 Encounter for immunization: Secondary | ICD-10-CM | POA: Diagnosis not present

## 2017-08-12 DIAGNOSIS — M8580 Other specified disorders of bone density and structure, unspecified site: Secondary | ICD-10-CM | POA: Diagnosis not present

## 2017-08-12 DIAGNOSIS — R202 Paresthesia of skin: Secondary | ICD-10-CM | POA: Diagnosis not present

## 2017-08-12 DIAGNOSIS — M8589 Other specified disorders of bone density and structure, multiple sites: Secondary | ICD-10-CM

## 2017-08-12 NOTE — Addendum Note (Signed)
Addended by: [name] D on: 08/12/2017 12:35 PM   Modules accepted: Orders

## 2017-08-12 NOTE — Progress Notes (Signed)
Subjective:    Patient ID: Thomas Schroeder, male    DOB: 1961/09/13, 56 y.o.   MRN: [id number]  HPI 56-year-old male comes in today to follow-up for complete physical exam.  He is doing well overall.  He has not had any recurrent urinary symptoms.  He did want to let me know that he did get a new wheelchair and that has really made a big difference in discomfort and pain for him.  He would complain about his back hurting and that putting his fist behind his back even though he is numb technically in that area because of his paraplegia.  And he would come home ill and cranky and does not feel good in general.  But since getting his new wheelchair is made a huge difference in how he feels by the end of the day and is also noticed that his leg spasms have almost completely resolved and occur very infrequently now.  The only thing that is new since getting his new wheelchair is that he is getting persistent numbness and tingling in his thumb and first finger on his right hand.  It occasionally will affect the middle finger as well.  It does seem to be worse at night.  But is always there.  No neck pain or recent injury.  He reports that it occasionally will affect the thumb and the first finger on the left but that seems much more intermittent.  Itching his arm out completely at night can provide some temporary relief.   Review of Systems   Comprehensive ROS is negative except for numbness in right hand.  .   BP 135/79   Pulse 84   SpO2 100%     Allergies  Allergen Reactions  . Bactrim Rash    Skin sloughing   . Latex Rash    Past Medical History:  Diagnosis Date  . Allergy   . Chronic indwelling Foley catheter   . Chronic kidney disease   . ED (erectile dysfunction)   . History of recurrent UTIs   . Night muscle spasms   . Paraplegia [facility])     Past Surgical History:  Procedure Laterality Date  . APPENDECTOMY    . FEMUR FRACTURE SURGERY    . kidney stone removal    . RT tibia fracture     . T8 T9 fusion     . TONSILLECTOMY      Social History   Socioeconomic History  . Marital status: Divorced    Spouse name: Not on file  . Number of children: 2  . Years of education: Not on file  . Highest education level: Not on file  Social Needs  . Financial resource strain: Not on file  . Food insecurity - worry: Not on file  . Food insecurity - inability: Not on file  . Transportation needs - medical: Not on file  . Transportation needs - non-medical: Not on file  Occupational History    Employer: [employer]  Tobacco Use  . Smoking status: Never Smoker  . Smokeless tobacco: Never Used  Substance and Sexual Activity  . Alcohol use: Yes    Alcohol/week: 6.0 oz    Types: 10 Standard drinks or equivalent per week    Comment: per week  . Drug use: Not on file  . Sexual activity: Yes    Partners: Female  Other Topics Concern  . Not on file  Social History Narrative   1 caffeine drinks per day.  Some exercise.     Family History  Problem Relation Age of Onset  . Hyperlipidemia Mother   . Diabetes Mother   . Rosacea Mother   . Hypertension Neg Hx   . Lung cancer Mother        smoker    Outpatient Encounter Medications as of 08/12/2017  Medication Sig  . AMBULATORY NON FORMULARY MEDICATION Medication Name: hand control for vehicle operation. G82.2  . Ascorbic Acid (VITAMIN C) 100 MG tablet Take 100 mg by mouth daily.    Marland Kitchen aspirin 325 MG tablet Take 325 mg by mouth daily.    Marland Kitchen b complex vitamins tablet Take 1 tablet by mouth daily.    . cholecalciferol (VITAMIN D) 1000 UNITS tablet Take 1,000 Units by mouth daily.  Marland Kitchen CRANBERRY EXTRACT PO Take 1 tablet by mouth daily.   . Garlic 3151 MG CAPS Take 1 capsule by mouth daily.  . Multiple Vitamin (MULTI VITAMIN MENS PO) Take 1 tablet by mouth daily.   . Omega-3 Fatty Acids (FISH OIL) 1000 MG CAPS Take 1 capsule by mouth daily.  . Probiotic Product (PHILLIPS COLON HEALTH PO) Take 1 tablet by mouth daily.  .  vitamin E 400 UNIT capsule Take 400 Units by mouth daily.  . [DISCONTINUED] Cefixime (SUPRAX) 400 MG CAPS capsule Take 1 capsule (400 mg total) by mouth daily. X 14 days   No facility-administered encounter medications on file as of 08/12/2017.         Objective:   Physical Exam  Constitutional: He is oriented to person, place, and time. He appears well-developed and well-nourished.  Sitting in wheelchair.   HENT:  Head: Normocephalic and atraumatic.  Right Ear: External ear normal.  Left Ear: External ear normal.  Nose: Nose normal.  Mouth/Throat: Oropharynx is clear and moist.  Eyes: Conjunctivae and EOM are normal. Pupils are equal, round, and reactive to light.  Neck: Normal range of motion. Neck supple. No thyromegaly present.  Cardiovascular: Normal rate, regular rhythm, normal heart sounds and intact distal pulses.  No carotid bruits  Pulmonary/Chest: Effort normal and breath sounds normal.  Abdominal: Soft. Bowel sounds are normal. He exhibits no distension and no mass. There is no tenderness.  Musculoskeletal:  Cervical spine with normal range of motion.  Lymphadenopathy:    He has no cervical adenopathy.  Neurological: He is alert and oriented to person, place, and time. He has normal reflexes.  Wrist with normal range of motion bilaterally.  Negative Tinel's and Phalen's sign on the right.  Normal range of motion of fingers.  Skin: Skin is warm and dry.  Psychiatric: He has a normal mood and affect. His behavior is normal. Judgment and thought content normal.        Assessment & Plan:  Keep up a regular exercise program and make sure you are eating a healthy diet Try to eat 4 servings of dairy a day, or if you are lactose intolerant take a calcium with vitamin D daily.  Your vaccines are up to date.  Check psa.    Flu vaccine given. For shingles vaccine given.  Follow-up in 2-6 months for second injection.  Paresthesias tingling in the right hand-carpal tunnel  versus cervical issue.  We will start with plain film x-ray of the neck and order EMG study.

## 2017-08-12 NOTE — Patient Instructions (Addendum)

## 2017-08-13 ENCOUNTER — Ambulatory Visit (INDEPENDENT_AMBULATORY_CARE_PROVIDER_SITE_OTHER): Payer: 59

## 2017-08-13 DIAGNOSIS — M8588 Other specified disorders of bone density and structure, other site: Secondary | ICD-10-CM | POA: Diagnosis not present

## 2017-08-13 DIAGNOSIS — M8589 Other specified disorders of bone density and structure, multiple sites: Secondary | ICD-10-CM

## 2017-08-13 LAB — COMPLETE METABOLIC PANEL WITH GFR
AG Ratio: 1.9 (calc) (ref 1.0–2.5)
ALT: 34 U/L (ref 9–46)
AST: 21 U/L (ref 10–35)
Albumin: 4.3 g/dL (ref 3.6–5.1)
Alkaline phosphatase (APISO): 65 U/L (ref 40–115)
BUN: 19 mg/dL (ref 7–25)
CO2: 29 mmol/L (ref 20–32)
Calcium: 9.1 mg/dL (ref 8.6–10.3)
Chloride: 102 mmol/L (ref 98–110)
Creat: 0.9 mg/dL (ref 0.70–1.33)
GFR, Est African American: 109 mL/min/{1.73_m2} (ref >=60)
GFR, Est Non African American: 94 mL/min/{1.73_m2} (ref >=60)
Globulin: 2.3 g/dL (calc) (ref 1.9–3.7)
Glucose, Bld: 104 mg/dL — ABNORMAL HIGH (ref 65–99)
Potassium: 4.4 mmol/L (ref 3.5–5.3)
Sodium: 138 mmol/L (ref 135–146)
Total Bilirubin: 0.5 mg/dL (ref 0.2–1.2)
Total Protein: 6.6 g/dL (ref 6.1–8.1)

## 2017-08-13 LAB — LIPID PANEL
Cholesterol: 230 mg/dL — ABNORMAL HIGH (ref <200)
HDL: 49 mg/dL (ref >40)
LDL Cholesterol (Calc): 139 mg/dL (calc) — ABNORMAL HIGH
Non-HDL Cholesterol (Calc): 181 mg/dL (calc) — ABNORMAL HIGH (ref <130)
Total CHOL/HDL Ratio: 4.7 (calc) (ref <5.0)
Triglycerides: 294 mg/dL — ABNORMAL HIGH (ref <150)

## 2017-08-13 LAB — PSA: PSA: 0.7 ng/mL (ref <=4.0)

## 2017-10-02 ENCOUNTER — Encounter: Payer: Self-pay | Admitting: Family Medicine

## 2017-10-08 ENCOUNTER — Other Ambulatory Visit: Payer: Self-pay | Admitting: Family Medicine

## 2017-10-08 MED ORDER — OMEPRAZOLE 40 MG PO CPDR: 40.0000 mg | DELAYED_RELEASE_CAPSULE | Freq: Every day | ORAL | 3 refills | Status: DC

## 2017-12-01 ENCOUNTER — Encounter: Payer: Self-pay | Admitting: Family Medicine

## 2017-12-01 ENCOUNTER — Ambulatory Visit (INDEPENDENT_AMBULATORY_CARE_PROVIDER_SITE_OTHER): Payer: 59 | Admitting: Family Medicine

## 2017-12-01 VITALS — BP 134/87 | HR 80 | Temp 97.9°F

## 2017-12-01 DIAGNOSIS — R300 Dysuria: Secondary | ICD-10-CM | POA: Diagnosis not present

## 2017-12-01 DIAGNOSIS — Z23 Encounter for immunization: Secondary | ICD-10-CM

## 2017-12-01 DIAGNOSIS — N3000 Acute cystitis without hematuria: Secondary | ICD-10-CM | POA: Diagnosis not present

## 2017-12-01 DIAGNOSIS — M62838 Other muscle spasm: Secondary | ICD-10-CM | POA: Diagnosis not present

## 2017-12-01 LAB — POCT URINALYSIS DIPSTICK
Bilirubin, UA: NEGATIVE
Glucose, UA: NEGATIVE
Ketones, UA: NEGATIVE
Nitrite, UA: POSITIVE
Protein, UA: NEGATIVE
Spec Grav, UA: 1.01 (ref 1.010–1.025)
Urobilinogen, UA: 0.2 E.U./dL
pH, UA: 7 (ref 5.0–8.0)

## 2017-12-01 MED ORDER — DIAZEPAM 10 MG PO TABS: 20.0000 mg | ORAL_TABLET | Freq: Three times a day (TID) | ORAL | 1 refills | Status: DC | PRN

## 2017-12-01 NOTE — Progress Notes (Signed)
Subjective:    Patient ID: Thomas Schroeder, male    DOB: [birth year], 57 y.o.   MRN: [id number]  HPI  57 yo paraplegic comes in today for urinary symptoms that he is really had for at least 2 months with a very foul odor to his urine.  He really has not had any fever sweats or chills which delayed him coming in.  He had changed his supplements around the thought maybe initially it was from that but it just has not gone back to normal.  He went back to taking his old cranberry pill he had a substitute for short period of time.  Here for 2nd Shingrix.    He also wanted to know if I would be willing to take over his diazepam prescription.  He has been seeing neurology for several years.  They mostly just refill his diazepam and write him new prescriptions for his wheelchair when he needs it.  Is been going to [facility] but his neurologist, Dr. Bella Kennedy, recently left.  They sent him a note which he brought in today requesting that they have his PCP take over this medication but they did send him in a short supply of 30 days with 1 refill.  He typically takes 2 diazepam before bedtime and then often in the middle the night will wake up and take 2 more.  This helps him rest well and keeps him from having to move so much at night with his sleep.  And occasionally he will use it during the daytime but not regularly.   Review of Systems BP 134/87   Pulse 80   Temp 97.9 F (36.6 C)   SpO2 100%     Allergies  Allergen Reactions  . Bactrim Rash    Skin sloughing   . Latex Rash    Past Medical History:  Diagnosis Date  . Allergy   . Chronic indwelling Foley catheter   . Chronic kidney disease   . ED (erectile dysfunction)   . History of recurrent UTIs   . Night muscle spasms   . Paraplegia [facility])     Past Surgical History:  Procedure Laterality Date  . APPENDECTOMY    . FEMUR FRACTURE SURGERY    . kidney stone removal    . RT tibia fracture    . T8 T9 fusion     . TONSILLECTOMY       Social History   Socioeconomic History  . Marital status: Divorced    Spouse name: Not on file  . Number of children: 2  . Years of education: Not on file  . Highest education level: Not on file  Social Needs  . Financial resource strain: Not on file  . Food insecurity - worry: Not on file  . Food insecurity - inability: Not on file  . Transportation needs - medical: Not on file  . Transportation needs - non-medical: Not on file  Occupational History    Employer: [employer]  Tobacco Use  . Smoking status: Never Smoker  . Smokeless tobacco: Never Used  Substance and Sexual Activity  . Alcohol use: Yes    Alcohol/week: 6.0 oz    Types: 10 Standard drinks or equivalent per week    Comment: per week  . Drug use: Not on file  . Sexual activity: Yes    Partners: Female  Other Topics Concern  . Not on file  Social History Narrative   1 caffeine drinks per day.  Some  exercise.     Family History  Problem Relation Age of Onset  . Hyperlipidemia Mother   . Diabetes Mother   . Rosacea Mother   . Hypertension Neg Hx   . Lung cancer Mother        smoker    Outpatient Encounter Medications as of 12/01/2017  Medication Sig  . AMBULATORY NON FORMULARY MEDICATION Medication Name: hand control for vehicle operation. G82.2  . aspirin 325 MG tablet Take 325 mg by mouth daily.    . cholecalciferol (VITAMIN D) 1000 UNITS tablet Take 2,000 Units by mouth daily.   Marland Kitchen CRANBERRY EXTRACT PO Take 4,200 mg by mouth daily.   . diazepam (VALIUM) 10 MG tablet Take 2 tablets (20 mg total) by mouth every 8 (eight) hours as needed.  . Multiple Vitamin (MULTI VITAMIN MENS PO) Take 1 tablet by mouth daily.   . Omega-3 Fatty Acids (FISH OIL) 1000 MG CAPS Take 1 capsule by mouth daily.  . [DISCONTINUED] diazepam (VALIUM) 10 MG tablet Take 10 mg by mouth every 8 (eight) hours as needed.  . [DISCONTINUED] Ascorbic Acid (VITAMIN C) 100 MG tablet Take 100 mg by mouth daily.    . [DISCONTINUED] b  complex vitamins tablet Take 1 tablet by mouth daily.    . [DISCONTINUED] Garlic 5093 MG CAPS Take 1 capsule by mouth daily.  . [DISCONTINUED] omeprazole (PRILOSEC) 40 MG capsule Take 1 capsule (40 mg total) by mouth daily.  . [DISCONTINUED] Probiotic Product (PHILLIPS COLON HEALTH PO) Take 1 tablet by mouth daily.  . [DISCONTINUED] vitamin E 400 UNIT capsule Take 400 Units by mouth daily.   No facility-administered encounter medications on file as of 12/01/2017.          Objective:   Physical Exam  Constitutional: He is oriented to person, place, and time. He appears well-developed and well-nourished.  HENT:  Head: Normocephalic and atraumatic.  Cardiovascular: Normal rate, regular rhythm and normal heart sounds.  Pulmonary/Chest: Effort normal and breath sounds normal.  Neurological: He is alert and oriented to person, place, and time.  Skin: Skin is warm and dry.  Psychiatric: He has a normal mood and affect. His behavior is normal.        Assessment & Plan:  Urinary  tract infection-we will send urine for culture we will call him once the results are available.  He is likely colonized but he has been symptomatic.  Muscle spasms particularly of the legs secondary to his paraplegia-I will be happy to take over his diazepam prescription.  [facility] for the next 6 months.  I did explain to him he would need to come in twice a year since it is a scheduled drug.  Pleated shingles vaccine series today.

## 2017-12-03 LAB — URINE CULTURE
MICRO NUMBER:: 90339354
SPECIMEN QUALITY:: ADEQUATE

## 2017-12-10 ENCOUNTER — Ambulatory Visit: Payer: 59

## 2017-12-19 ENCOUNTER — Telehealth: Payer: Self-pay | Admitting: *Deleted

## 2017-12-19 DIAGNOSIS — G8220 Paraplegia, unspecified: Secondary | ICD-10-CM

## 2017-12-19 MED ORDER — AMBULATORY NON FORMULARY MEDICATION: 0 refills | Status: DC

## 2017-12-19 NOTE — Telephone Encounter (Signed)
Order for Tek RMD placed and faxed.Thomas Schroeder, [place]

## 2017-12-25 ENCOUNTER — Ambulatory Visit (INDEPENDENT_AMBULATORY_CARE_PROVIDER_SITE_OTHER): Payer: 59 | Admitting: Family Medicine

## 2017-12-25 ENCOUNTER — Telehealth: Payer: Self-pay | Admitting: *Deleted

## 2017-12-25 ENCOUNTER — Encounter: Payer: Self-pay | Admitting: Family Medicine

## 2017-12-25 VITALS — BP 134/81 | HR 102 | Temp 99.7°F

## 2017-12-25 DIAGNOSIS — N12 Tubulo-interstitial nephritis, not specified as acute or chronic: Secondary | ICD-10-CM

## 2017-12-25 LAB — POCT URINALYSIS DIPSTICK
Bilirubin, UA: NEGATIVE
Glucose, UA: NEGATIVE
Ketones, UA: NEGATIVE
Nitrite, UA: NEGATIVE
Protein, UA: NEGATIVE
Spec Grav, UA: 1.01 (ref 1.010–1.025)
Urobilinogen, UA: 0.2 E.U./dL
pH, UA: 7 (ref 5.0–8.0)

## 2017-12-25 MED ORDER — CEFTRIAXONE SODIUM 1 G IJ SOLR
1.0000 g | Freq: Once | INTRAMUSCULAR | Status: AC
  Administered 2017-12-25: 1 g via INTRAMUSCULAR

## 2017-12-25 MED ORDER — CEFIXIME 400 MG PO CAPS: 400.0000 mg | ORAL_CAPSULE | Freq: Every day | ORAL | 0 refills | Status: DC

## 2017-12-25 NOTE — Telephone Encounter (Signed)
Spoke w/Natasha and asked that she transfer his Abx to the [facility] in [place]. She was able to do this.Elouise Munroe, [place]

## 2017-12-25 NOTE — Progress Notes (Signed)
Subjective:    Patient ID: Thomas Schroeder, male    DOB: March 07, 1961, 57 y.o.   MRN: [id number]  HPI Yesterday started to get some chills yesterday at work. WEnt home and went home and temp was 100.3. Then in middle of the night temp went up to 102.8 and took some more Tylenol.  She is getting severe abdominal spams before voiding.  He would get spasms in his abdomen that would shake down his legs and then noticed that he was voiding and through his catheter into the back overnight.  Temp 99.7 here.  He is clearing his throat a lot.   No there ST or cough, etc. no nausea or vomiting.  He just feels like he is mentally not quite as sharp today.  Yesterday he had a severe headache in the evening today which is more of a mild low-grade headache.  He feels like the throat clearing is more from allergies.  He said he drink plenty of fluid last night he had about 316 ounce bottles and urinated well.  So he does not feel like it is any type of blockage.   Review of Systems  BP 134/81   Pulse (!) 102   Temp 99.7 F (37.6 C)   SpO2 98%     Allergies  Allergen Reactions  . Bactrim Rash    Skin sloughing   . Latex Rash    Past Medical History:  Diagnosis Date  . Allergy   . Chronic indwelling Foley catheter   . Chronic kidney disease   . ED (erectile dysfunction)   . History of recurrent UTIs   . Night muscle spasms   . Paraplegia [facility])     Past Surgical History:  Procedure Laterality Date  . APPENDECTOMY    . FEMUR FRACTURE SURGERY    . kidney stone removal    . RT tibia fracture    . T8 T9 fusion     . TONSILLECTOMY      Social History   Socioeconomic History  . Marital status: Divorced    Spouse name: Not on file  . Number of children: 2  . Years of education: Not on file  . Highest education level: Not on file  Occupational History    Employer: [employer]  Social Needs  . Financial resource strain: Not on file  . Food insecurity:    Worry: Not on file    Inability:  Not on file  . Transportation needs:    Medical: Not on file    Non-medical: Not on file  Tobacco Use  . Smoking status: Never Smoker  . Smokeless tobacco: Never Used  Substance and Sexual Activity  . Alcohol use: Yes    Alcohol/week: 6.0 oz    Types: 10 Standard drinks or equivalent per week    Comment: per week  . Drug use: Not on file  . Sexual activity: Yes    Partners: Female  Lifestyle  . Physical activity:    Days per week: Not on file    Minutes per session: Not on file  . Stress: Not on file  Relationships  . Social connections:    Talks on phone: Not on file    Gets together: Not on file    Attends religious service: Not on file    Active member of club or organization: Not on file    Attends meetings of clubs or organizations: Not on file    Relationship status: Not on file  .  Intimate partner violence:    Fear of current or ex partner: Not on file    Emotionally abused: Not on file    Physically abused: Not on file    Forced sexual activity: Not on file  Other Topics Concern  . Not on file  Social History Narrative   1 caffeine drinks per day.  Some exercise.     Family History  Problem Relation Age of Onset  . Hyperlipidemia Mother   . Diabetes Mother   . Rosacea Mother   . Hypertension Neg Hx   . Lung cancer Mother        smoker    Outpatient Encounter Medications as of 12/25/2017  Medication Sig  . AMBULATORY NON FORMULARY MEDICATION Medication Name: hand control for vehicle operation. G82.2  . AMBULATORY NON FORMULARY MEDICATION Medication Name: Tek RMD Dx: Paraplegia G82.80 Fax: [employer]: [phone]  . aspirin 325 MG tablet Take 325 mg by mouth daily.    . cholecalciferol (VITAMIN D) 1000 UNITS tablet Take 2,000 Units by mouth daily.   . diazepam (VALIUM) 10 MG tablet Take 2 tablets (20 mg total) by mouth every 8 (eight) hours as needed.  . Multiple Vitamin (MULTI VITAMIN MENS PO) Take 1 tablet by mouth daily.   . Omega-3 Fatty Acids (FISH  OIL) 1000 MG CAPS Take 1 capsule by mouth daily.  . [DISCONTINUED] cefixime (SUPRAX) 400 MG CAPS capsule Take 1 capsule (400 mg total) by mouth daily. X 14 days  . [DISCONTINUED] CRANBERRY EXTRACT PO Take 4,200 mg by mouth daily.   . [EXPIRED] cefTRIAXone (ROCEPHIN) injection 1 g    No facility-administered encounter medications on file as of 12/25/2017.          Objective:   Physical Exam  Constitutional: He is oriented to person, place, and time. He appears well-developed and well-nourished.  Sitting in wheelchair.   HENT:  Head: Normocephalic and atraumatic.  Right Ear: External ear normal.  Left Ear: External ear normal.  Nose: Nose normal.  Mouth/Throat: Oropharynx is clear and moist.  TMs and canals are clear.   Eyes: Pupils are equal, round, and reactive to light. Conjunctivae and EOM are normal.  Neck: Neck supple. No thyromegaly present.  Cardiovascular: Normal rate and normal heart sounds.  Pulmonary/Chest: Effort normal and breath sounds normal.  Abdominal: Soft. Bowel sounds are normal. He exhibits no distension and no mass. There is no tenderness. There is no rebound and no guarding.  Lymphadenopathy:    He has no cervical adenopathy.  Neurological: He is alert and oriented to person, place, and time.  Skin: Skin is warm and dry.  Psychiatric: He has a normal mood and affect.  Nursing note and vitals reviewed.     Assessment & Plan:  Pyelonephritis-he had a similar episode about 7 months ago in September.  We will treat with 1 g of Rocephin today. We will have him return for second dose tomorrow.  Go ahead and start him on cefixime.   Continue to monitor fever and for mental status changes.  Continue Tylenol as needed for fever.  Urinalysis performed today which did show some blood and leukocytes.  Will send for culture as well though he is likely colonized.

## 2017-12-25 NOTE — Progress Notes (Signed)
Pt given rocephin injection. He tolerated well. I had pt to wait in exam room for 10 mins for any adverse reactions to the medication.Elouise Munroe, [place]

## 2017-12-26 ENCOUNTER — Ambulatory Visit (INDEPENDENT_AMBULATORY_CARE_PROVIDER_SITE_OTHER): Payer: 59 | Admitting: Family Medicine

## 2017-12-26 VITALS — BP 101/63 | HR 85 | Temp 99.1°F

## 2017-12-26 DIAGNOSIS — N12 Tubulo-interstitial nephritis, not specified as acute or chronic: Secondary | ICD-10-CM

## 2017-12-26 MED ORDER — CEFTRIAXONE SODIUM 1 G IJ SOLR: 1.0000 g | INTRAMUSCULAR | Status: DC

## 2017-12-26 NOTE — Progress Notes (Signed)
   Subjective:    Patient ID: Thomas Schroeder, male    DOB: [birth year], 57 y.o.   MRN: [id number]  HPI  Pyelonephritis- Dequincy is here for a 2nd injection of Rocephin. He reports no problems with the first injection. He reports his fevers have been lower than 100.   Review of Systems     Objective:   Physical Exam        Assessment & Plan:  Pyelonephritis - Patient tolerated injection well without complications. Patient advised to call back if fevers persist or he becomes weak.

## 2017-12-26 NOTE — Progress Notes (Signed)
Perfect, he is doing much better and now on oral antibiotic. Culture still pending.   [name], MD

## 2017-12-27 LAB — URINE CULTURE
MICRO NUMBER:: 90448529
SPECIMEN QUALITY:: ADEQUATE

## 2018-01-29 ENCOUNTER — Encounter: Payer: Self-pay | Admitting: Family Medicine

## 2018-01-30 ENCOUNTER — Ambulatory Visit (INDEPENDENT_AMBULATORY_CARE_PROVIDER_SITE_OTHER): Payer: 59 | Admitting: Physician Assistant

## 2018-01-30 ENCOUNTER — Encounter: Payer: Self-pay | Admitting: Physician Assistant

## 2018-01-30 VITALS — BP 147/83 | HR 88 | Temp 97.8°F

## 2018-01-30 DIAGNOSIS — K130 Diseases of lips: Secondary | ICD-10-CM

## 2018-01-30 DIAGNOSIS — T783XXA Angioneurotic edema, initial encounter: Secondary | ICD-10-CM

## 2018-01-30 DIAGNOSIS — R432 Parageusia: Secondary | ICD-10-CM | POA: Diagnosis not present

## 2018-01-30 MED ORDER — RANITIDINE HCL 300 MG PO TABS: 300.0000 mg | ORAL_TABLET | Freq: Two times a day (BID) | ORAL | 0 refills | Status: DC

## 2018-01-30 MED ORDER — HYDROCORTISONE 1 % EX OINT: TOPICAL_OINTMENT | CUTANEOUS | 0 refills | Status: DC

## 2018-01-30 MED ORDER — CETIRIZINE HCL 10 MG PO TABS: 10.0000 mg | ORAL_TABLET | Freq: Two times a day (BID) | ORAL | 0 refills | Status: DC

## 2018-01-30 NOTE — Progress Notes (Signed)
HPI:                                                                Thomas Schroeder is a 57 y.o. male who presents to [facility] [place] today for mouth/lip swelling  Reports 1 month of burning mouth pain, swollen lips, and drooling. Endorses altered taste, everything tastes salty. Inside of his mouth feels swollen and irritated, worsened by using Listerine. He noted some peeling, dead skin. Also endorses sensitivity to salt and spice. Tongue is more sensitive to brushing/oral care. He was treated with Suprax on 12/25/17 for acute pyelonephritis and symptoms began soon after. Hx of allergies to Bactrim and Latex. He stopped Listerine and this helped with discomfort. He changed his tooth brush and oral hygiene habits without improvement.   No flowsheet data found.    Past Medical History:  Diagnosis Date  . Allergy   . Chronic indwelling Foley catheter   . Chronic kidney disease   . ED (erectile dysfunction)   . History of recurrent UTIs   . Night muscle spasms   . Paraplegia [facility])    Past Surgical History:  Procedure Laterality Date  . APPENDECTOMY    . FEMUR FRACTURE SURGERY    . kidney stone removal    . RT tibia fracture    . T8 T9 fusion     . TONSILLECTOMY     Social History   Tobacco Use  . Smoking status: Never Smoker  . Smokeless tobacco: Never Used  Substance Use Topics  . Alcohol use: Yes    Alcohol/week: 6.0 oz    Types: 10 Standard drinks or equivalent per week    Comment: per week   family history includes Diabetes in his mother; Hyperlipidemia in his mother; Lung cancer in his mother; Rosacea in his mother.    ROS: negative except as noted in the HPI  Medications: Current Outpatient Medications  Medication Sig Dispense Refill  . AMBULATORY NON FORMULARY MEDICATION Medication Name: hand control for vehicle operation. G82.2 1 each 0  . AMBULATORY NON FORMULARY MEDICATION Medication Name: Tek RMD Dx: Paraplegia  G82.80 Fax: [employer]: [phone] 1 each 0  . aspirin 325 MG tablet Take 325 mg by mouth daily.      . cholecalciferol (VITAMIN D) 1000 UNITS tablet Take 2,000 Units by mouth daily.     Marland Kitchen CRANBERRY CONCENTRATE PO Take by mouth.    . diazepam (VALIUM) 10 MG tablet Take 2 tablets (20 mg total) by mouth every 8 (eight) hours as needed. 540 tablet 1  . Multiple Vitamin (MULTI VITAMIN MENS PO) Take 1 tablet by mouth daily.     . Omega-3 Fatty Acids (FISH OIL) 1000 MG CAPS Take 1 capsule by mouth daily.     Current Facility-Administered Medications  Medication Dose Route Frequency Provider Last Rate Last Dose  . cefTRIAXone (ROCEPHIN) injection 1 g  1 g Intramuscular Q24H Hali Marry, MD       Allergies  Allergen Reactions  . Bactrim Rash    Skin sloughing   . Latex Rash       Objective:  BP (!) 147/83   Pulse 88   Temp 97.8 F (36.6 C) (Oral)   SpO2 98%  Gen:  alert, not ill-appearing, no distress, appropriate for age 85: head normocephalic without obvious abnormality, conjunctiva and cornea clear, lips are mildly swollen and tender, dry, cracked skin at the mouth angles bilaterally, oral mucosa without ulcerations or lesions, tongue without swelling, oropharynx clear, no edema, uvula midline, neck supple, no cervical adenopathy, trachea midline Pulm: Normal work of breathing, normal phonation, clear to auscultation bilaterally, no wheezes, rales or rhonchi CV: Normal rate, regular rhythm, s1 and s2 distinct, no murmurs, clicks or rubs  Neuro: alert and oriented x 3, no tremor MSK: extremities atraumatic, normal gait and station Skin: intact, no rashes on exposed skin, no jaundice, no cyanosis Psych: well-groomed, cooperative, good eye contact, euthymic mood, affect mood-congruent, speech is articulate, and thought processes clear and goal-directed    No results found for this or any previous visit (from the past 72 hour(s)). No results found.    Assessment and  Plan: 57 y.o. male with   Angular cheilitis - Plan: hydrocortisone 1 % ointment  Angioedema of lips, initial encounter - Plan: ranitidine (ZANTAC) 300 MG tablet, cetirizine (ZYRTEC) 10 MG tablet  Altered taste   - Cetirizine and Rantidine twice a day for 1 week. - mechanically soft diet  - avoid acidic and spicy foods - apply Hydrocortisone 1% twice a day to angles of the mouth for 1 week - Biotene mouth rinse as needed for burning/discomfort - follow-up with Allergy/Immunology if no improvement  Patient education and anticipatory guidance given Patient agrees with treatment plan Follow-up as needed if symptoms worsen or fail to improve  Darlyne Russian PA-C

## 2018-01-30 NOTE — Patient Instructions (Addendum)
- Cetirizine and Rantidine twice a day for 1 week. These are both antihistamines that act on different histamine receptors. This will help if this reaction is histamine-mediated - mechanically soft diet  - avoid acidic and spicy foods - apply Hydrocortisone 1% twice a day to angles of the mouth for 1 week - Biotene mouth rinse as needed for burning/discomfort  Angioedema Angioedema is the sudden swelling of tissue in the body. Angioedema can affect any part of the body, but it most often affects the deeper parts of the skin, causing red, itchy patches (hives) to appear over the affected area. It often begins during the night and is found in the morning. Depending on the cause, angioedema may happen:  Only once.  Several times. It may come back in unpredictable patterns.  Repeatedly for several years. Over time, it may gradually stop coming back.  Angioedema can be life-threatening if it affects the air passages that you breathe through. What are the causes? This condition may be caused by:  Foods, such as milk, eggs, shellfish, wheat, or nuts.  Certain medicines, such as ACE inhibitors, antibiotics, nonsteroidal anti-inflammatory drugs, birth control pills, or dyes used in X-rays.  Insect stings.  Infections.  Angioedema can be inherited, and episodes can be triggered by:  Mild injury.  Dental work.  Surgery.  Stress.  Sudden changes in temperature.  Exercise.  In some cases, the cause of this condition is not known. What are the signs or symptoms? Symptoms of this condition depend on where the swelling happens. Symptoms may include:  Swollen skin.  Red, itchy patches of skin (hives).  Redness in the affected area.  Pain in the affected area.  Swollen lips or tongue.  Wheezing.  Breathing problems.  If your internal organs are involved, symptoms may also include:  Nausea.  Abdominal pain.  Vomiting.  Difficulty swallowing.  Difficulty passing  urine.  How is this diagnosed? This condition may be diagnosed based on:  An exam of the affected area.  Your medical history.  Whether anyone in your family has had this condition before.  A review of any medicines you have been taking.  Tests, including: ? Allergy skin tests to see if the condition was caused by an allergic reaction. ? Blood tests to see if the condition was caused by a gene. ? Tests to check for underlying diseases that could cause the condition.  How is this treated? Treatment for this condition depends on the cause. It may involve any of the following:  If something triggered the condition, making changes to keep it from triggering the condition again.  If the condition affects your breathing, having tubes placed in your airway to keep it open.  Taking medicines to treat symptoms or prevent future episodes. These may include: ? Antihistamines. ? Epinephrine injections. ? Steroids.  If your condition is severe, you may need to be treated at the hospital. Angioedema usually gets better in 24-48 hours. Follow these instructions at home:  Take over-the-counter and prescription medicines only as told by your health care provider.  If you were given medicines for emergency allergy treatment, always carry them with you.  Wear a medical bracelet as told by your health care provider.  If something triggers your condition, avoid the trigger, if possible.  If your condition is inherited and you are thinking about having children, talk to your health care provider. It is important to discuss the risks of passing on the condition to your children. Contact a  health care provider if:  You have repeated episodes of angioedema.  Episodes of angioedema start to happen more often than they used to, even after you take steps to prevent them.  You have episodes of angioedema that are more severe than they have been before, even after you take steps to prevent  them.  You are thinking about having children. Get help right away if:  You have severe swelling of your mouth, tongue, or lips.  You have trouble breathing.  You have trouble swallowing.  You faint. This information is not intended to replace advice given to you by your health care provider. Make sure you discuss any questions you have with your health care provider. Document Released: 11/11/2001 Document Revised: 03/30/2016 Document Reviewed: 03/12/2016 Elsevier Interactive Patient Education  [employer].

## 2018-02-04 ENCOUNTER — Encounter: Payer: Self-pay | Admitting: Physician Assistant

## 2018-02-09 ENCOUNTER — Encounter: Payer: Self-pay | Admitting: Physician Assistant

## 2018-02-09 DIAGNOSIS — T783XXA Angioneurotic edema, initial encounter: Secondary | ICD-10-CM | POA: Insufficient documentation

## 2018-02-09 DIAGNOSIS — R432 Parageusia: Secondary | ICD-10-CM | POA: Insufficient documentation

## 2018-02-09 DIAGNOSIS — K130 Diseases of lips: Secondary | ICD-10-CM | POA: Insufficient documentation

## 2018-06-01 ENCOUNTER — Ambulatory Visit (INDEPENDENT_AMBULATORY_CARE_PROVIDER_SITE_OTHER): Payer: 59 | Admitting: Family Medicine

## 2018-06-01 ENCOUNTER — Encounter: Payer: Self-pay | Admitting: Family Medicine

## 2018-06-01 VITALS — BP 138/82 | HR 95 | Temp 98.5°F

## 2018-06-01 DIAGNOSIS — R8290 Unspecified abnormal findings in urine: Secondary | ICD-10-CM | POA: Diagnosis not present

## 2018-06-01 DIAGNOSIS — Z23 Encounter for immunization: Secondary | ICD-10-CM | POA: Diagnosis not present

## 2018-06-01 DIAGNOSIS — Z125 Encounter for screening for malignant neoplasm of prostate: Secondary | ICD-10-CM | POA: Diagnosis not present

## 2018-06-01 LAB — POCT URINALYSIS DIPSTICK
Bilirubin, UA: NEGATIVE
Blood, UA: NEGATIVE
Glucose, UA: NEGATIVE
Ketones, UA: NEGATIVE
Nitrite, UA: NEGATIVE
Protein, UA: NEGATIVE
Spec Grav, UA: 1.01 (ref 1.010–1.025)
Urobilinogen, UA: 0.2 E.U./dL
pH, UA: 7 (ref 5.0–8.0)

## 2018-06-01 NOTE — Progress Notes (Signed)
Subjective:    Patient ID: Thomas Schroeder, male    DOB: Jan 16, 1961, 57 y.o.   MRN: [id number]  HPI   57 yo male paraplegic is here today bc he is worried he may have a UTI.  His diagnosis pyelonephritis back in April of this year.  He reports that on and off since November of last year he is just had a bad odor to his urine that seems to come and go.  He initially noticed it when he switch from caffeinated coffee to decaf.  He then switched back to regular coffee and it did seem to get better for a while but it has returned on and off since then.  He says it really what he describes as a rancid odor.  He does notice that if he changes his catheter little more often it does seem to be better.  Normally he changes it every 4 to 6 weeks but more recently he is been changing it every 2 weeks.  He denies any blood in the urine.  No abdominal pain or cramps.  No fevers chills or sweats.  He has some persistent left sided pain over the rib area but that has been there for quite some time and he feels is probably unrelated.  No back pain.  7 appointment coming up with a new urologist but wanted to come in and just check some things preliminarily.  He got quite sick when he had pyelonephritis back in April.  Review of Systems  BP 138/82   Pulse 95   Temp 98.5 F (36.9 C)   SpO2 95%     Allergies  Allergen Reactions  . Bactrim Rash    Skin sloughing   . Suprax [Cefixime] Swelling  . Latex Rash    Past Medical History:  Diagnosis Date  . Allergy   . Chronic indwelling Foley catheter   . Chronic kidney disease   . ED (erectile dysfunction)   . History of recurrent UTIs   . Night muscle spasms   . Paraplegia [facility])     Past Surgical History:  Procedure Laterality Date  . APPENDECTOMY    . FEMUR FRACTURE SURGERY    . kidney stone removal    . RT tibia fracture    . T8 T9 fusion     . TONSILLECTOMY      Social History   Socioeconomic History  . Marital status: Divorced    Spouse name:  Not on file  . Number of children: 2  . Years of education: Not on file  . Highest education level: Not on file  Occupational History    Employer: [employer]  Social Needs  . Financial resource strain: Not on file  . Food insecurity:    Worry: Not on file    Inability: Not on file  . Transportation needs:    Medical: Not on file    Non-medical: Not on file  Tobacco Use  . Smoking status: Never Smoker  . Smokeless tobacco: Never Used  Substance and Sexual Activity  . Alcohol use: Yes    Alcohol/week: 10.0 standard drinks    Types: 10 Standard drinks or equivalent per week    Comment: per week  . Drug use: Not on file  . Sexual activity: Yes    Partners: Female  Lifestyle  . Physical activity:    Days per week: Not on file    Minutes per session: Not on file  . Stress: Not on file  Relationships  . Social connections:    Talks on phone: Not on file    Gets together: Not on file    Attends religious service: Not on file    Active member of club or organization: Not on file    Attends meetings of clubs or organizations: Not on file    Relationship status: Not on file  . Intimate partner violence:    Fear of current or ex partner: Not on file    Emotionally abused: Not on file    Physically abused: Not on file    Forced sexual activity: Not on file  Other Topics Concern  . Not on file  Social History Narrative   1 caffeine drinks per day.  Some exercise.     Family History  Problem Relation Age of Onset  . Hyperlipidemia Mother   . Diabetes Mother   . Rosacea Mother   . Hypertension Neg Hx   . Lung cancer Mother        smoker    Outpatient Encounter Medications as of 06/01/2018  Medication Sig  . aspirin 325 MG tablet Take 325 mg by mouth daily.    . cholecalciferol (VITAMIN D) 1000 UNITS tablet Take 2,000 Units by mouth daily.   Marland Kitchen CRANBERRY EXTRACT PO Take 1 capsule by mouth daily.  . diazepam (VALIUM) 10 MG tablet Take 2 tablets (20 mg total) by mouth  every 8 (eight) hours as needed.  . Multiple Vitamin (MULTI VITAMIN MENS PO) Take 1 tablet by mouth daily.   . Omega-3 Fatty Acids (FISH OIL) 1000 MG CAPS Take 1 capsule by mouth daily.  . [DISCONTINUED] AMBULATORY NON FORMULARY MEDICATION Medication Name: hand control for vehicle operation. G82.2  . [DISCONTINUED] AMBULATORY NON FORMULARY MEDICATION Medication Name: Tek RMD Dx: Paraplegia G82.80 Fax: [employer]: [phone]  . [DISCONTINUED] cetirizine (ZYRTEC) 10 MG tablet Take 1 tablet (10 mg total) by mouth 2 (two) times daily for 7 days.  . [DISCONTINUED] CRANBERRY CONCENTRATE PO Take by mouth.  . [DISCONTINUED] hydrocortisone 1 % ointment Apply to mouth angles twice daily for 1 week  . [DISCONTINUED] ranitidine (ZANTAC) 300 MG tablet Take 1 tablet (300 mg total) by mouth 2 (two) times daily for 7 days.   No facility-administered encounter medications on file as of 06/01/2018.          Objective:   Physical Exam  Constitutional: He is oriented to person, place, and time. He appears well-developed and well-nourished.  HENT:  Head: Normocephalic and atraumatic.  Eyes: Conjunctivae and EOM are normal.  Cardiovascular: Normal rate.  Pulmonary/Chest: Effort normal.  Neurological: He is alert and oriented to person, place, and time.  Skin: Skin is dry. No pallor.  Psychiatric: He has a normal mood and affect. His behavior is normal.  Vitals reviewed.      Assessment & Plan:  Odorous urine-urinalysis positive for just small leukocytes will send for culture and microscopic review if possible.  In the meantime we will check a CBC, CMP to check his renal function.  We will check a PSA.  His last one was normal in November of last year but is been almost 2 years we will recheck that today as well.  He actually has an appointment coming up with urology and believe next month so they can do some additional work-up to see if he may need to be evaluated or have a bladder scope done.  In the  meantime we can at least check some basic things  Due  for prostate cancer screening-PSA ordered.

## 2018-06-02 ENCOUNTER — Encounter: Payer: Self-pay | Admitting: Family Medicine

## 2018-06-02 LAB — COMPLETE METABOLIC PANEL WITH GFR
AG Ratio: 2.6 (calc) — ABNORMAL HIGH (ref 1.0–2.5)
ALT: 53 U/L — ABNORMAL HIGH (ref 9–46)
AST: 20 U/L (ref 10–35)
Albumin: 4.9 g/dL (ref 3.6–5.1)
Alkaline phosphatase (APISO): 69 U/L (ref 40–115)
BUN/Creatinine Ratio: 30 (calc) — ABNORMAL HIGH (ref 6–22)
BUN: 27 mg/dL — ABNORMAL HIGH (ref 7–25)
CO2: 26 mmol/L (ref 20–32)
Calcium: 10.1 mg/dL (ref 8.6–10.3)
Chloride: 103 mmol/L (ref 98–110)
Creat: 0.89 mg/dL (ref 0.70–1.33)
GFR, Est African American: 109 mL/min/{1.73_m2} (ref >=60)
GFR, Est Non African American: 94 mL/min/{1.73_m2} (ref >=60)
Globulin: 1.9 g/dL (calc) (ref 1.9–3.7)
Glucose, Bld: 141 mg/dL — ABNORMAL HIGH (ref 65–99)
Potassium: 4.6 mmol/L (ref 3.5–5.3)
Sodium: 139 mmol/L (ref 135–146)
Total Bilirubin: 0.4 mg/dL (ref 0.2–1.2)
Total Protein: 6.8 g/dL (ref 6.1–8.1)

## 2018-06-02 LAB — CBC WITH DIFFERENTIAL/PLATELET
Basophils Absolute: 39 cells/uL (ref 0–200)
Basophils Relative: 0.5 %
Eosinophils Absolute: 0 cells/uL — ABNORMAL LOW (ref 15–500)
Eosinophils Relative: 0 %
HCT: 45.4 % (ref 38.5–50.0)
Hemoglobin: 15.9 g/dL (ref 13.2–17.1)
Lymphs Abs: 593 cells/uL — ABNORMAL LOW (ref 850–3900)
MCH: 31.5 pg (ref 27.0–33.0)
MCHC: 35 g/dL (ref 32.0–36.0)
MCV: 90.1 fL (ref 80.0–100.0)
MPV: 10.2 fL (ref 7.5–12.5)
Monocytes Relative: 2.6 %
Neutro Abs: 6868 cells/uL (ref 1500–7800)
Neutrophils Relative %: 89.2 %
Platelets: 259 10*3/uL (ref 140–400)
RBC: 5.04 10*6/uL (ref 4.20–5.80)
RDW: 13 % (ref 11.0–15.0)
Total Lymphocyte: 7.7 %
WBC mixed population: 200 cells/uL (ref 200–950)
WBC: 7.7 10*3/uL (ref 3.8–10.8)

## 2018-06-02 LAB — LIPID PANEL
Cholesterol: 246 mg/dL — ABNORMAL HIGH (ref <200)
HDL: 63 mg/dL (ref >40)
LDL Cholesterol (Calc): 146 mg/dL (calc) — ABNORMAL HIGH
Non-HDL Cholesterol (Calc): 183 mg/dL (calc) — ABNORMAL HIGH (ref <130)
Total CHOL/HDL Ratio: 3.9 (calc) (ref <5.0)
Triglycerides: 220 mg/dL — ABNORMAL HIGH (ref <150)

## 2018-06-02 LAB — PSA: PSA: 0.4 ng/mL (ref <=4.0)

## 2018-06-03 ENCOUNTER — Other Ambulatory Visit: Payer: Self-pay | Admitting: *Deleted

## 2018-06-03 DIAGNOSIS — R7401 Elevation of levels of liver transaminase levels: Secondary | ICD-10-CM

## 2018-06-03 DIAGNOSIS — R740 Nonspecific elevation of levels of transaminase and lactic acid dehydrogenase [LDH]: Principal | ICD-10-CM

## 2018-06-03 LAB — URINE CULTURE
MICRO NUMBER:: 91109164
SPECIMEN QUALITY:: ADEQUATE

## 2018-06-03 LAB — URINALYSIS, MICROSCOPIC ONLY
Hyaline Cast: NONE SEEN /LPF
RBC / HPF: NONE SEEN /HPF (ref 0–2)

## 2018-06-03 MED ORDER — AMOXICILLIN-POT CLAVULANATE 875-125 MG PO TABS: 1.0000 | ORAL_TABLET | Freq: Two times a day (BID) | ORAL | 0 refills | Status: DC

## 2018-06-03 NOTE — Addendum Note (Signed)
Addended by: [name] D on: 06/03/2018 11:51 AM   Modules accepted: Orders

## 2018-06-05 ENCOUNTER — Encounter: Payer: Self-pay | Admitting: Family Medicine

## 2018-06-05 MED ORDER — AMOXICILLIN-POT CLAVULANATE 875-125 MG PO TABS: 1.0000 | ORAL_TABLET | Freq: Two times a day (BID) | ORAL | 0 refills | Status: DC

## 2018-06-19 ENCOUNTER — Encounter: Payer: Self-pay | Admitting: Family Medicine

## 2018-06-23 LAB — HEPATIC FUNCTION PANEL
AG Ratio: 2.1 (calc) (ref 1.0–2.5)
ALT: 39 U/L (ref 9–46)
AST: 25 U/L (ref 10–35)
Albumin: 4.2 g/dL (ref 3.6–5.1)
Alkaline phosphatase (APISO): 67 U/L (ref 40–115)
Bilirubin, Direct: 0.1 mg/dL (ref 0.0–0.2)
Globulin: 2 g/dL (calc) (ref 1.9–3.7)
Indirect Bilirubin: 0.3 mg/dL (calc) (ref 0.2–1.2)
Total Bilirubin: 0.4 mg/dL (ref 0.2–1.2)
Total Protein: 6.2 g/dL (ref 6.1–8.1)

## 2018-08-27 ENCOUNTER — Encounter: Payer: Self-pay | Admitting: Family Medicine

## 2018-08-27 ENCOUNTER — Other Ambulatory Visit: Payer: Self-pay | Admitting: Family Medicine

## 2018-08-27 ENCOUNTER — Ambulatory Visit (INDEPENDENT_AMBULATORY_CARE_PROVIDER_SITE_OTHER): Payer: 59 | Admitting: Family Medicine

## 2018-08-27 VITALS — BP 137/87 | HR 88

## 2018-08-27 DIAGNOSIS — R8290 Unspecified abnormal findings in urine: Secondary | ICD-10-CM | POA: Diagnosis not present

## 2018-08-27 DIAGNOSIS — M7702 Medial epicondylitis, left elbow: Secondary | ICD-10-CM

## 2018-08-27 DIAGNOSIS — Z0000 Encounter for general adult medical examination without abnormal findings: Secondary | ICD-10-CM

## 2018-08-27 LAB — POCT URINALYSIS DIPSTICK
Bilirubin, UA: NEGATIVE
Glucose, UA: NEGATIVE
Ketones, UA: NEGATIVE
Nitrite, UA: NEGATIVE
Protein, UA: NEGATIVE
Spec Grav, UA: 1.01 (ref 1.010–1.025)
Urobilinogen, UA: 0.2 E.U./dL
pH, UA: 7 (ref 5.0–8.0)

## 2018-08-27 NOTE — Progress Notes (Signed)
CPE - Established Patient Visit  Subjective:  Patient ID: Thomas Schroeder, male    DOB: Jan 26, 1961  Age: 57 y.o. MRN: [id number]  CC:  Chief Complaint  Patient presents with  . Annual Exam    HPI Thomas Schroeder presents for CPE today.   Does have a couple of concerns.  He still continues to have an odor to his urine.  He has been changing his permanent catheter weekly and that does seem to help but it does not completely get rid of it.  He said he never got sick or developed a fever from it as he has with UTIs in the past.  He also complains of left medial elbow pain that is been going on for couple of months.  His wife is a physical therapist so they have been trying to do something such as heat at night and  tape.  He denies any redness or swelling.  Notices that things like lifting up his legs or trying to transfer aggravates it.  He does work on a Teaching laboratory technician all day as well. Has been using heat on it at night.    Past Medical History:  Diagnosis Date  . Allergy   . Chronic indwelling Foley catheter   . Chronic kidney disease   . ED (erectile dysfunction)   . History of recurrent UTIs   . Night muscle spasms   . Paraplegia [facility])     Past Surgical History:  Procedure Laterality Date  . APPENDECTOMY    . FEMUR FRACTURE SURGERY    . kidney stone removal    . RT tibia fracture    . T8 T9 fusion     . TONSILLECTOMY      Family History  Problem Relation Age of Onset  . Hyperlipidemia Mother   . Diabetes Mother   . Rosacea Mother   . Hypertension Neg Hx   . Lung cancer Mother        smoker    Social History   Socioeconomic History  . Marital status: Soil scientist    Spouse name: Not on file  . Number of children: 2  . Years of education: Not on file  . Highest education level: Not on file  Occupational History    Employer: [employer]  Social Needs  . Financial resource strain: Not on file  . Food insecurity:    Worry: Not on file    Inability: Not on file  .  Transportation needs:    Medical: Not on file    Non-medical: Not on file  Tobacco Use  . Smoking status: Never Smoker  . Smokeless tobacco: Never Used  Substance and Sexual Activity  . Alcohol use: Yes    Alcohol/week: 10.0 standard drinks    Types: 10 Standard drinks or equivalent per week    Comment: per week  . Drug use: Not on file  . Sexual activity: Yes    Partners: Female  Lifestyle  . Physical activity:    Days per week: Not on file    Minutes per session: Not on file  . Stress: Not on file  Relationships  . Social connections:    Talks on phone: Not on file    Gets together: Not on file    Attends religious service: Not on file    Active member of club or organization: Not on file    Attends meetings of clubs or organizations: Not on file    Relationship status: Not  on file  . Intimate partner violence:    Fear of current or ex partner: Not on file    Emotionally abused: Not on file    Physically abused: Not on file    Forced sexual activity: Not on file  Other Topics Concern  . Not on file  Social History Narrative   1 caffeine drinks per day.  Some exercise.     Outpatient Medications Prior to Visit  Medication Sig Dispense Refill  . aspirin 325 MG tablet Take 325 mg by mouth daily.      . cholecalciferol (VITAMIN D) 1000 UNITS tablet Take 2,000 Units by mouth daily.     Marland Kitchen CRANBERRY EXTRACT PO Take 1 capsule by mouth daily.    . diazepam (VALIUM) 10 MG tablet Take 2 tablets (20 mg total) by mouth every 8 (eight) hours as needed. 540 tablet 1  . Multiple Vitamin (MULTI VITAMIN MENS PO) Take 1 tablet by mouth daily.     . Omega-3 Fatty Acids (FISH OIL) 1000 MG CAPS Take 1 capsule by mouth daily.    Marland Kitchen amoxicillin-clavulanate (AUGMENTIN) 875-125 MG tablet Take 1 tablet by mouth 2 (two) times daily. 14 tablet 0   No facility-administered medications prior to visit.     Allergies  Allergen Reactions  . Bactrim Rash    Skin sloughing   . Suprax [Cefixime]  Swelling  . Latex Rash    ROS Review of Systems    Objective:    Physical Exam  Constitutional: He is oriented to person, place, and time. He appears well-developed and well-nourished.  HENT:  Head: Normocephalic and atraumatic.  Right Ear: External ear normal.  Left Ear: External ear normal.  Nose: Nose normal.  Mouth/Throat: Oropharynx is clear and moist.  Eyes: Pupils are equal, round, and reactive to light. Conjunctivae and EOM are normal.  Neck: Normal range of motion. Neck supple. No thyromegaly present.  Cardiovascular: Normal rate, regular rhythm, normal heart sounds and intact distal pulses.  Pulmonary/Chest: Effort normal and breath sounds normal.  Abdominal: Soft. Bowel sounds are normal. He exhibits no distension and no mass. There is no abdominal tenderness. There is no rebound and no guarding.  Musculoskeletal: Normal range of motion.  Lymphadenopathy:    He has no cervical adenopathy.  Neurological: He is alert and oriented to person, place, and time. He has normal reflexes.  Skin: Skin is warm and dry.  Psychiatric: He has a normal mood and affect. His behavior is normal. Judgment and thought content normal.    BP 137/87   Pulse 88  Wt Readings from Last 3 Encounters:  06/16/15 180 lb (81.6 kg)  08/04/14 180 lb (81.6 kg)  03/16/13 170 lb (77.1 kg)     There are no preventive care reminders to display for this patient.  There are no preventive care reminders to display for this patient.  Lab Results  Component Value Date   TSH 1.899 03/28/2009   Lab Results  Component Value Date   WBC 7.7 06/01/2018   HGB 15.9 06/01/2018   HCT 45.4 06/01/2018   MCV 90.1 06/01/2018   PLT 259 06/01/2018   Lab Results  Component Value Date   NA 139 06/01/2018   K 4.6 06/01/2018   CO2 26 06/01/2018   GLUCOSE 141 (H) 06/01/2018   BUN 27 (H) 06/01/2018   CREATININE 0.89 06/01/2018   BILITOT 0.4 06/22/2018   ALKPHOS 55 06/17/2016   AST 25 06/22/2018   ALT 39  06/22/2018  PROT 6.2 06/22/2018   ALBUMIN 4.3 06/17/2016   CALCIUM 10.1 06/01/2018   Lab Results  Component Value Date   CHOL 246 (H) 06/01/2018   Lab Results  Component Value Date   HDL 63 06/01/2018   Lab Results  Component Value Date   LDLCALC 146 (H) 06/01/2018   Lab Results  Component Value Date   TRIG 220 (H) 06/01/2018   Lab Results  Component Value Date   CHOLHDL 3.9 06/01/2018   No results found for: HGBA1C     Assessment & Plan:   Problem List Items Addressed This Visit    None    Visit Diagnoses    Wellness examination    -  Primary   Relevant Orders   COMPLETE METABOLIC PANEL WITH GFR   Lipid panel   CBC   Hemoglobin A1c   Foul smelling urine       Relevant Orders   POCT Urinalysis Dipstick (Completed)   Urine Culture   Medial epicondylitis of elbow, left         Keep up a regular exercise program and make sure you are eating a healthy diet Try to eat 4 servings of dairy a day, or if you are lactose intolerant take a calcium with vitamin D daily.  Your vaccines are up to date.    Medial epicondylits -discussed diagnosis.  Given handout on stretches to do on his own at home.  He does have a tennis elbow strap.  He says he can wear it for little while but then it gets uncomfortable.  Recommend icing instead of using heat.  No orders of the defined types were placed in this encounter.   Follow-up: Return in about 6 months (around 02/26/2019).    [name], MD

## 2018-08-27 NOTE — Patient Instructions (Signed)

## 2018-08-28 LAB — CBC
HCT: 46.1 % (ref 38.5–50.0)
Hemoglobin: 16 g/dL (ref 13.2–17.1)
MCH: 31.9 pg (ref 27.0–33.0)
MCHC: 34.7 g/dL (ref 32.0–36.0)
MCV: 92 fL (ref 80.0–100.0)
MPV: 10.5 fL (ref 7.5–12.5)
Platelets: 212 10*3/uL (ref 140–400)
RBC: 5.01 10*6/uL (ref 4.20–5.80)
RDW: 11.9 % (ref 11.0–15.0)
WBC: 7 10*3/uL (ref 3.8–10.8)

## 2018-08-28 LAB — LIPID PANEL
Cholesterol: 220 mg/dL — ABNORMAL HIGH (ref <200)
HDL: 43 mg/dL (ref >40)
LDL Cholesterol (Calc): 148 mg/dL (calc) — ABNORMAL HIGH
Non-HDL Cholesterol (Calc): 177 mg/dL (calc) — ABNORMAL HIGH (ref <130)
Total CHOL/HDL Ratio: 5.1 (calc) — ABNORMAL HIGH (ref <5.0)
Triglycerides: 159 mg/dL — ABNORMAL HIGH (ref <150)

## 2018-08-28 LAB — COMPLETE METABOLIC PANEL WITH GFR
AG Ratio: 1.9 (calc) (ref 1.0–2.5)
ALT: 33 U/L (ref 9–46)
AST: 20 U/L (ref 10–35)
Albumin: 4.5 g/dL (ref 3.6–5.1)
Alkaline phosphatase (APISO): 71 U/L (ref 40–115)
BUN: 15 mg/dL (ref 7–25)
CO2: 28 mmol/L (ref 20–32)
Calcium: 9.6 mg/dL (ref 8.6–10.3)
Chloride: 102 mmol/L (ref 98–110)
Creat: 0.91 mg/dL (ref 0.70–1.33)
GFR, Est African American: 107 mL/min/{1.73_m2} (ref >=60)
GFR, Est Non African American: 93 mL/min/{1.73_m2} (ref >=60)
Globulin: 2.4 g/dL (calc) (ref 1.9–3.7)
Glucose, Bld: 103 mg/dL — ABNORMAL HIGH (ref 65–99)
Potassium: 4.4 mmol/L (ref 3.5–5.3)
Sodium: 140 mmol/L (ref 135–146)
Total Bilirubin: 0.6 mg/dL (ref 0.2–1.2)
Total Protein: 6.9 g/dL (ref 6.1–8.1)

## 2018-08-28 LAB — HEMOGLOBIN A1C
Hgb A1c MFr Bld: 5.2 % of total Hgb (ref <5.7)
Mean Plasma Glucose: 103 (calc)
eAG (mmol/L): 5.7 (calc)

## 2018-08-28 MED ORDER — DIAZEPAM 10 MG PO TABS: 20.0000 mg | ORAL_TABLET | Freq: Three times a day (TID) | ORAL | 0 refills | Status: DC | PRN

## 2018-08-28 MED ORDER — DIAZEPAM 10 MG PO TABS: 20.0000 mg | ORAL_TABLET | Freq: Three times a day (TID) | ORAL | 1 refills | Status: DC | PRN

## 2018-08-30 LAB — URINE CULTURE
MICRO NUMBER:: 91491062
SPECIMEN QUALITY:: ADEQUATE

## 2018-09-02 ENCOUNTER — Encounter: Payer: Self-pay | Admitting: Family Medicine

## 2018-09-03 MED ORDER — CIPROFLOXACIN HCL 500 MG PO TABS: 500.0000 mg | ORAL_TABLET | Freq: Two times a day (BID) | ORAL | 0 refills | Status: DC

## 2018-09-10 MED ORDER — CIPROFLOXACIN HCL 500 MG PO TABS: 500.0000 mg | ORAL_TABLET | Freq: Two times a day (BID) | ORAL | 0 refills | Status: DC

## 2018-09-10 NOTE — Addendum Note (Signed)
Addended by: Silverio Decamp on: 09/10/2018 01:42 PM   Modules accepted: Orders

## 2018-09-24 ENCOUNTER — Telehealth: Payer: Self-pay | Admitting: *Deleted

## 2018-09-24 ENCOUNTER — Encounter: Payer: Self-pay | Admitting: Family Medicine

## 2018-09-24 NOTE — Telephone Encounter (Signed)
Form completed,faxed,confirmation received and scanned into patient's chart..Mathieu Schloemer Lynetta, CMA  

## 2018-12-06 ENCOUNTER — Encounter: Payer: Self-pay | Admitting: Family Medicine

## 2018-12-24 DIAGNOSIS — F4321 Adjustment disorder with depressed mood: Secondary | ICD-10-CM | POA: Diagnosis not present

## 2019-01-18 ENCOUNTER — Encounter: Payer: Self-pay | Admitting: Family Medicine

## 2019-01-21 MED ORDER — DIAZEPAM 10 MG PO TABS: 20.0000 mg | ORAL_TABLET | Freq: Three times a day (TID) | ORAL | 1 refills | Status: DC | PRN

## 2019-01-21 NOTE — Addendum Note (Signed)
Addended by: Alena Bills R on: 01/21/2019 04:07 PM   Modules accepted: Orders

## 2019-01-26 ENCOUNTER — Encounter: Payer: Self-pay | Admitting: Family Medicine

## 2019-01-27 MED ORDER — DIAZEPAM 10 MG PO TABS: 20.0000 mg | ORAL_TABLET | Freq: Three times a day (TID) | ORAL | 1 refills | Status: DC | PRN

## 2019-01-27 NOTE — Telephone Encounter (Signed)
I am sorry about the difficulty in getting you your medication.We did actually fax it to [facility] on May 7.Particularly with schedule drugs sometimes they will automatically print and we have to manually change it to electronic.If we forget to do that and it prints,then we usually just go ahead and fax it since we have already used the paper to print it at that point.So it was faxed... so I am not sure why you have not received it.I am going to resend it today electronically to [facility].I will make sure it is not a fax.Check with them this afternoon and make sure that they received it.

## 2019-02-07 ENCOUNTER — Encounter: Payer: Self-pay | Admitting: Family Medicine

## 2019-02-09 MED ORDER — DOXYCYCLINE HYCLATE 100 MG PO TABS: 100.0000 mg | ORAL_TABLET | Freq: Two times a day (BID) | ORAL | 0 refills | Status: DC

## 2019-03-09 ENCOUNTER — Encounter: Payer: Self-pay | Admitting: Family Medicine

## 2019-03-09 ENCOUNTER — Telehealth (INDEPENDENT_AMBULATORY_CARE_PROVIDER_SITE_OTHER): Payer: BC Managed Care – PPO | Admitting: Family Medicine

## 2019-03-09 ENCOUNTER — Other Ambulatory Visit: Payer: Self-pay

## 2019-03-09 ENCOUNTER — Ambulatory Visit (INDEPENDENT_AMBULATORY_CARE_PROVIDER_SITE_OTHER): Payer: BC Managed Care – PPO

## 2019-03-09 VITALS — BP 124/80 | HR 68 | Temp 96.6°F

## 2019-03-09 DIAGNOSIS — N200 Calculus of kidney: Secondary | ICD-10-CM | POA: Diagnosis not present

## 2019-03-09 DIAGNOSIS — R197 Diarrhea, unspecified: Secondary | ICD-10-CM | POA: Diagnosis not present

## 2019-03-09 DIAGNOSIS — R748 Abnormal levels of other serum enzymes: Secondary | ICD-10-CM

## 2019-03-09 DIAGNOSIS — G8220 Paraplegia, unspecified: Secondary | ICD-10-CM | POA: Diagnosis not present

## 2019-03-09 NOTE — Progress Notes (Signed)
x2 days. 1 episode per day. He hasn't taken anything. He is able to eat pretty well. He stated that his abdomen is spasms   He feels fatigued, unable to lie down on his side.  Denies any f/s/c/n(due to his condition he is uncertain about feeling nauseated)/v/headaches,or body aches.  He is only been doing a liquid diet today.  Sunday he stated that his BM looked like a "baby poop"  saturday it was watery  Now it has a soft form.  He is scheduled to travel to [place] on Friday.  Maryruth Eve, Lahoma Crocker, CMA

## 2019-03-09 NOTE — Progress Notes (Signed)
Virtual Visit via Video Note  I connected with Thomas Schroeder on 03/09/19 at 10:30 AM EDT by a video enabled telemedicine application and verified that I am speaking with the correct person using two identifiers.   I discussed the limitations of evaluation and management by telemedicine and the availability of in person appointments. The patient expressed understanding and agreed to proceed.  Pt was at home and I was in my office for the virtual visit.     Subjective:    CC: diarrhea   HPI: Diarrhea started on Saturday with  1 episode per day. He hasn't taken anything. He is able to eat pretty well. He stated that his abdomen is spasms.  He feels fatigued, unable to lie down on his side.  Denies any f/s/c/n(due to his condition he is uncertain about feeling nauseated)/v/headaches,or body aches.  He is only been doing a liquid diet today.  Sunday he stated that his BM looked like a "baby poop". Color brownish/gray. Yesterday it was watery. Now it has a soft form. Has had appendix removed. Last BM  Was around 2PM yesterday. No changes.   He has been working from home and has only been out of the house 4 times since Feb.   No nausea or vomiting. Sunday had some weird spasms in her lower abdomen. Belly doesn't feel distended.   He did a lot of yard work this weekend.  He was on a mower and working on it.  He was liftnig himself up.  He says he does sometimes get an abnormal discomfort or sensation in his low back and if he takes his fists and presses against his back he will actually get a little bit of relief.  Even though he cannot feel his hand against his skin.  He is scheduled to travel to [place] on Friday.   Past medical history, Surgical history, Family history not pertinant except as noted below, Social history, Allergies, and medications have been entered into the medical record, reviewed, and corrections made.   Review of Systems: No fevers, chills, night sweats, weight loss, chest pain,  or shortness of breath.   Objective:    General: Speaking clearly in complete sentences without any shortness of breath.  Alert and oriented x3.  Normal judgment. No apparent acute distress.    Impression and Recommendations:   Diarrhea -unclear etiology.  No known triggers.  I think COVID is less likely as he is not having any other symptoms and the diarrhea does not seem to be profuse.  No nausea or vomiting with it and no fevers or sweats or chills.  Recommend further work-up with just a KUB at least initially, CBC to see if the white count is elevated as well as liver enzymes and pancreatic enzymes.  Also recommend stool culture for further work-up.  Without pain to really guide [place] I do want to be cautious about our work-up and thoughtful since he has paraplegia.     I discussed the assessment and treatment plan with the patient. The patient was provided an opportunity to ask questions and all were answered. The patient agreed with the plan and demonstrated an understanding of the instructions.   The patient was advised to call back or seek an in-person evaluation if the symptoms worsen or if the condition fails to improve as anticipated.   [name], MD

## 2019-03-10 LAB — CBC WITH DIFFERENTIAL/PLATELET
Absolute Monocytes: 559 cells/uL (ref 200–950)
Basophils Absolute: 28 cells/uL (ref 0–200)
Basophils Relative: 0.6 %
Eosinophils Absolute: 52 cells/uL (ref 15–500)
Eosinophils Relative: 1.1 %
HCT: 49 % (ref 38.5–50.0)
Hemoglobin: 17 g/dL (ref 13.2–17.1)
Lymphs Abs: 1025 cells/uL (ref 850–3900)
MCH: 32 pg (ref 27.0–33.0)
MCHC: 34.7 g/dL (ref 32.0–36.0)
MCV: 92.3 fL (ref 80.0–100.0)
MPV: 10.1 fL (ref 7.5–12.5)
Monocytes Relative: 11.9 %
Neutro Abs: 3036 cells/uL (ref 1500–7800)
Neutrophils Relative %: 64.6 %
Platelets: 210 10*3/uL (ref 140–400)
RBC: 5.31 10*6/uL (ref 4.20–5.80)
RDW: 12.1 % (ref 11.0–15.0)
Total Lymphocyte: 21.8 %
WBC: 4.7 10*3/uL (ref 3.8–10.8)

## 2019-03-10 LAB — LIPASE: Lipase: 186 U/L — ABNORMAL HIGH (ref 7–60)

## 2019-03-10 LAB — COMPLETE METABOLIC PANEL WITH GFR
AG Ratio: 2.1 (calc) (ref 1.0–2.5)
ALT: 81 U/L — ABNORMAL HIGH (ref 9–46)
AST: 37 U/L — ABNORMAL HIGH (ref 10–35)
Albumin: 4.8 g/dL (ref 3.6–5.1)
Alkaline phosphatase (APISO): 59 U/L (ref 35–144)
BUN: 12 mg/dL (ref 7–25)
CO2: 25 mmol/L (ref 20–32)
Calcium: 9.9 mg/dL (ref 8.6–10.3)
Chloride: 103 mmol/L (ref 98–110)
Creat: 0.78 mg/dL (ref 0.70–1.33)
GFR, Est African American: 115 mL/min/{1.73_m2} (ref >=60)
GFR, Est Non African American: 99 mL/min/{1.73_m2} (ref >=60)
Globulin: 2.3 g/dL (calc) (ref 1.9–3.7)
Glucose, Bld: 89 mg/dL (ref 65–99)
Potassium: 4.7 mmol/L (ref 3.5–5.3)
Sodium: 139 mmol/L (ref 135–146)
Total Bilirubin: 0.7 mg/dL (ref 0.2–1.2)
Total Protein: 7.1 g/dL (ref 6.1–8.1)

## 2019-03-11 DIAGNOSIS — R748 Abnormal levels of other serum enzymes: Secondary | ICD-10-CM | POA: Diagnosis not present

## 2019-03-12 ENCOUNTER — Encounter: Payer: Self-pay | Admitting: Family Medicine

## 2019-03-12 LAB — LIPASE: Lipase: 49 U/L (ref 7–60)

## 2019-06-03 DIAGNOSIS — Z20828 Contact with and (suspected) exposure to other viral communicable diseases: Secondary | ICD-10-CM | POA: Diagnosis not present

## 2019-06-17 DIAGNOSIS — Z23 Encounter for immunization: Secondary | ICD-10-CM | POA: Diagnosis not present

## 2019-06-23 ENCOUNTER — Encounter: Payer: Self-pay | Admitting: Family Medicine

## 2019-06-24 NOTE — Telephone Encounter (Signed)
Completed.

## 2019-07-13 DIAGNOSIS — F4321 Adjustment disorder with depressed mood: Secondary | ICD-10-CM | POA: Diagnosis not present

## 2019-07-19 ENCOUNTER — Encounter: Payer: Self-pay | Admitting: Family Medicine

## 2019-07-19 MED ORDER — AMBULATORY NON FORMULARY MEDICATION: 0 refills | Status: DC

## 2019-07-19 NOTE — Telephone Encounter (Signed)
[facility] for order for wound care as note above.

## 2019-07-28 DIAGNOSIS — F4321 Adjustment disorder with depressed mood: Secondary | ICD-10-CM | POA: Diagnosis not present

## 2019-07-29 ENCOUNTER — Telehealth: Payer: Self-pay

## 2019-07-29 DIAGNOSIS — Z5189 Encounter for other specified aftercare: Secondary | ICD-10-CM

## 2019-07-29 NOTE — Telephone Encounter (Signed)
Wound care ordered were entered as script and not referral, therefore [facility] states they did not receive it.   I have entered urgent referral for wound care  FYI to Dr Madilyn Fireman I entered referral urgently for pt to be evaluated tomorrow

## 2019-07-30 ENCOUNTER — Telehealth: Payer: Self-pay

## 2019-07-30 NOTE — Telephone Encounter (Signed)
OK 

## 2019-07-30 NOTE — Telephone Encounter (Signed)
Melissa with [facility]. They called to schedule an appointment and he was unable to do Saturday. He has been scheduled for Sunday. Per Madilyn Fireman it is ok to proceed with Sunday visit. [facility] advised.

## 2019-08-01 DIAGNOSIS — Z466 Encounter for fitting and adjustment of urinary device: Secondary | ICD-10-CM | POA: Diagnosis not present

## 2019-08-01 DIAGNOSIS — G8220 Paraplegia, unspecified: Secondary | ICD-10-CM | POA: Diagnosis not present

## 2019-08-01 DIAGNOSIS — E785 Hyperlipidemia, unspecified: Secondary | ICD-10-CM | POA: Diagnosis not present

## 2019-08-01 DIAGNOSIS — M8580 Other specified disorders of bone density and structure, unspecified site: Secondary | ICD-10-CM | POA: Diagnosis not present

## 2019-08-01 DIAGNOSIS — Z87440 Personal history of urinary (tract) infections: Secondary | ICD-10-CM | POA: Diagnosis not present

## 2019-08-01 DIAGNOSIS — K3189 Other diseases of stomach and duodenum: Secondary | ICD-10-CM | POA: Diagnosis not present

## 2019-08-01 DIAGNOSIS — L89153 Pressure ulcer of sacral region, stage 3: Secondary | ICD-10-CM | POA: Diagnosis not present

## 2019-08-02 NOTE — Telephone Encounter (Addendum)
Received a message form [facility] that home care is needing verbal orders for Otpifil patches for a stage 3 wound that is on his bottom. Please advise.   [phone] is number.

## 2019-08-03 NOTE — Telephone Encounter (Signed)
I called and spoke with Melissa at [facility] and she took the verbal order per PCP. No other questions.

## 2019-08-03 NOTE — Telephone Encounter (Signed)
Called number provided, no answer and not an ID'd VM. Left msg for call back

## 2019-08-03 NOTE — Telephone Encounter (Signed)
OK for verbal order 

## 2019-08-06 DIAGNOSIS — M8580 Other specified disorders of bone density and structure, unspecified site: Secondary | ICD-10-CM | POA: Diagnosis not present

## 2019-08-06 DIAGNOSIS — Z466 Encounter for fitting and adjustment of urinary device: Secondary | ICD-10-CM | POA: Diagnosis not present

## 2019-08-06 DIAGNOSIS — L89153 Pressure ulcer of sacral region, stage 3: Secondary | ICD-10-CM | POA: Diagnosis not present

## 2019-08-06 DIAGNOSIS — K3189 Other diseases of stomach and duodenum: Secondary | ICD-10-CM | POA: Diagnosis not present

## 2019-08-06 DIAGNOSIS — Z87440 Personal history of urinary (tract) infections: Secondary | ICD-10-CM | POA: Diagnosis not present

## 2019-08-06 DIAGNOSIS — G8220 Paraplegia, unspecified: Secondary | ICD-10-CM | POA: Diagnosis not present

## 2019-08-06 DIAGNOSIS — E785 Hyperlipidemia, unspecified: Secondary | ICD-10-CM | POA: Diagnosis not present

## 2019-08-24 DIAGNOSIS — G8220 Paraplegia, unspecified: Secondary | ICD-10-CM | POA: Diagnosis not present

## 2019-08-25 DIAGNOSIS — F4321 Adjustment disorder with depressed mood: Secondary | ICD-10-CM | POA: Diagnosis not present

## 2019-09-21 DIAGNOSIS — F4321 Adjustment disorder with depressed mood: Secondary | ICD-10-CM | POA: Diagnosis not present

## 2019-11-10 ENCOUNTER — Encounter: Payer: Self-pay | Admitting: Family Medicine

## 2019-11-10 MED ORDER — DIAZEPAM 10 MG PO TABS: 20.0000 mg | ORAL_TABLET | Freq: Three times a day (TID) | ORAL | 1 refills | Status: DC | PRN

## 2019-11-10 NOTE — Telephone Encounter (Signed)
Pt sent a MyChart msg - requesting med refill for Diazepam. Last virtual visit was 03/09/19. Last med refill was 01/27/19. Pt has an upcoming appt on 11/25/19 for annual.

## 2019-11-18 DIAGNOSIS — M9903 Segmental and somatic dysfunction of lumbar region: Secondary | ICD-10-CM | POA: Diagnosis not present

## 2019-11-18 DIAGNOSIS — M5412 Radiculopathy, cervical region: Secondary | ICD-10-CM | POA: Diagnosis not present

## 2019-11-18 DIAGNOSIS — M7912 Myalgia of auxiliary muscles, head and neck: Secondary | ICD-10-CM | POA: Diagnosis not present

## 2019-11-18 DIAGNOSIS — M9901 Segmental and somatic dysfunction of cervical region: Secondary | ICD-10-CM | POA: Diagnosis not present

## 2019-11-22 DIAGNOSIS — M9901 Segmental and somatic dysfunction of cervical region: Secondary | ICD-10-CM | POA: Diagnosis not present

## 2019-11-22 DIAGNOSIS — M9903 Segmental and somatic dysfunction of lumbar region: Secondary | ICD-10-CM | POA: Diagnosis not present

## 2019-11-22 DIAGNOSIS — M5412 Radiculopathy, cervical region: Secondary | ICD-10-CM | POA: Diagnosis not present

## 2019-11-22 DIAGNOSIS — M7912 Myalgia of auxiliary muscles, head and neck: Secondary | ICD-10-CM | POA: Diagnosis not present

## 2019-11-25 ENCOUNTER — Encounter: Payer: Self-pay | Admitting: Family Medicine

## 2019-11-25 ENCOUNTER — Ambulatory Visit (INDEPENDENT_AMBULATORY_CARE_PROVIDER_SITE_OTHER): Payer: BC Managed Care – PPO | Admitting: Family Medicine

## 2019-11-25 ENCOUNTER — Other Ambulatory Visit: Payer: Self-pay

## 2019-11-25 VITALS — BP 133/79 | HR 88

## 2019-11-25 DIAGNOSIS — M549 Dorsalgia, unspecified: Secondary | ICD-10-CM

## 2019-11-25 DIAGNOSIS — Z0000 Encounter for general adult medical examination without abnormal findings: Secondary | ICD-10-CM

## 2019-11-25 DIAGNOSIS — R259 Unspecified abnormal involuntary movements: Secondary | ICD-10-CM

## 2019-11-25 DIAGNOSIS — M542 Cervicalgia: Secondary | ICD-10-CM | POA: Diagnosis not present

## 2019-11-25 DIAGNOSIS — R1013 Epigastric pain: Secondary | ICD-10-CM | POA: Diagnosis not present

## 2019-11-25 DIAGNOSIS — G259 Extrapyramidal and movement disorder, unspecified: Secondary | ICD-10-CM | POA: Diagnosis not present

## 2019-11-25 MED ORDER — ROPINIROLE HCL 0.25 MG PO TABS: 0.2500 mg | ORAL_TABLET | Freq: Every day | ORAL | 1 refills | Status: DC

## 2019-11-25 NOTE — Patient Instructions (Signed)

## 2019-11-25 NOTE — Progress Notes (Signed)
CPE  Established Patient Office Visit  Subjective:  Patient ID: Thomas Schroeder, male    DOB: September 07, 1961  Age: 59 y.o. MRN: [id number]  CC:  Chief Complaint  Patient presents with  . Annual Exam    HPI Thomas Schroeder presents for CPE.  He tries to stay active.  He is wheelchair-bound because of paraplegia and so does not exercise regularly.  More recently he has been struggling with back and neck pain for several weeks..  It started after he bent forward in his electric wheelchair to pick something up off the ground.  In fact to the most locked immediately he was finally able to start to move it started having significant pain in his mid back area and in his neck.  Since then he has been using a heating pad pretty aggressively and also recently started seeing a chiropractor.  He has been twice so far.  He is starting to feel little bit better over the last 2 days.  He was able to sleep without having to use his heating pad last night.  The chiropractor has recommended a therapy called interferential therapy that he is looked into but it is quite expensive the device is in the $600.  He has had chiropractic care in the past and has found it helpful.  He is also scheduled with a massage therapist to hopefully work on his hip flexors as he has had problems with those as well sometimes at night he will get a spasming where he will's leg will kick out and then slowly draw back in.  He also had an episode yesterday of epigastric pain that radiated across the upper abdomen bilaterally.  He said it happened right before he ate and lasted for several minutes.  It was intense enough that it almost doubled him over.  He did not have any vomiting or diarrhea with it in fact has not had any major change in bowel movements.  He also reports a lot of limb movements in the middle of the night while he is asleep.  His wife actually said that he was dreaming the head at one point a week or so ago.  He is not aware that  he is doing it.   Past Medical History:  Diagnosis Date  . Allergy   . Chronic indwelling Foley catheter   . Chronic kidney disease   . ED (erectile dysfunction)   . History of recurrent UTIs   . Night muscle spasms   . Paraplegia [facility])     Past Surgical History:  Procedure Laterality Date  . APPENDECTOMY    . FEMUR FRACTURE SURGERY    . kidney stone removal    . RT tibia fracture    . T8 T9 fusion     . TONSILLECTOMY      Family History  Problem Relation Age of Onset  . Hyperlipidemia Mother   . Diabetes Mother   . Rosacea Mother   . Hypertension Neg Hx   . Lung cancer Mother        smoker    Social History   Socioeconomic History  . Marital status: Soil scientist    Spouse name: Not on file  . Number of children: 2  . Years of education: Not on file  . Highest education level: Not on file  Occupational History    Employer: [employer]  Tobacco Use  . Smoking status: Never Smoker  . Smokeless tobacco: Never Used  Substance and  Sexual Activity  . Alcohol use: Yes    Alcohol/week: 10.0 standard drinks    Types: 10 Standard drinks or equivalent per week    Comment: per week  . Drug use: Not on file  . Sexual activity: Yes    Partners: Female  Other Topics Concern  . Not on file  Social History Narrative   1 caffeine drinks per day.  Some exercise.    Social Determinants of Health   Financial Resource Strain:   . Difficulty of Paying Living Expenses:   Food Insecurity:   . Worried About Charity fundraiser in the Last Year:   . Arboriculturist in the Last Year:   Transportation Needs:   . Film/video editor (Medical):   Marland Kitchen Lack of Transportation (Non-Medical):   Physical Activity:   . Days of Exercise per Week:   . Minutes of Exercise per Session:   Stress:   . Feeling of Stress :   Social Connections:   . Frequency of Communication with Friends and Family:   . Frequency of Social Gatherings with Friends and Family:   . Attends  Religious Services:   . Active Member of Clubs or Organizations:   . Attends Archivist Meetings:   Marland Kitchen Marital Status:   Intimate Partner Violence:   . Fear of Current or Ex-Partner:   . Emotionally Abused:   Marland Kitchen Physically Abused:   . Sexually Abused:     Outpatient Medications Prior to Visit  Medication Sig Dispense Refill  . ascorbic acid (VITAMIN C) 1000 MG tablet Take by mouth.    Marland Kitchen aspirin 325 MG tablet Take 325 mg by mouth daily.      Marland Kitchen CRANBERRY EXTRACT PO Take 1 capsule by mouth daily.    . diazepam (VALIUM) 10 MG tablet Take 2 tablets (20 mg total) by mouth every 8 (eight) hours as needed. 540 tablet 1  . Multiple Vitamin (MULTI VITAMIN MENS PO) Take 1 tablet by mouth daily.     . Omega-3 Fatty Acids (FISH OIL) 1000 MG CAPS Take 1 capsule by mouth daily.    . AMBULATORY NON FORMULARY MEDICATION Medication Name: wound care with [facility]. 1 each 0   No facility-administered medications prior to visit.    Allergies  Allergen Reactions  . Bactrim Rash    Skin sloughing   . Suprax [Cefixime] Swelling  . Sulfamethoxazole-Trimethoprim     Other reaction(s): Other (See Comments) Skin sloughing  . Latex Rash    ROS Review of Systems    Objective:    Physical Exam  Constitutional: He is oriented to person, place, and time. He appears well-developed and well-nourished.  Sitting in a wheelchair.  HENT:  Head: Normocephalic and atraumatic.  Right Ear: External ear normal.  Left Ear: External ear normal.  Nose: Nose normal.  Mouth/Throat: Oropharynx is clear and moist.  Eyes: Pupils are equal, round, and reactive to light. Conjunctivae and EOM are normal.  Neck: No thyromegaly present.  Cardiovascular: Normal rate, regular rhythm, normal heart sounds and intact distal pulses.  Pulmonary/Chest: Effort normal and breath sounds normal.  Abdominal: Soft. Bowel sounds are normal. He exhibits no distension and no mass. There is no abdominal  tenderness. There is no rebound and no guarding.  Musculoskeletal:        General: No edema. Normal range of motion.     Cervical back: Normal range of motion and neck supple.  Lymphadenopathy:    He  has no cervical adenopathy.  Neurological: He is alert and oriented to person, place, and time.  Skin: Skin is warm and dry.  Psychiatric: He has a normal mood and affect. His behavior is normal. Judgment and thought content normal.    BP 133/79   Pulse 88   SpO2 100%  Wt Readings from Last 3 Encounters:  06/16/15 180 lb (81.6 kg)  08/04/14 180 lb (81.6 kg)  03/16/13 170 lb (77.1 kg)     There are no preventive care reminders to display for this patient.  There are no preventive care reminders to display for this patient.  Lab Results  Component Value Date   TSH 1.899 03/28/2009   Lab Results  Component Value Date   WBC 6.2 11/25/2019   HGB 15.4 11/25/2019   HCT 45.5 11/25/2019   MCV 92.5 11/25/2019   PLT 267 11/25/2019   Lab Results  Component Value Date   NA 139 03/09/2019   K 4.7 03/09/2019   CO2 25 03/09/2019   GLUCOSE 89 03/09/2019   BUN 12 03/09/2019   CREATININE 0.78 03/09/2019   BILITOT 0.7 03/09/2019   ALKPHOS 55 06/17/2016   AST 37 (H) 03/09/2019   ALT 81 (H) 03/09/2019   PROT 7.1 03/09/2019   ALBUMIN 4.3 06/17/2016   CALCIUM 9.9 03/09/2019   Lab Results  Component Value Date   CHOL 220 (H) 08/27/2018   Lab Results  Component Value Date   HDL 43 08/27/2018   Lab Results  Component Value Date   LDLCALC 148 (H) 08/27/2018   Lab Results  Component Value Date   TRIG 159 (H) 08/27/2018   Lab Results  Component Value Date   CHOLHDL 5.1 (H) 08/27/2018   Lab Results  Component Value Date   HGBA1C 5.2 08/27/2018      Assessment & Plan:   Problem List Items Addressed This Visit    None    Visit Diagnoses    Routine general medical examination at a health care facility    -  Primary   Relevant Orders   Lipid panel   COMPLETE METABOLIC  PANEL WITH GFR   PSA   CBC (Completed)   Hemoglobin A1c   Mid back pain       Cervical pain       Epigastric pain       Abnormal movement         Will get updated labs.  Do encourage increased activity level. Tetanus and pneumonia vaccines up-to-date.  Colonoscopy also up-to-date.  Abnormal movement-his description sounds very consistent with periodic limb movement.  I think he would benefit from a trial of medication to see if it is helpful or not.  His bed partner will probably notice improvement more than he will.  But we could certainly give it a try and see what he thinks.  He has been really ramping up his diazepam use to increase sedation so that he is having less movement.  He was actually taking 3 of the diazepam all at one time which is 30 mg.  Explained that that could actually be dangerous that is much too high of a dose and can actually start to cause respiratory depression which can cause other problems.  Patient agreed and understand to use less medication.  We can also check for iron deficiency and see if the ferritin is less than 45.  Mid back pain/cervical pain-currently under the care of a chiropractor and he has made some progress over  the last couple of days.  I will see if I can find a little bit more information about the interferential therapy I am just not familiar with it and it does seem quite expensive.  Possible periodic limb movement-discussed options including medications for treatment.  Epigastric pain-unclear etiology.  It does not sound like gallbladder as it happened before he ate it was very short-lived.  So could consider gas pain.  If it occurs again please let us know especially there is any concomitant vomiting or diarrhea.  Meds ordered this encounter  Medications  . rOPINIRole (REQUIP) 0.25 MG tablet    Sig: Take 1 tablet (0.25 mg total) by mouth at bedtime. About 1-3 hours before bedtime for Leg Movement    Dispense:  30 tablet    Refill:  1     Follow-up: Return in about 6 months (around 05/27/2020) for Medication .    [name], MD

## 2019-11-26 ENCOUNTER — Encounter: Payer: Self-pay | Admitting: Family Medicine

## 2019-11-26 LAB — LIPID PANEL
Cholesterol: 201 mg/dL — ABNORMAL HIGH (ref <200)
HDL: 35 mg/dL — ABNORMAL LOW (ref >=40)
LDL Cholesterol (Calc): 135 mg/dL (calc) — ABNORMAL HIGH
Non-HDL Cholesterol (Calc): 166 mg/dL (calc) — ABNORMAL HIGH (ref <130)
Total CHOL/HDL Ratio: 5.7 (calc) — ABNORMAL HIGH (ref <5.0)
Triglycerides: 178 mg/dL — ABNORMAL HIGH (ref <150)

## 2019-11-26 LAB — COMPLETE METABOLIC PANEL WITH GFR
AG Ratio: 1.9 (calc) (ref 1.0–2.5)
ALT: 47 U/L — ABNORMAL HIGH (ref 9–46)
AST: 22 U/L (ref 10–35)
Albumin: 4.4 g/dL (ref 3.6–5.1)
Alkaline phosphatase (APISO): 72 U/L (ref 35–144)
BUN: 19 mg/dL (ref 7–25)
CO2: 29 mmol/L (ref 20–32)
Calcium: 9.9 mg/dL (ref 8.6–10.3)
Chloride: 103 mmol/L (ref 98–110)
Creat: 0.86 mg/dL (ref 0.70–1.33)
GFR, Est African American: 110 mL/min/{1.73_m2} (ref >=60)
GFR, Est Non African American: 95 mL/min/{1.73_m2} (ref >=60)
Globulin: 2.3 g/dL (calc) (ref 1.9–3.7)
Glucose, Bld: 108 mg/dL — ABNORMAL HIGH (ref 65–99)
Potassium: 5.4 mmol/L — ABNORMAL HIGH (ref 3.5–5.3)
Sodium: 139 mmol/L (ref 135–146)
Total Bilirubin: 0.4 mg/dL (ref 0.2–1.2)
Total Protein: 6.7 g/dL (ref 6.1–8.1)

## 2019-11-26 LAB — CBC
HCT: 45.5 % (ref 38.5–50.0)
Hemoglobin: 15.4 g/dL (ref 13.2–17.1)
MCH: 31.3 pg (ref 27.0–33.0)
MCHC: 33.8 g/dL (ref 32.0–36.0)
MCV: 92.5 fL (ref 80.0–100.0)
MPV: 9.7 fL (ref 7.5–12.5)
Platelets: 267 10*3/uL (ref 140–400)
RBC: 4.92 10*6/uL (ref 4.20–5.80)
RDW: 12.3 % (ref 11.0–15.0)
WBC: 6.2 10*3/uL (ref 3.8–10.8)

## 2019-11-26 LAB — HEMOGLOBIN A1C
Hgb A1c MFr Bld: 5.5 % of total Hgb (ref <5.7)
Mean Plasma Glucose: 111 (calc)
eAG (mmol/L): 6.2 (calc)

## 2019-11-26 LAB — PSA: PSA: 1.1 ng/mL (ref <=4.0)

## 2019-11-29 ENCOUNTER — Encounter: Payer: Self-pay | Admitting: Family Medicine

## 2019-11-29 ENCOUNTER — Other Ambulatory Visit: Payer: Self-pay | Admitting: Family Medicine

## 2019-11-29 DIAGNOSIS — E875 Hyperkalemia: Secondary | ICD-10-CM

## 2019-11-29 MED ORDER — ATORVASTATIN CALCIUM 20 MG PO TABS: 20.0000 mg | ORAL_TABLET | Freq: Every day | ORAL | 3 refills | Status: DC

## 2019-11-29 MED ORDER — ATORVASTATIN CALCIUM 20 MG PO TABS: 20.0000 mg | ORAL_TABLET | Freq: Every day | ORAL | 0 refills | Status: DC

## 2019-11-29 NOTE — Progress Notes (Signed)
New prescription sent for atorvastatin to local and mail order pharmacy.  This will be a new start.  BMP ordered to follow-up for hyperkalemia.

## 2019-12-07 ENCOUNTER — Other Ambulatory Visit: Payer: Self-pay | Admitting: Family Medicine

## 2019-12-07 ENCOUNTER — Encounter: Payer: Self-pay | Admitting: Family Medicine

## 2019-12-09 ENCOUNTER — Encounter: Payer: Self-pay | Admitting: Family Medicine

## 2019-12-17 ENCOUNTER — Other Ambulatory Visit: Payer: Self-pay | Admitting: Family Medicine

## 2019-12-23 DIAGNOSIS — E875 Hyperkalemia: Secondary | ICD-10-CM | POA: Diagnosis not present

## 2019-12-23 LAB — BASIC METABOLIC PANEL WITH GFR
BUN: 13 mg/dL (ref 7–25)
CO2: 28 mmol/L (ref 20–32)
Calcium: 9.8 mg/dL (ref 8.6–10.3)
Chloride: 101 mmol/L (ref 98–110)
Creat: 0.76 mg/dL (ref 0.70–1.33)
GFR, Est African American: 116 mL/min/{1.73_m2} (ref >=60)
GFR, Est Non African American: 100 mL/min/{1.73_m2} (ref >=60)
Glucose, Bld: 116 mg/dL (ref 65–139)
Potassium: 4.8 mmol/L (ref 3.5–5.3)
Sodium: 136 mmol/L (ref 135–146)

## 2019-12-24 ENCOUNTER — Encounter: Payer: Self-pay | Admitting: Family Medicine

## 2019-12-24 NOTE — Progress Notes (Signed)
Thomas Schroeder,   Labs look great.

## 2019-12-27 NOTE — Telephone Encounter (Signed)
Lets schedule virtual visit for tomorrow.

## 2019-12-28 NOTE — Telephone Encounter (Signed)
Task completed. Pt has been scheduled a virtual appt on 12/29/19.

## 2019-12-29 ENCOUNTER — Telehealth (INDEPENDENT_AMBULATORY_CARE_PROVIDER_SITE_OTHER): Payer: BC Managed Care – PPO | Admitting: Family Medicine

## 2019-12-29 ENCOUNTER — Encounter: Payer: Self-pay | Admitting: Family Medicine

## 2019-12-29 DIAGNOSIS — T887XXA Unspecified adverse effect of drug or medicament, initial encounter: Secondary | ICD-10-CM | POA: Diagnosis not present

## 2019-12-29 DIAGNOSIS — E785 Hyperlipidemia, unspecified: Secondary | ICD-10-CM

## 2019-12-29 DIAGNOSIS — R61 Generalized hyperhidrosis: Secondary | ICD-10-CM | POA: Diagnosis not present

## 2019-12-29 NOTE — Progress Notes (Signed)
Virtual Visit via Video Note  I connected with Thomas Schroeder on 12/29/19 at 11:30 AM EDT by a video enabled telemedicine application and verified that I am speaking with the correct person using two identifiers.   I discussed the limitations of evaluation and management by telemedicine and the availability of in person appointments. The patient expressed understanding and agreed to proceed.  Subjective:    CC: Medication side effects  HPI: Thomas Schroeder received his COVID vaccine on March 25.  It was his first vaccine from [employer].  About 5 days later on March 30 he actually had some loose stools.  Then the following day on the 31st he started feeling very tired to the point that he was in bed for almost 2-1/2 days straight.  Just getting up for some brief stents.  He continued to remain fatigued and also started complaining of a headache on April 1.  The headache continued for about 3 days and he says it was actually really intense in fact that was part of why he actually stayed in bed was because of the severity of the headache.  He says he has had a couple of migraines in the past before and the intensity was somewhat similar but yet it was different.  He then developed a cough and a little bit of shortness of breath starting on Sunday, April 4.  He really feels like it was just congestion from being outside and also from having laid in bed for several days.  He feels like the cough is getting a little bit better he still has some phlegm in his chest he never developed any fever.  And he is in a study over at [facility] where they checked him for Covid frequently.  He did want to let me know that he had tried the ropinirole for the kicking in his sleep.  In fact he says it made it actually worse and actually caused spasms and it was actually giving him nightmares and dark dreams.  So after 2 full weeks he discontinued it.  We did add this to his intolerance list.  Then about a week later he tried the Lipitor.   He has been on it for a full week at this point.H e said that he has noticed some issues with the Lipitor and that this has caused him to have cramping at night. He said that he started taking this in the mornings. He reported that he sweats at the t8-t9 lesion and he sweats in his chair. He gets leg spasms at night .   He said that he has gone back to taking the Diazepam for the leg movements because this works better for him.   Past medical history, Surgical history, Family history not pertinant except as noted below, Social history, Allergies, and medications have been entered into the medical record, reviewed, and corrections made.   Review of Systems: No fevers, chills, night sweats, weight loss, chest pain, or shortness of breath.   Objective:    General: Speaking clearly in complete sentences without any shortness of breath.  Alert and oriented x3.  Normal judgment. No apparent acute distress.    Impression and Recommendations:    No problem-specific Assessment & Plan notes found for this encounter.  Medication side effect-did add the ropinirole to his intolerance list.  It actually worsened his sleep as well as the spasms.  We are trying to find something besides benzodiazepine that might be helpful for him.  He has  gone back to just using the diazepam.  Though some nights he actually does well without it.  Diaphoresis-unclear etiology am not sure if this is related to side effects from the vaccine or if the recent start of the statin could be a cause.  I was unable to find diuresis as a side effect.  I am to have him stop the statin for 2 to 3 weeks just to see if the symptoms resolve and then try to restart the medication at half a tab or either taking it every other day and see if the muscle aches return as well.  Hyperlipidemia-see note above about discontinuing the statin for short period and then a trial restart at a lower dose and or alternating.  We can always discuss switching  statins completely as well.    Time spent in encounter 35 minutes  I discussed the assessment and treatment plan with the patient. The patient was provided an opportunity to ask questions and all were answered. The patient agreed with the plan and demonstrated an understanding of the instructions.   The patient was advised to call back or seek an in-person evaluation if the symptoms worsen or if the condition fails to improve as anticipated.   [name], MD

## 2019-12-29 NOTE — Progress Notes (Signed)
Spoke w/pt and he reports that this is his 2nd week back to work. He states that he feels good physically.   Pt is unsure if his sxs are related to the COVID vaccination.   He said that he has noticed some issues with the Lipitor and that this has caused him to have cramping at night. He said that he started taking this in the mornings. He reported that he sweats at the t8-t9 lesion and he sweats in his chair. He gets leg spasms at night .   He said that he has gone back to taking the Diazepam because this works better for him.

## 2019-12-31 ENCOUNTER — Telehealth: Payer: BC Managed Care – PPO | Admitting: Family Medicine

## 2020-01-02 ENCOUNTER — Encounter: Payer: Self-pay | Admitting: Family Medicine

## 2020-03-06 ENCOUNTER — Encounter: Payer: Self-pay | Admitting: Family Medicine

## 2020-03-06 MED ORDER — ATORVASTATIN CALCIUM 20 MG PO TABS: 10.0000 mg | ORAL_TABLET | Freq: Every day | ORAL | 1 refills | Status: DC

## 2020-03-06 NOTE — Telephone Encounter (Signed)
Medication updated in chart.   Note to PCP about leg spasms

## 2020-04-19 ENCOUNTER — Other Ambulatory Visit: Payer: Self-pay

## 2020-04-19 ENCOUNTER — Emergency Department (HOSPITAL_COMMUNITY): Payer: BC Managed Care – PPO

## 2020-04-19 ENCOUNTER — Telehealth: Payer: Self-pay

## 2020-04-19 ENCOUNTER — Inpatient Hospital Stay (HOSPITAL_COMMUNITY)
Admission: EM | Admit: 2020-04-19 | Discharge: 2020-04-22 | DRG: 698 | Disposition: A | Discharge status: Home / Self Care | Payer: BC Managed Care – PPO | Attending: Internal Medicine | Admitting: Internal Medicine

## 2020-04-19 DIAGNOSIS — Z833 Family history of diabetes mellitus: Secondary | ICD-10-CM | POA: Diagnosis not present

## 2020-04-19 DIAGNOSIS — Z7982 Long term (current) use of aspirin: Secondary | ICD-10-CM

## 2020-04-19 DIAGNOSIS — F419 Anxiety disorder, unspecified: Secondary | ICD-10-CM | POA: Diagnosis present

## 2020-04-19 DIAGNOSIS — T83511A Infection and inflammatory reaction due to indwelling urethral catheter, initial encounter: Secondary | ICD-10-CM | POA: Diagnosis not present

## 2020-04-19 DIAGNOSIS — H6123 Impacted cerumen, bilateral: Secondary | ICD-10-CM | POA: Diagnosis not present

## 2020-04-19 DIAGNOSIS — T83518A Infection and inflammatory reaction due to other urinary catheter, initial encounter: Secondary | ICD-10-CM | POA: Diagnosis present

## 2020-04-19 DIAGNOSIS — Z87440 Personal history of urinary (tract) infections: Secondary | ICD-10-CM | POA: Diagnosis not present

## 2020-04-19 DIAGNOSIS — N390 Urinary tract infection, site not specified: Secondary | ICD-10-CM | POA: Diagnosis not present

## 2020-04-19 DIAGNOSIS — Y846 Urinary catheterization as the cause of abnormal reaction of the patient, or of later complication, without mention of misadventure at the time of the procedure: Secondary | ICD-10-CM | POA: Diagnosis present

## 2020-04-19 DIAGNOSIS — R197 Diarrhea, unspecified: Secondary | ICD-10-CM | POA: Diagnosis present

## 2020-04-19 DIAGNOSIS — Z888 Allergy status to other drugs, medicaments and biological substances status: Secondary | ICD-10-CM | POA: Diagnosis not present

## 2020-04-19 DIAGNOSIS — Z79899 Other long term (current) drug therapy: Secondary | ICD-10-CM

## 2020-04-19 DIAGNOSIS — G8220 Paraplegia, unspecified: Secondary | ICD-10-CM | POA: Diagnosis not present

## 2020-04-19 DIAGNOSIS — Z91040 Latex allergy status: Secondary | ICD-10-CM | POA: Diagnosis not present

## 2020-04-19 DIAGNOSIS — M62838 Other muscle spasm: Secondary | ICD-10-CM | POA: Diagnosis present

## 2020-04-19 DIAGNOSIS — R6520 Severe sepsis without septic shock: Secondary | ICD-10-CM | POA: Diagnosis not present

## 2020-04-19 DIAGNOSIS — Z83438 Family history of other disorder of lipoprotein metabolism and other lipidemia: Secondary | ICD-10-CM

## 2020-04-19 DIAGNOSIS — N10 Acute tubulo-interstitial nephritis: Secondary | ICD-10-CM | POA: Diagnosis present

## 2020-04-19 DIAGNOSIS — F1090 Alcohol use, unspecified, uncomplicated: Secondary | ICD-10-CM

## 2020-04-19 DIAGNOSIS — Z801 Family history of malignant neoplasm of trachea, bronchus and lung: Secondary | ICD-10-CM

## 2020-04-19 DIAGNOSIS — Z20822 Contact with and (suspected) exposure to covid-19: Secondary | ICD-10-CM | POA: Diagnosis not present

## 2020-04-19 DIAGNOSIS — A419 Sepsis, unspecified organism: Secondary | ICD-10-CM | POA: Diagnosis present

## 2020-04-19 DIAGNOSIS — E785 Hyperlipidemia, unspecified: Secondary | ICD-10-CM | POA: Diagnosis not present

## 2020-04-19 DIAGNOSIS — Z87442 Personal history of urinary calculi: Secondary | ICD-10-CM

## 2020-04-19 DIAGNOSIS — R6521 Severe sepsis with septic shock: Secondary | ICD-10-CM | POA: Diagnosis present

## 2020-04-19 DIAGNOSIS — Z7289 Other problems related to lifestyle: Secondary | ICD-10-CM

## 2020-04-19 DIAGNOSIS — F1010 Alcohol abuse, uncomplicated: Secondary | ICD-10-CM | POA: Diagnosis present

## 2020-04-19 DIAGNOSIS — Z789 Other specified health status: Secondary | ICD-10-CM

## 2020-04-19 LAB — CBC WITH DIFFERENTIAL/PLATELET
Abs Immature Granulocytes: 0.1 10*3/uL — ABNORMAL HIGH (ref 0.00–0.07)
Basophils Absolute: 0 10*3/uL (ref 0.0–0.1)
Basophils Relative: 0 %
Eosinophils Absolute: 0 10*3/uL (ref 0.0–0.5)
Eosinophils Relative: 0 %
HCT: 43.1 % (ref 39.0–52.0)
Hemoglobin: 13.7 g/dL (ref 13.0–17.0)
Immature Granulocytes: 1 %
Lymphocytes Relative: 3 %
Lymphs Abs: 0.5 10*3/uL — ABNORMAL LOW (ref 0.7–4.0)
MCH: 30.9 pg (ref 26.0–34.0)
MCHC: 31.8 g/dL (ref 30.0–36.0)
MCV: 97.1 fL (ref 80.0–100.0)
Monocytes Absolute: 1.2 10*3/uL — ABNORMAL HIGH (ref 0.1–1.0)
Monocytes Relative: 8 %
Neutro Abs: 12.7 10*3/uL — ABNORMAL HIGH (ref 1.7–7.7)
Neutrophils Relative %: 88 %
Platelets: 180 10*3/uL (ref 150–400)
RBC: 4.44 MIL/uL (ref 4.22–5.81)
RDW: 13.2 % (ref 11.5–15.5)
WBC: 14.5 10*3/uL — ABNORMAL HIGH (ref 4.0–10.5)
nRBC: 0 % (ref 0.0–0.2)

## 2020-04-19 LAB — URINALYSIS, MICROSCOPIC (REFLEX)

## 2020-04-19 LAB — URINALYSIS, ROUTINE W REFLEX MICROSCOPIC
Glucose, UA: 100 mg/dL — AB
Ketones, ur: NEGATIVE mg/dL
Nitrite: POSITIVE — AB
Protein, ur: >300 mg/dL — AB
Specific Gravity, Urine: 1.015 (ref 1.005–1.030)
pH: 6 (ref 5.0–8.0)

## 2020-04-19 LAB — COMPREHENSIVE METABOLIC PANEL
ALT: 51 U/L — ABNORMAL HIGH (ref 0–44)
AST: 32 U/L (ref 15–41)
Albumin: 3.4 g/dL — ABNORMAL LOW (ref 3.5–5.0)
Alkaline Phosphatase: 86 U/L (ref 38–126)
Anion gap: 10 (ref 5–15)
BUN: 8 mg/dL (ref 6–20)
CO2: 24 mmol/L (ref 22–32)
Calcium: 8.5 mg/dL — ABNORMAL LOW (ref 8.9–10.3)
Chloride: 100 mmol/L (ref 98–111)
Creatinine, Ser: 1.17 mg/dL (ref 0.61–1.24)
GFR calc Af Amer: >60 mL/min (ref >60)
GFR calc non Af Amer: >60 mL/min (ref >60)
Glucose, Bld: 167 mg/dL — ABNORMAL HIGH (ref 70–99)
Potassium: 3.7 mmol/L (ref 3.5–5.1)
Sodium: 134 mmol/L — ABNORMAL LOW (ref 135–145)
Total Bilirubin: 1.1 mg/dL (ref 0.3–1.2)
Total Protein: 6.8 g/dL (ref 6.5–8.1)

## 2020-04-19 LAB — PROTIME-INR
INR: 1.1 (ref 0.8–1.2)
Prothrombin Time: 13.4 seconds (ref 11.4–15.2)

## 2020-04-19 LAB — LACTIC ACID, PLASMA: Lactic Acid, Venous: 1.8 mmol/L (ref 0.5–1.9)

## 2020-04-19 MED ORDER — ACETAMINOPHEN 500 MG PO TABS
1000.0000 mg | ORAL_TABLET | Freq: Once | ORAL | Status: AC
  Administered 2020-04-19: 1000 mg via ORAL
  Filled 2020-04-19: qty 2

## 2020-04-19 MED ORDER — ACETAMINOPHEN 500 MG PO TABS: 1000.0000 mg | ORAL_TABLET | Freq: Once | ORAL | Status: DC

## 2020-04-19 NOTE — ED Triage Notes (Signed)
Pt reports mild headache since last week, then hyper-reflexia on Sunday, decreased appetite on Monday with diarrhea, Tmax 103 today with continued diarrhea and nausea. Increased urine output (foley catheter) since yesterday.

## 2020-04-19 NOTE — Telephone Encounter (Signed)
Pt's appointment at 1130 am cancelled with provider for UTI symptoms. Pt's wife called stating that pt has been with fever, chills, feeling weak and unable to get out of bed for the past couple of days. She mentioned that he has been having fever and the highest was 103.6. Pt's wife was informed to take pt to the nearest ER for evaluation of symptoms. Agreeable with plan. She will update the clinic as needed.

## 2020-04-20 ENCOUNTER — Ambulatory Visit: Payer: BC Managed Care – PPO | Admitting: Family Medicine

## 2020-04-20 DIAGNOSIS — Z7982 Long term (current) use of aspirin: Secondary | ICD-10-CM | POA: Diagnosis not present

## 2020-04-20 DIAGNOSIS — Z833 Family history of diabetes mellitus: Secondary | ICD-10-CM | POA: Diagnosis not present

## 2020-04-20 DIAGNOSIS — G8220 Paraplegia, unspecified: Secondary | ICD-10-CM | POA: Diagnosis present

## 2020-04-20 DIAGNOSIS — A419 Sepsis, unspecified organism: Secondary | ICD-10-CM | POA: Diagnosis present

## 2020-04-20 DIAGNOSIS — T83518A Infection and inflammatory reaction due to other urinary catheter, initial encounter: Secondary | ICD-10-CM | POA: Diagnosis present

## 2020-04-20 DIAGNOSIS — F1010 Alcohol abuse, uncomplicated: Secondary | ICD-10-CM | POA: Diagnosis present

## 2020-04-20 DIAGNOSIS — Z20822 Contact with and (suspected) exposure to covid-19: Secondary | ICD-10-CM | POA: Diagnosis present

## 2020-04-20 DIAGNOSIS — Z79899 Other long term (current) drug therapy: Secondary | ICD-10-CM | POA: Diagnosis not present

## 2020-04-20 DIAGNOSIS — Z801 Family history of malignant neoplasm of trachea, bronchus and lung: Secondary | ICD-10-CM | POA: Diagnosis not present

## 2020-04-20 DIAGNOSIS — Z789 Other specified health status: Secondary | ICD-10-CM

## 2020-04-20 DIAGNOSIS — N10 Acute tubulo-interstitial nephritis: Secondary | ICD-10-CM | POA: Diagnosis present

## 2020-04-20 DIAGNOSIS — Z7289 Other problems related to lifestyle: Secondary | ICD-10-CM

## 2020-04-20 DIAGNOSIS — F419 Anxiety disorder, unspecified: Secondary | ICD-10-CM | POA: Diagnosis present

## 2020-04-20 DIAGNOSIS — R197 Diarrhea, unspecified: Secondary | ICD-10-CM | POA: Diagnosis present

## 2020-04-20 DIAGNOSIS — Z888 Allergy status to other drugs, medicaments and biological substances status: Secondary | ICD-10-CM | POA: Diagnosis not present

## 2020-04-20 DIAGNOSIS — Y846 Urinary catheterization as the cause of abnormal reaction of the patient, or of later complication, without mention of misadventure at the time of the procedure: Secondary | ICD-10-CM | POA: Diagnosis present

## 2020-04-20 DIAGNOSIS — Z87440 Personal history of urinary (tract) infections: Secondary | ICD-10-CM | POA: Diagnosis not present

## 2020-04-20 DIAGNOSIS — T83511A Infection and inflammatory reaction due to indwelling urethral catheter, initial encounter: Principal | ICD-10-CM

## 2020-04-20 DIAGNOSIS — R6521 Severe sepsis with septic shock: Secondary | ICD-10-CM | POA: Diagnosis present

## 2020-04-20 DIAGNOSIS — H6123 Impacted cerumen, bilateral: Secondary | ICD-10-CM | POA: Diagnosis present

## 2020-04-20 DIAGNOSIS — F1090 Alcohol use, unspecified, uncomplicated: Secondary | ICD-10-CM

## 2020-04-20 DIAGNOSIS — N390 Urinary tract infection, site not specified: Secondary | ICD-10-CM | POA: Diagnosis present

## 2020-04-20 DIAGNOSIS — Z87442 Personal history of urinary calculi: Secondary | ICD-10-CM | POA: Diagnosis not present

## 2020-04-20 DIAGNOSIS — E785 Hyperlipidemia, unspecified: Secondary | ICD-10-CM | POA: Diagnosis present

## 2020-04-20 DIAGNOSIS — M62838 Other muscle spasm: Secondary | ICD-10-CM | POA: Diagnosis present

## 2020-04-20 DIAGNOSIS — Z91040 Latex allergy status: Secondary | ICD-10-CM | POA: Diagnosis not present

## 2020-04-20 DIAGNOSIS — Z83438 Family history of other disorder of lipoprotein metabolism and other lipidemia: Secondary | ICD-10-CM | POA: Diagnosis not present

## 2020-04-20 LAB — CBC
HCT: 40.7 % (ref 39.0–52.0)
Hemoglobin: 13.3 g/dL (ref 13.0–17.0)
MCH: 31.4 pg (ref 26.0–34.0)
MCHC: 32.7 g/dL (ref 30.0–36.0)
MCV: 96.2 fL (ref 80.0–100.0)
Platelets: 182 10*3/uL (ref 150–400)
RBC: 4.23 MIL/uL (ref 4.22–5.81)
RDW: 13.3 % (ref 11.5–15.5)
WBC: 10.8 10*3/uL — ABNORMAL HIGH (ref 4.0–10.5)
nRBC: 0 % (ref 0.0–0.2)

## 2020-04-20 LAB — LACTIC ACID, PLASMA: Lactic Acid, Venous: 1.3 mmol/L (ref 0.5–1.9)

## 2020-04-20 LAB — SARS CORONAVIRUS 2 BY RT PCR (HOSPITAL ORDER, PERFORMED IN ~~LOC~~ HOSPITAL LAB): SARS Coronavirus 2: NEGATIVE

## 2020-04-20 LAB — PHOSPHORUS: Phosphorus: 1.8 mg/dL — ABNORMAL LOW (ref 2.5–4.6)

## 2020-04-20 LAB — HIV ANTIBODY (ROUTINE TESTING W REFLEX): HIV Screen 4th Generation wRfx: NONREACTIVE

## 2020-04-20 LAB — MAGNESIUM: Magnesium: 2.1 mg/dL (ref 1.7–2.4)

## 2020-04-20 MED ORDER — CIPROFLOXACIN IN D5W 400 MG/200ML IV SOLN
400.0000 mg | Freq: Once | INTRAVENOUS | Status: AC
  Administered 2020-04-20: 400 mg via INTRAVENOUS
  Filled 2020-04-20: qty 200

## 2020-04-20 MED ORDER — ACETAMINOPHEN 650 MG RE SUPP: 650.0000 mg | Freq: Four times a day (QID) | RECTAL | Status: DC | PRN

## 2020-04-20 MED ORDER — ATORVASTATIN CALCIUM 10 MG PO TABS
10.0000 mg | ORAL_TABLET | Freq: Every day | ORAL | Status: DC
  Administered 2020-04-20 – 2020-04-22 (×3): 10 mg via ORAL
  Filled 2020-04-20 (×3): qty 1

## 2020-04-20 MED ORDER — DIAZEPAM 5 MG PO TABS: 20.0000 mg | ORAL_TABLET | Freq: Three times a day (TID) | ORAL | Status: DC | PRN

## 2020-04-20 MED ORDER — CIPROFLOXACIN IN D5W 400 MG/200ML IV SOLN
400.0000 mg | Freq: Two times a day (BID) | INTRAVENOUS | Status: DC
  Administered 2020-04-20 – 2020-04-22 (×4): 400 mg via INTRAVENOUS
  Filled 2020-04-20 (×5): qty 200

## 2020-04-20 MED ORDER — THIAMINE HCL 100 MG PO TABS
100.0000 mg | ORAL_TABLET | Freq: Every day | ORAL | Status: DC
  Administered 2020-04-20 – 2020-04-22 (×3): 100 mg via ORAL
  Filled 2020-04-20 (×3): qty 1

## 2020-04-20 MED ORDER — CHLORHEXIDINE GLUCONATE CLOTH 2 % EX PADS
6.0000 | MEDICATED_PAD | Freq: Every day | CUTANEOUS | Status: DC
  Administered 2020-04-20 – 2020-04-22 (×3): 6 via TOPICAL

## 2020-04-20 MED ORDER — LORAZEPAM 2 MG/ML IJ SOLN: 0.0000 mg | Freq: Two times a day (BID) | INTRAMUSCULAR | Status: DC

## 2020-04-20 MED ORDER — SODIUM CHLORIDE 0.9 % IV BOLUS
2000.0000 mL | Freq: Once | INTRAVENOUS | Status: AC
  Administered 2020-04-20: 2000 mL via INTRAVENOUS

## 2020-04-20 MED ORDER — ACETAMINOPHEN 325 MG PO TABS
650.0000 mg | ORAL_TABLET | Freq: Four times a day (QID) | ORAL | Status: DC | PRN
  Administered 2020-04-20 (×2): 650 mg via ORAL
  Filled 2020-04-20 (×2): qty 2

## 2020-04-20 MED ORDER — ADULT MULTIVITAMIN W/MINERALS CH
1.0000 | ORAL_TABLET | Freq: Every day | ORAL | Status: DC
  Administered 2020-04-20 – 2020-04-22 (×3): 1 via ORAL
  Filled 2020-04-20 (×3): qty 1

## 2020-04-20 MED ORDER — IBUPROFEN 600 MG PO TABS
600.0000 mg | ORAL_TABLET | Freq: Four times a day (QID) | ORAL | Status: DC | PRN
  Administered 2020-04-20 – 2020-04-22 (×3): 600 mg via ORAL
  Filled 2020-04-20 (×3): qty 1

## 2020-04-20 MED ORDER — ENOXAPARIN SODIUM 40 MG/0.4ML ~~LOC~~ SOLN
40.0000 mg | SUBCUTANEOUS | Status: DC
  Administered 2020-04-20 – 2020-04-22 (×3): 40 mg via SUBCUTANEOUS
  Filled 2020-04-20 (×3): qty 0.4

## 2020-04-20 MED ORDER — LORAZEPAM 2 MG/ML IJ SOLN: 0.0000 mg | Freq: Four times a day (QID) | INTRAMUSCULAR | Status: AC

## 2020-04-20 MED ORDER — FOLIC ACID 1 MG PO TABS
1.0000 mg | ORAL_TABLET | Freq: Every day | ORAL | Status: DC
  Administered 2020-04-20 – 2020-04-22 (×3): 1 mg via ORAL
  Filled 2020-04-20 (×3): qty 1

## 2020-04-20 MED ORDER — THIAMINE HCL 100 MG/ML IJ SOLN: 100.0000 mg | Freq: Every day | INTRAMUSCULAR | Status: DC

## 2020-04-20 MED ORDER — ASPIRIN 325 MG PO TABS
325.0000 mg | ORAL_TABLET | Freq: Every day | ORAL | Status: DC
  Administered 2020-04-20 – 2020-04-22 (×3): 325 mg via ORAL
  Filled 2020-04-20 (×3): qty 1

## 2020-04-20 NOTE — Progress Notes (Signed)
Pharmacy Antibiotic Note  Thomas Schroeder is a 59 y.o. male admitted on 04/19/2020 with UTI.  Pharmacy has been consulted for Ciprofloxacin dosing. Pt with allergies to Bactrim/Cefixime (rash and swelling)  Plan: Ciprofloxacin 400mg  IV q12h Will f/u micro data, renal function, and pt's clinical condition     Temp (24hrs), Avg:102.3 F (39.1 C), Min:101.6 F (38.7 C), Max:102.9 F (39.4 C)  Recent Labs  Lab 04/19/20 1753  WBC 14.5*  CREATININE 1.17  LATICACIDVEN 1.8    CrCl cannot be calculated (Unknown ideal weight.).    Allergies  Allergen Reactions  . Bactrim Rash    Skin sloughing   . Suprax [Cefixime] Swelling  . Ropinirole Other (See Comments)    Increased leg spasms/poor sleep.   . Latex Rash    Antimicrobials this admission: 8/5 Cipro >>   Microbiology results: 8/4 BCx:   UCx:   Cdiff:   Thank you for allowing pharmacy to be a part of this patient's care.  Sherlon Handing, PharmD, BCPS Please see amion for complete clinical pharmacist phone list 04/20/2020 1:35 AM

## 2020-04-20 NOTE — ED Notes (Signed)
Pt offered PRN Tylenol for fever, pt states " If I could have it for my head instead that would be nice."
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
## 2020-04-20 NOTE — H&P (Addendum)
History and Physical    Thomas Schroeder DOB: 03/21/1961 DOA: 04/19/2020  PCP: Hali Marry, MD Patient coming from: Home  Chief Complaint: Fever, diarrhea  HPI: Thomas Schroeder is a 59 y.o. male with medical history significant of paraplegia, chronic indwelling Foley catheter, history of recurrent UTIs, hyperlipidemia, anxiety, alcohol use disorder presenting with complaints of fever and diarrhea.  Patient states for the past 3 days he is having fevers and diarrhea for the past 2 days.  He has had abdominal muscle spasms and slight cramping.  Denies recent antibiotic use.  He changes his indwelling Foley catheter on his own.  He last changed it 2 weeks ago.  He has been vaccinated against Covid.  Denies cough, shortness of breath, or chest pain.  ED Course: Febrile with temperature 102.9 F.  Slightly tachycardic and tachypneic.  Not hypotensive.  Not hypoxic.  WBC count 14.5.  Lactic acid normal.  UA with positive nitrite, moderate amount of leukocytes, 21-50 WBCs, and many bacteria.  Blood culture x2 pending.  SARS-CoV-2 PCR test pending.  Chest x-ray not suggestive of pneumonia.  Patient was given Tylenol and Cipro.  Review of Systems:  All systems reviewed and apart from history of presenting illness, are negative.  Past Medical History:  Diagnosis Date  . Allergy   . Chronic indwelling Foley catheter   . Chronic kidney disease   . ED (erectile dysfunction)   . History of recurrent UTIs   . Night muscle spasms   . Paraplegia [facility])     Past Surgical History:  Procedure Laterality Date  . APPENDECTOMY    . FEMUR FRACTURE SURGERY    . kidney stone removal    . RT tibia fracture    . T8 T9 fusion     . TONSILLECTOMY       reports that he has never smoked. He has never used smokeless tobacco. He reports current alcohol use of about 10.0 standard drinks of alcohol per week. No history on file for drug use.  Allergies  Allergen Reactions  . Bactrim Rash     Skin sloughing   . Suprax [Cefixime] Swelling  . Ropinirole Other (See Comments)    Increased leg spasms/poor sleep.   . Latex Rash    Family History  Problem Relation Age of Onset  . Hyperlipidemia Mother   . Diabetes Mother   . Rosacea Mother   . Hypertension Neg Hx   . Lung cancer Mother        smoker    Prior to Admission medications   Medication Sig Start Date End Date Taking? Authorizing Provider  ascorbic acid (VITAMIN C) 1000 MG tablet Take by mouth.    [provider]  aspirin 325 MG tablet Take 325 mg by mouth daily.      [provider]  atorvastatin (LIPITOR) 20 MG tablet Take 0.5 tablets (10 mg total) by mouth daily. 03/06/20   Hali Marry, MD  CRANBERRY EXTRACT PO Take 1 capsule by mouth daily.    [provider]  diazepam (VALIUM) 10 MG tablet Take 2 tablets (20 mg total) by mouth every 8 (eight) hours as needed. 11/10/19   Hali Marry, MD  Multiple Vitamin (MULTI VITAMIN MENS PO) Take 1 tablet by mouth daily.     [provider]  Omega-3 Fatty Acids (FISH OIL) 1000 MG CAPS Take 1 capsule by mouth daily.    [provider]    Physical Exam: Vitals:  04/19/20 1731 04/20/20 0011 04/20/20 0100  BP: 123/71 (!) 153/92 129/79  Pulse: (!) 103 99 91  Resp: 20 (!) 21 (!) 25  Temp: (!) 102.9 F (39.4 C) (!) 101.6 F (38.7 C)   TempSrc:  Oral   SpO2: 98% 100% 99%    Physical Exam Constitutional:      Appearance: He is toxic-appearing.  HENT:     Head: Normocephalic and atraumatic.     Mouth/Throat:     Mouth: Mucous membranes are moist.  Eyes:     Comments: Unable to examine as patient had his eyes covered with a facemask  Cardiovascular:     Rate and Rhythm: Normal rate and regular rhythm.     Pulses: Normal pulses.  Pulmonary:     Effort: Pulmonary effort is normal. No respiratory distress.     Breath sounds: Normal breath sounds. No wheezing or rales.  Abdominal:     General: Bowel sounds  are normal.     Palpations: Abdomen is soft.     Tenderness: There is no abdominal tenderness. There is no guarding or rebound.     Comments: Abdominal muscle spasms noted  Musculoskeletal:        General: No swelling.     Cervical back: Normal range of motion and neck supple.  Skin:    General: Skin is warm and dry.  Neurological:     Mental Status: He is alert and oriented to person, place, and time.     Labs on Admission: I have personally reviewed following labs and imaging studies  CBC: Recent Labs  Lab 04/19/20 1753  WBC 14.5*  NEUTROABS 12.7*  HGB 13.7  HCT 43.1  MCV 97.1  PLT 419   Basic Metabolic Panel: Recent Labs  Lab 04/19/20 1753  NA 134*  K 3.7  CL 100  CO2 24  GLUCOSE 167*  BUN 8  CREATININE 1.17  CALCIUM 8.5*   GFR: CrCl cannot be calculated (Unknown ideal weight.). Liver Function Tests: Recent Labs  Lab 04/19/20 1753  AST 32  ALT 51*  ALKPHOS 86  BILITOT 1.1  PROT 6.8  ALBUMIN 3.4*   No results for input(s): LIPASE, AMYLASE in the last 168 hours. No results for input(s): AMMONIA in the last 168 hours. Coagulation Profile: Recent Labs  Lab 04/19/20 1753  INR 1.1   Cardiac Enzymes: No results for input(s): CKTOTAL, CKMB, CKMBINDEX, TROPONINI in the last 168 hours. BNP (last 3 results) No results for input(s): PROBNP in the last 8760 hours. HbA1C: No results for input(s): HGBA1C in the last 72 hours. CBG: No results for input(s): GLUCAP in the last 168 hours. Lipid Profile: No results for input(s): CHOL, HDL, LDLCALC, TRIG, CHOLHDL, LDLDIRECT in the last 72 hours. Thyroid Function Tests: No results for input(s): TSH, T4TOTAL, FREET4, T3FREE, THYROIDAB in the last 72 hours. Anemia Panel: No results for input(s): VITAMINB12, FOLATE, FERRITIN, TIBC, IRON, RETICCTPCT in the last 72 hours. Urine analysis:    Component Value Date/Time   COLORURINE YELLOW 04/19/2020 2112   APPEARANCEUR CLOUDY (A) 04/19/2020 2112   LABSPEC 1.015  04/19/2020 2112   PHURINE 6.0 04/19/2020 2112   GLUCOSEU 100 (A) 04/19/2020 2112   HGBUR LARGE (A) 04/19/2020 2112   HGBUR large 06/19/2010 1630   BILIRUBINUR SMALL (A) 04/19/2020 2112   BILIRUBINUR Negative 08/27/2018 0920   KETONESUR NEGATIVE 04/19/2020 2112   PROTEINUR >300 (A) 04/19/2020 2112   UROBILINOGEN 0.2 08/27/2018 0920   UROBILINOGEN 0.2 06/19/2010 1630   NITRITE  POSITIVE (A) 04/19/2020 2112   LEUKOCYTESUR MODERATE (A) 04/19/2020 2112    Radiological Exams on Admission: DG Chest 2 View  Result Date: 04/19/2020 CLINICAL DATA:  Suspected sepsis EXAM: CHEST - 2 VIEW COMPARISON:  None FINDINGS: Trachea is midline. Cardiomediastinal contours and hilar structures are normal. Lungs are clear. No sign of pleural effusion. Visualized skeletal structures on limited assessment are unremarkable. IMPRESSION: No acute cardiopulmonary disease. Electronically Signed   By: Zetta Bills M.D.   On: 04/19/2020 18:17    Assessment/Plan Principal Problem:   Catheter-associated urinary tract infection (HCC) Active Problems:   Diarrhea   Sepsis ([place])   Alcohol use   Anxiety   Sepsis secondary to CAUTI: Febrile.  Slightly tachycardic and tachypneic on arrival.  Labs showing leukocytosis-WBC count 14.5.  Lactic acid normal. UA with positive nitrite, moderate amount of leukocytes, 21-50 WBCs, and many bacteria.  Patient with history of paraplegia and chronic indwelling Foley catheter.  History of recurrent UTIs and prior urine culture growing E. coli and Pseudomonas sensitive to Cipro. -Continue Cipro.  Order 30 cc/kg IV fluid boluses per sepsis protocol.  Tylenol as needed for fevers.  Order urine culture.  Blood culture x2 pending.  Continue to monitor WBC count.  Replace Foley catheter.  Diarrhea: Febrile and labs showing mild leukocytosis in the setting of CAUTI.  No abdominal tenderness on exam. -C. difficile PCR and GI pathogen panel.  Follow enteric  precautions.  Hyperlipidemia -Continue Lipitor  Anxiety -Ativan as needed  Alcohol use disorder -CIWA protocol; Ativan as needed.  Thiamine, folate, and multivitamin.  DVT prophylaxis: Lovenox Code Status: Patient wishes to be full code. Family Communication: No family available at this time. Disposition Plan: Status is: Inpatient  Remains inpatient appropriate because:IV treatments appropriate due to intensity of illness or inability to take PO and Inpatient level of care appropriate due to severity of illness   Dispo: The patient is from: Home              Anticipated d/c is to: SNF              Anticipated d/c date is: 3 days              Patient currently is not medically stable to d/c.  The medical decision making on this patient was of high complexity and the patient is at high risk for clinical deterioration, therefore this is a level 3 visit.  Shela Leff MD [facility]  If 7PM-7AM, please contact night-coverage [email or web address]  04/20/2020, 1:57 AM

## 2020-04-20 NOTE — ED Notes (Addendum)
Pt provided hygiene care, sacrum assess redness noted to scrotum and below cocyx blanchable.  Stool formed unable to collect sample for C.DIFF collection

## 2020-04-20 NOTE — Progress Notes (Signed)
PROGRESS NOTE    Thomas Schroeder  ZOX:[id number] DOB: Jun 01, 1961 DOA: 04/19/2020 PCP: Hali Marry, MD    Brief Narrative:  [name] is a 59 y.o. male with medical history significant of paraplegia, chronic indwelling Foley catheter, history of recurrent UTIs, hyperlipidemia, anxiety, alcohol use disorder presenting with complaints of fever and diarrhea.  Patient states for the past 3 days he is having fevers and diarrhea for the past 2 days.  He has had abdominal muscle spasms and slight cramping.  Denies recent antibiotic use.  He changes his indwelling Foley catheter on his own.  He last changed it 2 weeks ago.  He has been vaccinated against Covid.  Denies cough, shortness of breath, or chest pain.  In the ED, temperature 102.9 F.  Slightly tachycardic and tachypneic.  Not hypotensive.  Not hypoxic.  WBC count 14.5.  Lactic acid normal.  UA with positive nitrite, moderate amount of leukocytes, 21-50 WBCs, and many bacteria.  Blood culture x2 pending.  SARS-CoV-2 PCR negative. EDP consulted [facility] for admission for sepsis 2/2 UTI.   Assessment & Plan:   Principal Problem:   Catheter-associated urinary tract infection (HCC) Active Problems:   Diarrhea   Sepsis ([place])   Alcohol use   Anxiety   Sepsis, present on admission Catheter associated urinary tract infection Presenting to the ED with a temperature 102.9, tachycardic, tachypneic with a leukocytosis of 14.5.  Urinalysis with positive nitrite, moderate leukoesterase, 21-50 WBCs and many bacteria.  Patient with history of paraplegia and chronic indwelling Foley catheter and history of recurrent UTIs with previous cultures growing E. coli and Pseudomonas.  Diarrhea [place] any recent antibiotic use.  He is febrile with mild leukocytosis in the setting of UTI as above.   --GI PCR pathogen panel and C. difficile PCR: Pending --Continue enteric precautions for now  Hyperlipidemia: Continue atorvastatin  Anxiety: Ativan as  needed  EtOH use disorder: --CIWAA protocol with symptom triggered Ativan --Thiamine, folate, multivitamin   DVT prophylaxis: Lovenox Code Status: Full code Family Communication: No family present at bedside  Disposition Plan:  Status is: Inpatient  Remains inpatient appropriate because:Ongoing active pain requiring inpatient pain management, Ongoing diagnostic testing needed not appropriate for outpatient work up, Unsafe d/c plan and IV treatments appropriate due to intensity of illness or inability to take PO   Dispo: The patient is from: Home              Anticipated d/c is to: Home              Anticipated d/c date is: 3 days              Patient currently is not medically stable to d/c.   Consultants:   none  Procedures:   none  Antimicrobials:   Ciprofloxacin 8/5>>   Subjective: Patient seen and examined at bedside, resting comfortably.  Continues with diarrhea.  Has had poor oral intake over the preceding days with associated weakness and fatigue.  At baseline he is able to transfer himself from bed to wheelchair and to his vehicle and is fairly well mobile with the use of devices.  Fever curve improving.  No other specific complaints this morning.  Denies headache, no chills/night sweats, no nausea/vomiting, no chest pain, no palpitations, no abdominal pain.  No acute events overnight per nursing staff.  Objective: Vitals:   04/20/20 0451 04/20/20 0520 04/20/20 0947 04/20/20 1100  BP: 113/78 94/79 106/69   Pulse: 88 96 84  Resp: 17  17   Temp: (!) 101.3 F (38.5 C) 99.1 F (37.3 C) 97.9 F (36.6 C)   TempSrc: Oral Oral Oral   SpO2: 99% 99% 100%   Weight:    81.6 kg  Height:    5\' 6"  (1.676 m)    Intake/Output Summary (Last 24 hours) at 04/20/2020 1401 Last data filed at 04/20/2020 1234 Gross per 24 hour  Intake 2622.4 ml  Output 1400 ml  Net 1222.4 ml   Filed Weights   04/20/20 1100  Weight: 81.6 kg    Examination:  General exam: Appears calm  and comfortable  Respiratory system: Clear to auscultation. Respiratory effort normal.  Oxygenating well on room air Cardiovascular system: S1 & S2 heard, RRR. No JVD, murmurs, rubs, gallops or clicks. No pedal edema. Gastrointestinal system: Abdomen is nondistended, soft and nontender. No organomegaly or masses felt. Normal bowel sounds heard. Central nervous system: Alert and oriented. No focal neurological deficits. Extremities: No peripheral edema, moves upper extremities independently, lower extremities muscle strength 0/5 Skin: No rashes, lesions or ulcers Psychiatry: Judgement and insight appear normal. Mood & affect appropriate.     Data Reviewed: I have personally reviewed following labs and imaging studies  CBC: Recent Labs  Lab 04/19/20 1753 04/20/20 0526  WBC 14.5* 10.8*  NEUTROABS 12.7*  --   HGB 13.7 13.3  HCT 43.1 40.7  MCV 97.1 96.2  PLT 180 778   Basic Metabolic Panel: Recent Labs  Lab 04/19/20 1753 04/20/20 0526  NA 134*  --   K 3.7  --   CL 100  --   CO2 24  --   GLUCOSE 167*  --   BUN 8  --   CREATININE 1.17  --   CALCIUM 8.5*  --   MG  --  2.1  PHOS  --  1.8*   GFR: Estimated Creatinine Clearance: 68.2 mL/min (by C-G formula based on SCr of 1.17 mg/dL). Liver Function Tests: Recent Labs  Lab 04/19/20 1753  AST 32  ALT 51*  ALKPHOS 86  BILITOT 1.1  PROT 6.8  ALBUMIN 3.4*   No results for input(s): LIPASE, AMYLASE in the last 168 hours. No results for input(s): AMMONIA in the last 168 hours. Coagulation Profile: Recent Labs  Lab 04/19/20 1753  INR 1.1   Cardiac Enzymes: No results for input(s): CKTOTAL, CKMB, CKMBINDEX, TROPONINI in the last 168 hours. BNP (last 3 results) No results for input(s): PROBNP in the last 8760 hours. HbA1C: No results for input(s): HGBA1C in the last 72 hours. CBG: No results for input(s): GLUCAP in the last 168 hours. Lipid Profile: No results for input(s): CHOL, HDL, LDLCALC, TRIG, CHOLHDL,  LDLDIRECT in the last 72 hours. Thyroid Function Tests: No results for input(s): TSH, T4TOTAL, FREET4, T3FREE, THYROIDAB in the last 72 hours. Anemia Panel: No results for input(s): VITAMINB12, FOLATE, FERRITIN, TIBC, IRON, RETICCTPCT in the last 72 hours. Sepsis Labs: Recent Labs  Lab 04/19/20 1753 04/20/20 0526  LATICACIDVEN 1.8 1.3    Recent Results (from the past 240 hour(s))  SARS Coronavirus 2 by RT PCR (hospital order, performed in [facility] hospital lab) Nasopharyngeal Nasopharyngeal Swab     Status: None   Collection Time: 04/20/20  1:22 AM   Specimen: Nasopharyngeal Swab  Result Value Ref Range Status   SARS Coronavirus 2 NEGATIVE NEGATIVE Final    Comment: (NOTE) SARS-CoV-2 target nucleic acids are NOT DETECTED.  The SARS-CoV-2 RNA is generally detectable in upper and lower respiratory  specimens during the acute phase of infection. The lowest concentration of SARS-CoV-2 viral copies this assay can detect is 250 copies / mL. A negative result does not preclude SARS-CoV-2 infection and should not be used as the sole basis for treatment or other patient management decisions.  A negative result may occur with improper specimen collection / handling, submission of specimen other than nasopharyngeal swab, presence of viral mutation(s) within the areas targeted by this assay, and inadequate number of viral copies (<250 copies / mL). A negative result must be combined with clinical observations, patient history, and epidemiological information.  Fact Sheet for Patients:   StrictlyIdeas.no  Fact Sheet for Healthcare Providers: BankingDealers.co.za  This test is not yet approved or  cleared by the [place] FDA and has been authorized for detection and/or diagnosis of SARS-CoV-2 by FDA under an Emergency Use Authorization (EUA).  This EUA will remain in effect (meaning this test can be used) for the duration of  the COVID-19 declaration under Section 564(b)(1) of the Act, 21 U.S.C. section 360bbb-3(b)(1), unless the authorization is terminated or revoked sooner.  Performed at [facility], [place] [address] [place], [place] [address]          Radiology Studies: DG Chest 2 View  Result Date: 04/19/2020 CLINICAL DATA:  Suspected sepsis EXAM: CHEST - 2 VIEW COMPARISON:  None FINDINGS: Trachea is midline. Cardiomediastinal contours and hilar structures are normal. Lungs are clear. No sign of pleural effusion. Visualized skeletal structures on limited assessment are unremarkable. IMPRESSION: No acute cardiopulmonary disease. Electronically Signed   By: Zetta Bills M.D.   On: 04/19/2020 18:17        Scheduled Meds: . aspirin  325 mg Oral Daily  . atorvastatin  10 mg Oral q1800  . Chlorhexidine Gluconate Cloth  6 each Topical Daily  . enoxaparin (LOVENOX) injection  40 mg Subcutaneous Q24H  . folic acid  1 mg Oral Daily  . LORazepam  0-4 mg Intravenous Q6H   Followed by  . [START ON 04/22/2020] LORazepam  0-4 mg Intravenous Q12H  . multivitamin with minerals  1 tablet Oral Daily  . thiamine  100 mg Oral Daily   Or  . thiamine  100 mg Intravenous Daily   Continuous Infusions: . ciprofloxacin 400 mg (04/20/20 1234)     LOS: 0 days    Time spent: 39 minutes spent on chart review, discussion with nursing staff, consultants, updating family and interview/physical exam; more than 50% of that time was spent in counseling and/or coordination of care.    Riley Hallum J [place] DO [facility] Available via Epic secure chat 7am-7pm After these hours, please refer to coverage provider listed on [email or web address] 04/20/2020, 2:01 PM

## 2020-04-21 LAB — CBC
HCT: 41 % (ref 39.0–52.0)
Hemoglobin: 13.4 g/dL (ref 13.0–17.0)
MCH: 31.4 pg (ref 26.0–34.0)
MCHC: 32.7 g/dL (ref 30.0–36.0)
MCV: 96 fL (ref 80.0–100.0)
Platelets: 201 10*3/uL (ref 150–400)
RBC: 4.27 MIL/uL (ref 4.22–5.81)
RDW: 13.2 % (ref 11.5–15.5)
WBC: 7.8 10*3/uL (ref 4.0–10.5)
nRBC: 0 % (ref 0.0–0.2)

## 2020-04-21 LAB — BASIC METABOLIC PANEL
Anion gap: 10 (ref 5–15)
BUN: 9 mg/dL (ref 6–20)
CO2: 23 mmol/L (ref 22–32)
Calcium: 8.5 mg/dL — ABNORMAL LOW (ref 8.9–10.3)
Chloride: 104 mmol/L (ref 98–111)
Creatinine, Ser: 0.92 mg/dL (ref 0.61–1.24)
GFR calc Af Amer: >60 mL/min (ref >60)
GFR calc non Af Amer: >60 mL/min (ref >60)
Glucose, Bld: 161 mg/dL — ABNORMAL HIGH (ref 70–99)
Potassium: 3.9 mmol/L (ref 3.5–5.1)
Sodium: 137 mmol/L (ref 135–145)

## 2020-04-21 LAB — MAGNESIUM: Magnesium: 2 mg/dL (ref 1.7–2.4)

## 2020-04-21 MED ORDER — DIAZEPAM 2 MG PO TABS
10.0000 mg | ORAL_TABLET | Freq: Three times a day (TID) | ORAL | Status: DC | PRN
  Administered 2020-04-21: 10 mg via ORAL
  Filled 2020-04-21: qty 5

## 2020-04-21 NOTE — Social Work (Addendum)
Noted consult for substance use, no needs for resources noted by bedside RN in that area, scoring 0 on CIWA. On ativan for anxiety per documentation from MD.  [place] signing off. Please consult if any additional needs arise.  Alexander Mt, MSW, [place] Work

## 2020-04-21 NOTE — Evaluation (Signed)
Occupational Therapy Evaluation Patient Details Name: Thomas Schroeder MRN: [id number] DOB: [birth year] Today's Date: 04/21/2020    History of Present Illness 59 y.o male presenting with UTI from indwelling catheter. PMH includes paraplegia, chronic indwelling Foley catheter, history of recurrent UTIs, hyperlipidemia, anxiety, alcohol use disorder   Clinical Impression   PTA pt living with significant other and her adult children. 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 He endorses recent weakness with being sick and requiring increased assist from girlfriend who is a PT. Reviewed pts typical BADL routine and equipment needs. He stated his normal routine and how he feels he has his needs under control at this time. He has no other needs for equipment. He is able to outline how to perform his care and safety ask for help as needed. He denies further OT intervention at this time. OT will sign off, thank you for this consult.    Follow Up Recommendations  No OT follow up    Equipment Recommendations  None recommended by OT (pt reports having all needed equipment)    Recommendations for Other Services       Precautions / Restrictions Precautions Precautions: None Precaution Comments: T9 paraplegia Restrictions Weight Bearing Restrictions: No      Mobility Bed Mobility Overal bed mobility: Needs Assistance Bed Mobility: Rolling Rolling: Min assist         General bed mobility comments: min A to roll and reposition in bed  Transfers                      Balance                                           ADL either performed or assessed with clinical judgement   ADL Overall ADL's : At baseline                                       General ADL Comments: Pt reports being at baseline for BADLs and states he has all needed help at home as well as equipment. Reviewed bathing, dressing, toileting routine, and IADLs  with pt. He ensure he has it "under control"     Vision Baseline Vision/History: Wears glasses Wears Glasses: At all times Patient Visual Report: No change from baseline       Perception     Praxis      Pertinent Vitals/Pain Pain Assessment: No/denies pain     Hand Dominance     Extremity/Trunk Assessment Upper Extremity Assessment Upper Extremity Assessment: Overall WFL for tasks assessed   Lower Extremity Assessment Lower Extremity Assessment:  (baseline T9 paraplegia)   Cervical / Trunk Assessment Cervical / Trunk Assessment: Other exceptions Cervical / Trunk Exceptions: T9 paraplegia   Communication Communication Communication: No difficulties   Cognition Arousal/Alertness: Awake/alert Behavior During Therapy: WFL for tasks assessed/performed Overall Cognitive Status: Within Functional Limits for tasks assessed                                     General Comments       Exercises     Shoulder Instructions      Home Living Family/patient expects to  be discharged to:: Private residence Living Arrangements: Spouse/significant other Available Help at Discharge: Family Type of Home: House Home Access: [place]: One level     Bathroom Shower/Tub: Occupational psychologist: [place]: Wheelchair - manual          Prior Functioning/Environment Level of Independence: Independent with assistive device(s)        Comments: Uses WC for mobility; works in Engineer, technical sales, still drives. Has had falls when transferring to [facility] since being sick.         OT Problem List: Decreased knowledge of use of DME or AE;Decreased knowledge of precautions;Decreased activity tolerance      OT Treatment/Interventions:      OT Goals(Current goals can be found in the care plan section) Acute Rehab OT Goals Patient Stated Goal: return home OT Goal Formulation: All assessment and education complete, DC therapy  OT  Frequency:     Barriers to D/C:            Co-evaluation              AM-PAC OT "6 Clicks" Daily Activity     Outcome Measure Help from another person eating meals?: A Little Help from another person taking care of personal grooming?: A Little Help from another person toileting, which includes using toliet, bedpan, or urinal?: A Little Help from another person bathing (including washing, rinsing, drying)?: A Little Help from another person to put on and taking off regular upper body clothing?: A Little Help from another person to put on and taking off regular lower body clothing?: A Little 6 Click Score: 18   End of Session Nurse Communication: Mobility status  Activity Tolerance: Patient tolerated treatment well Patient left: in bed;with call bell/phone within reach  OT Visit Diagnosis: Other abnormalities of gait and mobility (R26.89);Other symptoms and signs involving the nervous system (R29.898)                Time: 3903-0092 OT Time Calculation (min): 20 min Charges:  OT General Charges $OT Visit: 1 Visit OT Evaluation $OT Eval Moderate Complexity: [place], MSOT, OTR/L [facility] Office Number: [phone] Pager: [phone]  Zen[place]ia Jarred 04/21/2020, 5:26 PM

## 2020-04-21 NOTE — Progress Notes (Signed)
PT Cancellation Note  Patient Details Name: Thomas Schroeder MRN: [id number] DOB: [birth year]   Cancelled Treatment:    Reason Eval/Treat Not Completed: PT screened, no needs identified, will sign off HAd very lengthy conversation regarding assist at home and PLOF and pt reports he has all necessary assist at home. Reports no further needs. Will sign off. If needs change, please re-consult.   Reuel Derby, PT, DPT  Acute Rehabilitation Services  Pager: [phone] Office: [phone]    Rudean Hitt 04/21/2020, 4:14 PM

## 2020-04-21 NOTE — Plan of Care (Signed)

## 2020-04-21 NOTE — Progress Notes (Signed)
PROGRESS NOTE    Thomas Schroeder  BJY:[id number] DOB: Oct 25, 1960 DOA: 04/19/2020 PCP: Hali Marry, MD    Brief Narrative:  Thomas Schroeder is a 59 y.o. male with medical history significant of paraplegia, chronic indwelling Foley catheter, history of recurrent UTIs, hyperlipidemia, anxiety, alcohol use disorder presenting with complaints of fever and diarrhea.  Patient states for the past 3 days he is having fevers and diarrhea for the past 2 days.  He has had abdominal muscle spasms and slight cramping.  Denies recent antibiotic use.  He changes his indwelling Foley catheter on his own.  He last changed it 2 weeks ago.  He has been vaccinated against Covid.  Denies cough, shortness of breath, or chest pain.  In the ED, temperature 102.9 F.  Slightly tachycardic and tachypneic.  Not hypotensive.  Not hypoxic.  WBC count 14.5.  Lactic acid normal.  UA with positive nitrite, moderate amount of leukocytes, 21-50 WBCs, and many bacteria.  Blood culture x2 pending.  SARS-CoV-2 PCR negative. EDP consulted [facility] for admission for sepsis 2/2 UTI.   Assessment & Plan:   Principal Problem:   Catheter-associated urinary tract infection (HCC) Active Problems:   Diarrhea   Sepsis ([place])   Alcohol use   Anxiety   Sepsis, present on admission Catheter associated urinary tract infection Presenting to the ED with a temperature 102.9, tachycardic, tachypneic with a leukocytosis of 14.5.  Urinalysis with positive nitrite, moderate leukoesterase, 21-50 WBCs and many bacteria.  Patient with history of paraplegia and chronic indwelling Foley catheter and history of recurrent UTIs with previous cultures growing E. coli and Pseudomonas. --Continue ciprofloxacin 400 mg every 12 hours IV  Diarrhea: Resolved Denies any recent antibiotic use.  He is febrile with mild leukocytosis in the setting of UTI as above.  Now with formed stool.  Will discontinue GI PCR/C. difficile PCR panels and discontinue enteric  precautions.  Hyperlipidemia: Continue atorvastatin  Anxiety: Ativan as needed  EtOH use disorder: --CIWAA protocol with symptom triggered Ativan --Thiamine, folate, multivitamin   DVT prophylaxis: Lovenox Code Status: Full code Family Communication: No family present at bedside  Disposition Plan:  Status is: Inpatient  Remains inpatient appropriate because:Ongoing active pain requiring inpatient pain management, Ongoing diagnostic testing needed not appropriate for outpatient work up, Unsafe d/c plan and IV treatments appropriate due to intensity of illness or inability to take PO   Dispo: The patient is from: Home              Anticipated d/c is to: Home              Anticipated d/c date is: 1 day              Patient currently is not medically stable to d/c.   Consultants:   none  Procedures:   none  Antimicrobials:   Ciprofloxacin 8/5>>   Subjective: Patient seen and examined at bedside, resting comfortably.  Diarrhea resolved.  No fever past 24 hours.  Appetite improving.  No other specific complaints at this time.   Denies headache, no chills/night sweats, no nausea/vomiting, no chest pain, no palpitations, no abdominal pain.  No acute events overnight per nursing staff.  Objective: Vitals:   04/20/20 1100 04/20/20 1432 04/20/20 2120 04/21/20 0423  BP:  104/61 101/64 104/64  Pulse:  85 77 70  Resp:  17 18 16   Temp:  99.5 F (37.5 C) 97.9 F (36.6 C) (!) 97.3 F (36.3 C)  TempSrc:  Oral  Oral Oral  SpO2:  100% 96% 100%  Weight: 81.6 kg     Height: 5\' 6"  (1.676 m)       Intake/Output Summary (Last 24 hours) at 04/21/2020 1602 Last data filed at 04/21/2020 0428 Gross per 24 hour  Intake 290 ml  Output 1300 ml  Net -1010 ml   Filed Weights   04/20/20 1100  Weight: 81.6 kg    Examination:  General exam: Appears calm and comfortable  Respiratory system: Clear to auscultation. Respiratory effort normal.  Oxygenating well on room air Cardiovascular  system: S1 & S2 heard, RRR. No JVD, murmurs, rubs, gallops or clicks. No pedal edema. Gastrointestinal system: Abdomen is nondistended, soft and nontender. No organomegaly or masses felt. Normal bowel sounds heard. Central nervous system: Alert and oriented. No focal neurological deficits. Extremities: No peripheral edema, moves upper extremities independently, lower extremities muscle strength 0/5 Skin: No rashes, lesions or ulcers Psychiatry: Judgement and insight appear normal. Mood & affect appropriate.     Data Reviewed: I have personally reviewed following labs and imaging studies  CBC: Recent Labs  Lab 04/19/20 1753 04/20/20 0526 04/21/20 0914  WBC 14.5* 10.8* 7.8  NEUTROABS 12.7*  --   --   HGB 13.7 13.3 13.4  HCT 43.1 40.7 41.0  MCV 97.1 96.2 96.0  PLT 180 182 010   Basic Metabolic Panel: Recent Labs  Lab 04/19/20 1753 04/20/20 0526 04/21/20 0914  NA 134*  --  137  K 3.7  --  3.9  CL 100  --  104  CO2 24  --  23  GLUCOSE 167*  --  161*  BUN 8  --  9  CREATININE 1.17  --  0.92  CALCIUM 8.5*  --  8.5*  MG  --  2.1 2.0  PHOS  --  1.8*  --    GFR: Estimated Creatinine Clearance: 86.7 mL/min (by C-G formula based on SCr of 0.92 mg/dL). Liver Function Tests: Recent Labs  Lab 04/19/20 1753  AST 32  ALT 51*  ALKPHOS 86  BILITOT 1.1  PROT 6.8  ALBUMIN 3.4*   No results for input(s): LIPASE, AMYLASE in the last 168 hours. No results for input(s): AMMONIA in the last 168 hours. Coagulation Profile: Recent Labs  Lab 04/19/20 1753  INR 1.1   Cardiac Enzymes: No results for input(s): CKTOTAL, CKMB, CKMBINDEX, TROPONINI in the last 168 hours. BNP (last 3 results) No results for input(s): PROBNP in the last 8760 hours. HbA1C: No results for input(s): HGBA1C in the last 72 hours. CBG: No results for input(s): GLUCAP in the last 168 hours. Lipid Profile: No results for input(s): CHOL, HDL, LDLCALC, TRIG, CHOLHDL, LDLDIRECT in the last 72 hours. Thyroid  Function Tests: No results for input(s): TSH, T4TOTAL, FREET4, T3FREE, THYROIDAB in the last 72 hours. Anemia Panel: No results for input(s): VITAMINB12, FOLATE, FERRITIN, TIBC, IRON, RETICCTPCT in the last 72 hours. Sepsis Labs: Recent Labs  Lab 04/19/20 1753 04/20/20 0526  LATICACIDVEN 1.8 1.3    Recent Results (from the past 240 hour(s))  Culture, blood (Routine x 2)     Status: None (Preliminary result)   Collection Time: 04/19/20  5:53 PM   Specimen: BLOOD  Result Value Ref Range Status   Specimen Description BLOOD LEFT ANTECUBITAL  Final   Special Requests   Final    BOTTLES DRAWN AEROBIC AND ANAEROBIC Blood Culture adequate volume   Culture   Final    NO GROWTH 2 DAYS Performed at [facility]  [facility], [place] [address] [place], [place] [address]    Report Status PENDING  Incomplete  Culture, blood (Routine x 2)     Status: None (Preliminary result)   Collection Time: 04/19/20  6:02 PM   Specimen: BLOOD  Result Value Ref Range Status   Specimen Description BLOOD LEFT ANTECUBITAL  Final   Special Requests   Final    BOTTLES DRAWN AEROBIC AND ANAEROBIC Blood Culture adequate volume   Culture   Final    NO GROWTH 2 DAYS Performed at [facility], [place] [address] [place], [place] [address]    Report Status PENDING  Incomplete  SARS Coronavirus 2 by RT PCR (hospital order, performed in [facility] hospital lab) Nasopharyngeal Nasopharyngeal Swab     Status: None   Collection Time: 04/20/20  1:22 AM   Specimen: Nasopharyngeal Swab  Result Value Ref Range Status   SARS Coronavirus 2 NEGATIVE NEGATIVE Final    Comment: (NOTE) SARS-CoV-2 target nucleic acids are NOT DETECTED.  The SARS-CoV-2 RNA is generally detectable in upper and lower respiratory specimens during the acute phase of infection. The lowest concentration of SARS-CoV-2 viral copies this assay can detect is 250 copies / mL. A negative result does not preclude SARS-CoV-2 infection and should not be used  as the sole basis for treatment or other patient management decisions.  A negative result may occur with improper specimen collection / handling, submission of specimen other than nasopharyngeal swab, presence of viral mutation(s) within the areas targeted by this assay, and inadequate number of viral copies (<250 copies / mL). A negative result must be combined with clinical observations, patient history, and epidemiological information.  Fact Sheet for Patients:   StrictlyIdeas.no  Fact Sheet for Healthcare Providers: BankingDealers.co.za  This test is not yet approved or  cleared by the [place] FDA and has been authorized for detection and/or diagnosis of SARS-CoV-2 by FDA under an Emergency Use Authorization (EUA).  This EUA will remain in effect (meaning this test can be used) for the duration of the COVID-19 declaration under Section 564(b)(1) of the Act, 21 U.S.C. section 360bbb-3(b)(1), unless the authorization is terminated or revoked sooner.  Performed at [facility], [place] [address] [place], [place] [address]          Radiology Studies: DG Chest 2 View  Result Date: 04/19/2020 CLINICAL DATA:  Suspected sepsis EXAM: CHEST - 2 VIEW COMPARISON:  None FINDINGS: Trachea is midline. Cardiomediastinal contours and hilar structures are normal. Lungs are clear. No sign of pleural effusion. Visualized skeletal structures on limited assessment are unremarkable. IMPRESSION: No acute cardiopulmonary disease. Electronically Signed   By: Zetta Bills M.D.   On: 04/19/2020 18:17        Scheduled Meds: . aspirin  325 mg Oral Daily  . atorvastatin  10 mg Oral q1800  . Chlorhexidine Gluconate Cloth  6 each Topical Daily  . enoxaparin (LOVENOX) injection  40 mg Subcutaneous Q24H  . folic acid  1 mg Oral Daily  . LORazepam  0-4 mg Intravenous Q6H   Followed by  . [START ON 04/22/2020] LORazepam  0-4 mg Intravenous Q12H    . multivitamin with minerals  1 tablet Oral Daily  . thiamine  100 mg Oral Daily   Or  . thiamine  100 mg Intravenous Daily   Continuous Infusions: . ciprofloxacin 400 mg (04/21/20 0755)     LOS: 1 day    Time spent: 35 minutes spent on  chart review, discussion with nursing staff, consultants, updating family and interview/physical exam; more than 50% of that time was spent in counseling and/or coordination of care.    Aarion Kittrell J [place] DO [facility] Available via Epic secure chat 7am-7pm After these hours, please refer to coverage provider listed on [email or web address] 04/21/2020, 4:02 PM

## 2020-04-22 DIAGNOSIS — R6520 Severe sepsis without septic shock: Secondary | ICD-10-CM

## 2020-04-22 MED ORDER — CIPROFLOXACIN HCL 500 MG PO TABS
500.0000 mg | ORAL_TABLET | Freq: Two times a day (BID) | ORAL | 0 refills | Status: AC
Start: 2020-04-22 — End: 2020-05-11

## 2020-04-22 NOTE — Progress Notes (Signed)
Thomas Schroeder to be D/C'd  per MD order. Discussed with the patient and all questions fully answered.  VSS, Skin clean, dry and intact without evidence of skin break down, no evidence of skin tears noted.  IV catheter discontinued intact. Site without signs and symptoms of complications. Dressing and pressure applied.  An After Visit Summary was printed and given to the patient. Patient received prescription.  D/c education completed with patient/family including follow up instructions, medication list, d/c activities limitations if indicated, with other d/c instructions as indicated by MD - patient able to verbalize understanding, all questions fully answered.   Patient instructed to return to ED, call 911, or call MD for any changes in condition.   Patient to be escorted via [place], and D/C home via private auto.

## 2020-04-22 NOTE — Plan of Care (Signed)

## 2020-04-22 NOTE — Discharge Summary (Signed)
Physician Discharge Summary  Thomas Schroeder:[id number] DOB: 07-26-1961 DOA: 04/19/2020  PCP: Hali Marry, MD  Admit date: 04/19/2020 Discharge date: 04/22/2020  Admitted From: Home Disposition: Home    Recommendations for Outpatient Follow-up:  1. Follow up with PCP in 1-2 weeks 2. Continue antibiotics with ciprofloxacin for complicated UTI secondary to chronic indwelling Foley catheter 3. Recommend continued outpatient urology follow-up 4. Follow-up finalized blood cultures which showed no growth at 3 days at time of discharge.  Home Health: No Equipment/Devices: None  Discharge Condition: Stable CODE STATUS: Full code Diet recommendation: Heart healthy diet  History of present illness:  Thomas Schroeder is a 59 y.o.malewith medical history significant ofparaplegia,chronic indwelling Foley catheter, history of recurrent UTIs, hyperlipidemia, anxiety, alcohol use disorderpresenting with complaints of fever and diarrhea.Patient states for the past 3 days he is having fevers and diarrhea for the past 2 days. He has had abdominal muscle spasms and slight cramping. Denies recent antibiotic use. He changes his indwelling Foley catheter on his own. He last changed it 2 weeks ago. He has been vaccinated against Covid. Denies cough, shortness of breath, or chest pain.  In the ED, temperature 102.9 F. Slightly tachycardic and tachypneic. Not hypotensive. Not hypoxic. WBC count 14.5. Lactic acid normal. UA with positive nitrite, moderate amount of leukocytes, 21-50 WBCs, and many bacteria. Blood culture x2 pending. SARS-CoV-2 PCR negative. EDP consulted [facility] for admission for sepsis 2/2 UTI.  Hospital course:  Sepsis, present on admission Catheter associated urinary tract infection Presenting to the ED with a temperature 102.9, tachycardic, tachypneic with leukocytosis of 14.5.  Urinalysis with positive nitrite, moderate leukoesterase, 21-50 WBCs and many bacteria.   Patient with history of paraplegia and chronic indwelling Foley catheter and history of recurrent UTIs with previous cultures growing E. coli and Pseudomonas. Unfortunately no urine culture was performed at time of admission. Foley catheter was exchanged at time of admission. Patient's white blood cell count responded well to IV ciprofloxacin and was 7.8 at time of discharge. Will continue ciprofloxacin oral outpatient to complete antibiotic course for complicated UTI with chronic indwelling Foley catheter. Follow-up with PCP and urology outpatient.  Diarrhea: Resolved Denies any recent antibiotic use.  He is febrile with mild leukocytosis in the setting of UTI as above.  Now with formed stool.  Will discontinue GI PCR/C. difficile PCR panels and discontinue enteric precautions.  Hyperlipidemia: Continue atorvastatin  Anxiety: Ativan as needed  Cerumen impaction bilaterally: She reports difficulty hearing, ophthalmoscope exam noticed significant cerumen impaction bilaterally. Patient's ears bilaterally were irrigated with 50/50 solution of saline/peroxide with removal of cerumen.  Discharge Diagnoses:  Principal Problem:   Catheter-associated urinary tract infection ([place]) Active Problems:   Alcohol use   Anxiety    Discharge Instructions  Discharge Instructions    Call MD for:  difficulty breathing, headache or visual disturbances   Complete by: As directed    Call MD for:  extreme fatigue   Complete by: As directed    Call MD for:  persistant dizziness or light-headedness   Complete by: As directed    Call MD for:  persistant nausea and vomiting   Complete by: As directed    Call MD for:  severe uncontrolled pain   Complete by: As directed    Call MD for:  temperature >100.4   Complete by: As directed    Diet - low sodium heart healthy   Complete by: As directed    Increase activity slowly   Complete by:  As directed      Allergies as of 04/22/2020      Reactions    Bactrim Rash   Skin sloughing    Suprax [cefixime] Swelling   Ropinirole Other (See Comments)   Increased leg spasms/poor sleep.    Latex Rash      Medication List    TAKE these medications   aspirin 325 MG tablet Take 325 mg by mouth daily.   atorvastatin 20 MG tablet Commonly known as: LIPITOR Take 0.5 tablets (10 mg total) by mouth daily.   ciprofloxacin 500 MG tablet Commonly known as: Cipro Take 1 tablet (500 mg total) by mouth 2 (two) times daily for 19 days.   CRANBERRY EXTRACT PO Take 1 capsule by mouth daily.   diazepam 10 MG tablet Commonly known as: VALIUM Take 2 tablets (20 mg total) by mouth every 8 (eight) hours as needed. What changed: reasons to take this   Fish Oil 1000 MG Caps Take 1,000 mg by mouth daily.   MULTI VITAMIN MENS PO Take 1 tablet by mouth daily.       Follow-up Information    Hali Marry, MD. Schedule an appointment as soon as possible for a visit in 1 week(s).   Specialty: Family Medicine Contact information: [place] [place] [address] [phone]              Allergies  Allergen Reactions  . Bactrim Rash    Skin sloughing   . Suprax [Cefixime] Swelling  . Ropinirole Other (See Comments)    Increased leg spasms/poor sleep.   . Latex Rash    Consultations:  None   Procedures/Studies: DG Chest 2 View  Result Date: 04/19/2020 CLINICAL DATA:  Suspected sepsis EXAM: CHEST - 2 VIEW COMPARISON:  None FINDINGS: Trachea is midline. Cardiomediastinal contours and hilar structures are normal. Lungs are clear. No sign of pleural effusion. Visualized skeletal structures on limited assessment are unremarkable. IMPRESSION: No acute cardiopulmonary disease. Electronically Signed   By: Zetta Bills M.D.   On: 04/19/2020 18:17      Subjective: Patient seen and examined at bedside, resting comfortably. Patient states has issues with hearing related to cerumen impaction and requests  irrigation prior to discharge today. Otherwise doing well. Remains fever free. No other complaints or concerns at this time. Denies headache, no visual changes, no chest pain, palpitations, no shortness of breath, no abdominal pain. No acute events overnight per nursing staff.  Discharge Exam: Vitals:   04/21/20 2208 04/22/20 0559  BP: 117/79 99/69  Pulse: 78 67  Resp: 18 16  Temp: 97.8 F (36.6 C) 98.4 F (36.9 C)  SpO2: 98% 99%   Vitals:   04/20/20 2120 04/21/20 0423 04/21/20 2208 04/22/20 0559  BP: 101/64 104/64 117/79 99/69  Pulse: 77 70 78 67  Resp: 18 16 18 16   Temp: 97.9 F (36.6 C) (!) 97.3 F (36.3 C) 97.8 F (36.6 C) 98.4 F (36.9 C)  TempSrc: Oral Oral Oral Oral  SpO2: 96% 100% 98% 99%  Weight:      Height:        General: Pt is alert, awake, not in acute distress Cardiovascular: RRR, S1/S2 +, no rubs, no gallops Respiratory: CTA bilaterally, no wheezing, no rhonchi Abdominal: Soft, NT, ND, bowel sounds + Extremities: no edema, no cyanosis, moves upper extremities independently, lower extremities muscle strength 0/5    The results of significant diagnostics from this hospitalization (including imaging, microbiology, ancillary and laboratory)  are listed below for reference.     Microbiology: Recent Results (from the past 240 hour(s))  Culture, blood (Routine x 2)     Status: None (Preliminary result)   Collection Time: 04/19/20  5:53 PM   Specimen: BLOOD  Result Value Ref Range Status   Specimen Description BLOOD LEFT ANTECUBITAL  Final   Special Requests   Final    BOTTLES DRAWN AEROBIC AND ANAEROBIC Blood Culture adequate volume   Culture   Final    NO GROWTH 2 DAYS Performed at [facility], [address], [place], [place] [address]    Report Status PENDING  Incomplete  Culture, blood (Routine x 2)     Status: None (Preliminary result)   Collection Time: 04/19/20  6:02 PM   Specimen: BLOOD  Result Value Ref Range Status   Specimen  Description BLOOD LEFT ANTECUBITAL  Final   Special Requests   Final    BOTTLES DRAWN AEROBIC AND ANAEROBIC Blood Culture adequate volume   Culture   Final    NO GROWTH 2 DAYS Performed at [facility], [place] [address] [place], [place] [address]    Report Status PENDING  Incomplete  SARS Coronavirus 2 by RT PCR (hospital order, performed in [facility] hospital lab) Nasopharyngeal Nasopharyngeal Swab     Status: None   Collection Time: 04/20/20  1:22 AM   Specimen: Nasopharyngeal Swab  Result Value Ref Range Status   SARS Coronavirus 2 NEGATIVE NEGATIVE Final    Comment: (NOTE) SARS-CoV-2 target nucleic acids are NOT DETECTED.  The SARS-CoV-2 RNA is generally detectable in upper and lower respiratory specimens during the acute phase of infection. The lowest concentration of SARS-CoV-2 viral copies this assay can detect is 250 copies / mL. A negative result does not preclude SARS-CoV-2 infection and should not be used as the sole basis for treatment or other patient management decisions.  A negative result may occur with improper specimen collection / handling, submission of specimen other than nasopharyngeal swab, presence of viral mutation(s) within the areas targeted by this assay, and inadequate number of viral copies (<250 copies / mL). A negative result must be combined with clinical observations, patient history, and epidemiological information.  Fact Sheet for Patients:   StrictlyIdeas.no  Fact Sheet for Healthcare Providers: BankingDealers.co.za  This test is not yet approved or  cleared by the [place] FDA and has been authorized for detection and/or diagnosis of SARS-CoV-2 by FDA under an Emergency Use Authorization (EUA).  This EUA will remain in effect (meaning this test can be used) for the duration of the COVID-19 declaration under Section 564(b)(1) of the Act, 21 U.S.C. section 360bbb-3(b)(1), unless the  authorization is terminated or revoked sooner.  Performed at [facility], [place] [address], [place], [place] [address]      Labs: BNP (last 3 results) No results for input(s): BNP in the last 8760 hours. Basic Metabolic Panel: Recent Labs  Lab 04/19/20 1753 04/20/20 0526 04/21/20 0914  NA 134*  --  137  K 3.7  --  3.9  CL 100  --  104  CO2 24  --  23  GLUCOSE 167*  --  161*  BUN 8  --  9  CREATININE 1.17  --  0.92  CALCIUM 8.5*  --  8.5*  MG  --  2.1 2.0  PHOS  --  1.8*  --    Liver Function Tests: Recent Labs  Lab 04/19/20 1753  AST 32  ALT 51*  ALKPHOS 86  BILITOT 1.1  PROT 6.8  ALBUMIN 3.4*   No results for input(s): LIPASE, AMYLASE in the last 168 hours. No results for input(s): AMMONIA in the last 168 hours. CBC: Recent Labs  Lab 04/19/20 1753 04/20/20 0526 04/21/20 0914  WBC 14.5* 10.8* 7.8  NEUTROABS 12.7*  --   --   HGB 13.7 13.3 13.4  HCT 43.1 40.7 41.0  MCV 97.1 96.2 96.0  PLT 180 182 201   Cardiac Enzymes: No results for input(s): CKTOTAL, CKMB, CKMBINDEX, TROPONINI in the last 168 hours. BNP: Invalid input(s): POCBNP CBG: No results for input(s): GLUCAP in the last 168 hours. D-Dimer No results for input(s): DDIMER in the last 72 hours. Hgb A1c No results for input(s): HGBA1C in the last 72 hours. Lipid Profile No results for input(s): CHOL, HDL, LDLCALC, TRIG, CHOLHDL, LDLDIRECT in the last 72 hours. Thyroid function studies No results for input(s): TSH, T4TOTAL, T3FREE, THYROIDAB in the last 72 hours.  Invalid input(s): FREET3 Anemia work up No results for input(s): VITAMINB12, FOLATE, FERRITIN, TIBC, IRON, RETICCTPCT in the last 72 hours. Urinalysis    Component Value Date/Time   COLORURINE YELLOW 04/19/2020 2112   APPEARANCEUR CLOUDY (A) 04/19/2020 2112   LABSPEC 1.015 04/19/2020 2112   PHURINE 6.0 04/19/2020 2112   GLUCOSEU 100 (A) 04/19/2020 2112   HGBUR LARGE (A) 04/19/2020 2112   HGBUR large 06/19/2010 1630    BILIRUBINUR SMALL (A) 04/19/2020 2112   BILIRUBINUR Negative 08/27/2018 0920   KETONESUR NEGATIVE 04/19/2020 2112   PROTEINUR >300 (A) 04/19/2020 2112   UROBILINOGEN 0.2 08/27/2018 0920   UROBILINOGEN 0.2 06/19/2010 1630   NITRITE POSITIVE (A) 04/19/2020 2112   LEUKOCYTESUR MODERATE (A) 04/19/2020 2112   Sepsis Labs Invalid input(s): PROCALCITONIN,  WBC,  LACTICIDVEN Microbiology Recent Results (from the past 240 hour(s))  Culture, blood (Routine x 2)     Status: None (Preliminary result)   Collection Time: 04/19/20  5:53 PM   Specimen: BLOOD  Result Value Ref Range Status   Specimen Description BLOOD LEFT ANTECUBITAL  Final   Special Requests   Final    BOTTLES DRAWN AEROBIC AND ANAEROBIC Blood Culture adequate volume   Culture   Final    NO GROWTH 2 DAYS Performed at [facility], [address] [place], [place] [address]    Report Status PENDING  Incomplete  Culture, blood (Routine x 2)     Status: None (Preliminary result)   Collection Time: 04/19/20  6:02 PM   Specimen: BLOOD  Result Value Ref Range Status   Specimen Description BLOOD LEFT ANTECUBITAL  Final   Special Requests   Final    BOTTLES DRAWN AEROBIC AND ANAEROBIC Blood Culture adequate volume   Culture   Final    NO GROWTH 2 DAYS Performed at [facility], [place] [address], [place], [place] [address]    Report Status PENDING  Incomplete  SARS Coronavirus 2 by RT PCR (hospital order, performed in [facility] hospital lab) Nasopharyngeal Nasopharyngeal Swab     Status: None   Collection Time: 04/20/20  1:22 AM   Specimen: Nasopharyngeal Swab  Result Value Ref Range Status   SARS Coronavirus 2 NEGATIVE NEGATIVE Final    Comment: (NOTE) SARS-CoV-2 target nucleic acids are NOT DETECTED.  The SARS-CoV-2 RNA is generally detectable in upper and lower respiratory specimens during the acute phase of infection. The lowest concentration of SARS-CoV-2 viral copies this assay can detect  is 250 copies / mL.  A negative result does not preclude SARS-CoV-2 infection and should not be used as the sole basis for treatment or other patient management decisions.  A negative result may occur with improper specimen collection / handling, submission of specimen other than nasopharyngeal swab, presence of viral mutation(s) within the areas targeted by this assay, and inadequate number of viral copies (<250 copies / mL). A negative result must be combined with clinical observations, patient history, and epidemiological information.  Fact Sheet for Patients:   StrictlyIdeas.no  Fact Sheet for Healthcare Providers: BankingDealers.co.za  This test is not yet approved or  cleared by the [place] FDA and has been authorized for detection and/or diagnosis of SARS-CoV-2 by FDA under an Emergency Use Authorization (EUA).  This EUA will remain in effect (meaning this test can be used) for the duration of the COVID-19 declaration under Section 564(b)(1) of the Act, 21 U.S.C. section 360bbb-3(b)(1), unless the authorization is terminated or revoked sooner.  Performed at [facility], [place] [address], [place],  [address]      Time coordinating discharge: Over 30 minutes  SIGNED:   Leonilda Cozby J [place] DO  [facility] 04/22/2020, 8:29 AM

## 2020-04-22 NOTE — Discharge Instructions (Signed)
Earwax Buildup, Adult The ears produce a substance called earwax that helps keep bacteria out of the ear and protects the skin in the ear canal. Occasionally, earwax can build up in the ear and cause discomfort or hearing loss. What increases the risk? This condition is more likely to develop in people who:  Are male.  Are elderly.  Naturally produce more earwax.  Clean their ears often with cotton swabs.  Use earplugs often.  Use in-ear headphones often.  Wear hearing aids.  Have narrow ear canals.  Have earwax that is overly thick or sticky.  Have eczema.  Are dehydrated.  Have excess hair in the ear canal. What are the signs or symptoms? Symptoms of this condition include:  Reduced or muffled hearing.  A feeling of fullness in the ear or feeling that the ear is plugged.  Fluid coming from the ear.  Ear pain.  Ear itch.  Ringing in the ear.  Coughing.  An obvious piece of earwax that can be seen inside the ear canal. How is this diagnosed? This condition may be diagnosed based on:  Your symptoms.  Your medical history.  An ear exam. During the exam, your health care provider will look into your ear with an instrument called an otoscope. You may have tests, including a hearing test. How is this treated? This condition may be treated by:  Using ear drops to soften the earwax.  Having the earwax removed by a health care provider. The health care provider may: ? Flush the ear with water. ? Use an instrument that has a loop on the end (curette). ? Use a suction device.  Surgery to remove the wax buildup. This may be done in severe cases. Follow these instructions at home:   Take over-the-counter and prescription medicines only as told by your health care provider.  Do not put any objects, including cotton swabs, into your ear. You can clean the opening of your ear canal with a washcloth or facial tissue.  Follow instructions from your health care  provider about cleaning your ears. Do not over-clean your ears.  Drink enough fluid to keep your urine clear or pale yellow. This will help to thin the earwax.  Keep all follow-up visits as told by your health care provider. If earwax builds up in your ears often or if you use hearing aids, consider seeing your health care provider for routine, preventive ear cleanings. Ask your health care provider how often you should schedule your cleanings.  If you have hearing aids, clean them according to instructions from the manufacturer and your health care provider. Contact a health care provider if:  You have ear pain.  You develop a fever.  You have blood, pus, or other fluid coming from your ear.  You have hearing loss.  You have ringing in your ears that does not go away.  Your symptoms do not improve with treatment.  You feel like the room is spinning (vertigo). Summary  Earwax can build up in the ear and cause discomfort or hearing loss.  The most common symptoms of this condition include reduced or muffled hearing and a feeling of fullness in the ear or feeling that the ear is plugged.  This condition may be diagnosed based on your symptoms, your medical history, and an ear exam.  This condition may be treated by using ear drops to soften the earwax or by having the earwax removed by a health care provider.  Do not put any  objects, including cotton swabs, into your ear. You can clean the opening of your ear canal with a washcloth or facial tissue. This information is not intended to replace advice given to you by your health care provider. Make sure you discuss any questions you have with your health care provider. Document Revised: 08/15/2017 Document Reviewed: 11/13/2016 Elsevier Patient Education  2020 [employer]. Urinary Tract Infection, Adult  A urinary tract infection (UTI) is an infection of any part of the urinary tract. The urinary tract includes the kidneys,  ureters, bladder, and urethra. These organs make, store, and get rid of urine in the body. Your health care provider may use other names to describe the infection. An upper UTI affects the ureters and kidneys (pyelonephritis). A lower UTI affects the bladder (cystitis) and urethra (urethritis). What are the causes? Most urinary tract infections are caused by bacteria in your genital area, around the entrance to your urinary tract (urethra). These bacteria grow and cause inflammation of your urinary tract. What increases the risk? You are more likely to develop this condition if:  You have a urinary catheter that stays in place (indwelling).  You are not able to control when you urinate or have a bowel movement (you have incontinence).  You are male and you: ? Use a spermicide or diaphragm for birth control. ? Have low estrogen levels. ? Are pregnant.  You have certain genes that increase your risk (genetics).  You are sexually active.  You take antibiotic medicines.  You have a condition that causes your flow of urine to slow down, such as: ? An enlarged prostate, if you are male. ? Blockage in your urethra (stricture). ? A kidney stone. ? A nerve condition that affects your bladder control (neurogenic bladder). ? Not getting enough to drink, or not urinating often.  You have certain medical conditions, such as: ? Diabetes. ? A weak disease-fighting system (immunesystem). ? Sickle cell disease. ? Gout. ? Spinal cord injury. What are the signs or symptoms? Symptoms of this condition include:  Needing to urinate right away (urgently).  Frequent urination or passing small amounts of urine frequently.  Pain or burning with urination.  Blood in the urine.  Urine that smells bad or unusual.  Trouble urinating.  Cloudy urine.  Vaginal discharge, if you are male.  Pain in the abdomen or the lower back. You may also have:  Vomiting or a decreased  appetite.  Confusion.  Irritability or tiredness.  A fever.  Diarrhea. The first symptom in older adults may be confusion. In some cases, they may not have any symptoms until the infection has worsened. How is this diagnosed? This condition is diagnosed based on your medical history and a physical exam. You may also have other tests, including:  Urine tests.  Blood tests.  Tests for sexually transmitted infections (STIs). If you have had more than one UTI, a cystoscopy or imaging studies may be done to determine the cause of the infections. How is this treated? Treatment for this condition includes:  Antibiotic medicine.  Over-the-counter medicines to treat discomfort.  Drinking enough water to stay hydrated. If you have frequent infections or have other conditions such as a kidney stone, you may need to see a health care provider who specializes in the urinary tract (urologist). In rare cases, urinary tract infections can cause sepsis. Sepsis is a life-threatening condition that occurs when the body responds to an infection. Sepsis is treated in the hospital with IV antibiotics, fluids, and  other medicines. Follow these instructions at home:  Medicines  Take over-the-counter and prescription medicines only as told by your health care provider.  If you were prescribed an antibiotic medicine, take it as told by your health care provider. Do not stop using the antibiotic even if you start to feel better. General instructions  Make sure you: ? Empty your bladder often and completely. Do not hold urine for long periods of time. ? Empty your bladder after sex. ? Wipe from front to back after a bowel movement if you are male. Use each tissue one time when you wipe.  Drink enough fluid to keep your urine pale yellow.  Keep all follow-up visits as told by your health care provider. This is important. Contact a health care provider if:  Your symptoms do not get better after  1-2 days.  Your symptoms go away and then return. Get help right away if you have:  Severe pain in your back or your lower abdomen.  A fever.  Nausea or vomiting. Summary  A urinary tract infection (UTI) is an infection of any part of the urinary tract, which includes the kidneys, ureters, bladder, and urethra.  Most urinary tract infections are caused by bacteria in your genital area, around the entrance to your urinary tract (urethra).  Treatment for this condition often includes antibiotic medicines.  If you were prescribed an antibiotic medicine, take it as told by your health care provider. Do not stop using the antibiotic even if you start to feel better.  Keep all follow-up visits as told by your health care provider. This is important. This information is not intended to replace advice given to you by your health care provider. Make sure you discuss any questions you have with your health care provider. Document Revised: 08/20/2018 Document Reviewed: 03/12/2018 Elsevier Patient Education  2020 [employer].

## 2020-04-24 LAB — CULTURE, BLOOD (ROUTINE X 2)
Culture: NO GROWTH
Culture: NO GROWTH
Special Requests: ADEQUATE
Special Requests: ADEQUATE

## 2020-04-27 ENCOUNTER — Encounter: Payer: Self-pay | Admitting: Family Medicine

## 2020-05-03 NOTE — Telephone Encounter (Signed)
Letter printed and signed. Please see if we would be able to scan and send the patient via [facility].

## 2020-05-09 ENCOUNTER — Encounter: Payer: Self-pay | Admitting: Family Medicine

## 2020-05-09 ENCOUNTER — Telehealth (INDEPENDENT_AMBULATORY_CARE_PROVIDER_SITE_OTHER): Payer: BC Managed Care – PPO | Admitting: Family Medicine

## 2020-05-09 VITALS — Temp 96.9°F

## 2020-05-09 DIAGNOSIS — N390 Urinary tract infection, site not specified: Secondary | ICD-10-CM

## 2020-05-09 DIAGNOSIS — T83512A Infection and inflammatory reaction due to nephrostomy catheter, initial encounter: Secondary | ICD-10-CM

## 2020-05-09 DIAGNOSIS — E785 Hyperlipidemia, unspecified: Secondary | ICD-10-CM

## 2020-05-09 NOTE — Assessment & Plan Note (Signed)
Very high risk for cardiovascular disease with a 10-year risk of 11%.  So far tolerating 10 mg of atorvastatin well.  He did have to move it to morning time he is now trying to 20 mg.  If he is able to tolerated a log to recheck his lipids in about 2 to 3 months.

## 2020-05-09 NOTE — Progress Notes (Signed)
Virtual Visit via Video Note  I connected with Thomas Schroeder on 05/09/20 at 11:30 AM EDT by a video enabled telemedicine application and verified that I am speaking with the correct person using two identifiers.   I discussed the limitations of evaluation and management by telemedicine and the availability of in person appointments. The patient expressed understanding and agreed to proceed.  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  After 3 days of fever he went to the ED and was admitted.  Started having diarrhea 2-3 day he was d/c home on Cipro and will finish ABX by Thursday.  Blood culture were neg. He is feeling much better.  sxs have resolved.  Urine culture wasn't obtained.   Pt reports that he is doing well at this point. He has about 1-2 days left on his ABX. He hasn't had anymore problems with diarrhea.   Pt stated that he was trying to find out when the last treated for an actual UTI. He basically wanted to know what would be the protocol for treatment whether there is a fever or not.   When was the last time he was tx by pcp. With ABX  And what is would be the protocol for tx in the future since he has an indwelling catheter .   F/U hyperlipidemia -so far has been tolerating 10 mg of atorvastatin.  When it first started it at bedtime it was causing a lot of leg movements which was then causing significant disruption to his sleep quality he moved to the morning and has been taking that for a couple of months now and so far has actually been doing pretty well with it.  He actually just increased it to 20 mg about 2 days ago so he has been getting that a try so far so good    Past medical history, Surgical history, Family history not pertinant except as noted below, Social history, Allergies, and medications have been  entered into the medical record, reviewed, and corrections made.   Review of Systems: No fevers, chills, night sweats, weight loss, chest pain, or shortness of breath.   Objective:    General: Speaking clearly in complete sentences without any shortness of breath.  Alert and oriented x3.  Normal judgment. No apparent acute distress.    Impression and Recommendations:    Hyperlipidemia Very high risk for cardiovascular disease with a 10-year risk of 11%.  So far tolerating 10 mg of atorvastatin well.  He did have to move it to morning time he is now trying to 20 mg.  If he is able to tolerated a log to recheck his lipids in about 2 to 3 months.  Catheter associated UTI - completed ABX.  Sxs resolved.  We did discuss that strategy around future UTIs last when he had was in December 2019.  Last couple times has had UTIs they have been positive for E. coli.  We discussed that he is likely colonized and so unfortunately for him the only definitive way to know would be for him to have a fever especially lacking additional symptoms and a positive culture at that point.  If we are able to get him in pretty quickly we may be able to treat him as an outpatient with oral and maybe even IM Rocephin with getting blood work and  cultures etc. at that appointment.  But for some reason he worked to get worse or not be able to get in pretty promptly then in the future he would need to go back to the emergency department.  Diarrhea - resolved.     Time spent in encounter 31 minutes including reviewed hospital records.    I discussed the assessment and treatment plan with the patient. The patient was provided an opportunity to ask questions and all were answered. The patient agreed with the plan and demonstrated an understanding of the instructions.   The patient was advised to call back or seek an in-person evaluation if the symptoms worsen or if the condition fails to improve as anticipated.   [name], MD

## 2020-05-09 NOTE — Progress Notes (Signed)
Pt was admitted on 8/4 discharged on 04/22/2020.  Pt reports that he is doing well at this point. He has about 1-2 days left on his ABX. He hasn't had anymore problems with diarrhea.   Pt stated that he was trying to find out when the last treated for an actual UTI. He basically wanted to know what would be the protocol for treatment whether there is a fever or not.   When was the last time he was tx by pcp. With ABX  And what is would be the protocol for tx in the future since he has an indwelling catheter .

## 2020-05-29 ENCOUNTER — Ambulatory Visit: Payer: BC Managed Care – PPO | Admitting: Family Medicine

## 2020-06-07 ENCOUNTER — Ambulatory Visit (INDEPENDENT_AMBULATORY_CARE_PROVIDER_SITE_OTHER): Payer: BC Managed Care – PPO | Admitting: Family Medicine

## 2020-06-07 ENCOUNTER — Other Ambulatory Visit: Payer: Self-pay

## 2020-06-07 ENCOUNTER — Encounter: Payer: Self-pay | Admitting: Family Medicine

## 2020-06-07 VITALS — BP 140/83 | HR 85 | Temp 97.7°F

## 2020-06-07 DIAGNOSIS — R030 Elevated blood-pressure reading, without diagnosis of hypertension: Secondary | ICD-10-CM

## 2020-06-07 DIAGNOSIS — Z789 Other specified health status: Secondary | ICD-10-CM

## 2020-06-07 DIAGNOSIS — Z23 Encounter for immunization: Secondary | ICD-10-CM

## 2020-06-07 DIAGNOSIS — M62838 Other muscle spasm: Secondary | ICD-10-CM | POA: Diagnosis not present

## 2020-06-07 DIAGNOSIS — E785 Hyperlipidemia, unspecified: Secondary | ICD-10-CM | POA: Diagnosis not present

## 2020-06-07 DIAGNOSIS — Z7289 Other problems related to lifestyle: Secondary | ICD-10-CM

## 2020-06-07 LAB — HEPATIC FUNCTION PANEL
AG Ratio: 2 (calc) (ref 1.0–2.5)
ALT: 40 U/L (ref 9–46)
AST: 23 U/L (ref 10–35)
Albumin: 4.6 g/dL (ref 3.6–5.1)
Alkaline phosphatase (APISO): 89 U/L (ref 35–144)
Bilirubin, Direct: 0.1 mg/dL (ref 0.0–0.2)
Globulin: 2.3 g/dL (calc) (ref 1.9–3.7)
Indirect Bilirubin: 0.5 mg/dL (calc) (ref 0.2–1.2)
Total Bilirubin: 0.6 mg/dL (ref 0.2–1.2)
Total Protein: 6.9 g/dL (ref 6.1–8.1)

## 2020-06-07 LAB — LIPID PANEL
Cholesterol: 147 mg/dL (ref <200)
HDL: 36 mg/dL — ABNORMAL LOW (ref >=40)
LDL Cholesterol (Calc): 80 mg/dL (calc)
Non-HDL Cholesterol (Calc): 111 mg/dL (calc) (ref <130)
Total CHOL/HDL Ratio: 4.1 (calc) (ref <5.0)
Triglycerides: 217 mg/dL — ABNORMAL HIGH (ref <150)

## 2020-06-07 MED ORDER — ATORVASTATIN CALCIUM 20 MG PO TABS: 10.0000 mg | ORAL_TABLET | Freq: Every day | ORAL | 1 refills | Status: DC

## 2020-06-07 MED ORDER — ATORVASTATIN CALCIUM 20 MG PO TABS: 20.0000 mg | ORAL_TABLET | Freq: Every day | ORAL | 0 refills | Status: DC

## 2020-06-07 NOTE — Assessment & Plan Note (Signed)
Using diazepam as needed. Sometimes only uses it twice a day but up to 4 times a day depending on muscle tone. Did remind him that he really needs to avoid drinking alcohol with this medication.

## 2020-06-07 NOTE — Assessment & Plan Note (Signed)
Did encourage him to cut back on intake of beer. Maybe cutting out beer during the workweek and decreasing the amount on the weekends. So warned him about mixing it with the diazepam. He says he usually will skip taking the medication if he drinks.

## 2020-06-07 NOTE — Progress Notes (Signed)
Established Patient Office Visit  Subjective:  Patient ID: Thomas Schroeder, male    DOB: December 30, 1960  Age: 59 y.o. MRN: [id number]  CC:  Chief Complaint  Patient presents with  . Follow-up    HPI [name] presents for paraplegia with muscle spasms.  He is on Valium 3 times daily.   F/U hyperlipidemia - we inc his atorvastatin to 20mg  instead of 10mg . He also reports that he is cognitively gained about 15 pounds this past year for Covid has been working for home from the last year and a half and says since then has probably been eating a little bit bigger portions though the food choices themselves has not changed. And has been drinking a little bit more beer than he usually does  Muscle spasms-overall doing okay. Uses diazepam 2-4 times a day as needed.   Past Medical History:  Diagnosis Date  . Allergy   . Chronic indwelling Foley catheter   . Chronic kidney disease   . ED (erectile dysfunction)   . History of recurrent UTIs   . Night muscle spasms   . Paraplegia [facility])     Past Surgical History:  Procedure Laterality Date  . APPENDECTOMY    . FEMUR FRACTURE SURGERY    . kidney stone removal    . RT tibia fracture    . T8 T9 fusion     . TONSILLECTOMY      Family History  Problem Relation Age of Onset  . Hyperlipidemia Mother   . Diabetes Mother   . Rosacea Mother   . Hypertension Neg Hx   . Lung cancer Mother        smoker    Social History   Socioeconomic History  . Marital status: Soil scientist    Spouse name: Not on file  . Number of children: 2  . Years of education: Not on file  . Highest education level: Not on file  Occupational History    Employer: [employer]  Tobacco Use  . Smoking status: Never Smoker  . Smokeless tobacco: Never Used  Substance and Sexual Activity  . Alcohol use: Yes    Alcohol/week: 10.0 standard drinks    Types: 10 Standard drinks or equivalent per week    Comment: per week  . Drug use: Not on file  . Sexual  activity: Yes    Partners: Female  Other Topics Concern  . Not on file  Social History Narrative   1 caffeine drinks per day.  Some exercise.    Social Determinants of Health   Financial Resource Strain:   . Difficulty of Paying Living Expenses: Not on file  Food Insecurity:   . Worried About Charity fundraiser in the Last Year: Not on file  . Ran Out of Food in the Last Year: Not on file  Transportation Needs:   . Lack of Transportation (Medical): Not on file  . Lack of Transportation (Non-Medical): Not on file  Physical Activity:   . Days of Exercise per Week: Not on file  . Minutes of Exercise per Session: Not on file  Stress:   . Feeling of Stress : Not on file  Social Connections:   . Frequency of Communication with Friends and Family: Not on file  . Frequency of Social Gatherings with Friends and Family: Not on file  . Attends Religious Services: Not on file  . Active Member of Clubs or Organizations: Not on file  . Attends Club or  Organization Meetings: Not on file  . Marital Status: Not on file  Intimate Partner Violence:   . Fear of Current or Ex-Partner: Not on file  . Emotionally Abused: Not on file  . Physically Abused: Not on file  . Sexually Abused: Not on file    Outpatient Medications Prior to Visit  Medication Sig Dispense Refill  . aspirin 325 MG tablet Take 325 mg by mouth daily.      Marland Kitchen CRANBERRY EXTRACT PO Take 1 capsule by mouth daily.    . diazepam (VALIUM) 10 MG tablet Take 2 tablets (20 mg total) by mouth every 8 (eight) hours as needed. (Patient taking differently: Take 20 mg by mouth every 8 (eight) hours as needed for anxiety. ) 540 tablet 1  . Multiple Vitamin (MULTI VITAMIN MENS PO) Take 1 tablet by mouth daily.     . Omega-3 Fatty Acids (FISH OIL) 1000 MG CAPS Take 1,000 mg by mouth daily.     Marland Kitchen atorvastatin (LIPITOR) 20 MG tablet Take 0.5 tablets (10 mg total) by mouth daily. 90 tablet 1   No facility-administered medications prior to  visit.    Allergies  Allergen Reactions  . Bactrim Rash    Skin sloughing   . Ropinirole Other (See Comments)    Increased leg spasms/poor sleep.   . Latex Rash    ROS Review of Systems    Objective:    Physical Exam Constitutional:      Appearance: He is well-developed.  HENT:     Head: Normocephalic and atraumatic.  Cardiovascular:     Rate and Rhythm: Normal rate and regular rhythm.     Heart sounds: Normal heart sounds.  Pulmonary:     Effort: Pulmonary effort is normal.     Breath sounds: Normal breath sounds.  Skin:    General: Skin is warm and dry.  Neurological:     Mental Status: He is alert and oriented to person, place, and time.  Psychiatric:        Behavior: Behavior normal.     BP 140/83 (BP Location: Left Arm, Patient Position: Sitting, Cuff Size: Normal)   Pulse 85   Temp 97.7 F (36.5 C) (Oral)  Wt Readings from Last 3 Encounters:  04/20/20 180 lb (81.6 kg)  06/16/15 180 lb (81.6 kg)  08/04/14 180 lb (81.6 kg)     There are no preventive care reminders to display for this patient.  There are no preventive care reminders to display for this patient.  Lab Results  Component Value Date   TSH 1.899 03/28/2009   Lab Results  Component Value Date   WBC 7.8 04/21/2020   HGB 13.4 04/21/2020   HCT 41.0 04/21/2020   MCV 96.0 04/21/2020   PLT 201 04/21/2020   Lab Results  Component Value Date   NA 137 04/21/2020   K 3.9 04/21/2020   CO2 23 04/21/2020   GLUCOSE 161 (H) 04/21/2020   BUN 9 04/21/2020   CREATININE 0.92 04/21/2020   BILITOT 1.1 04/19/2020   ALKPHOS 86 04/19/2020   AST 32 04/19/2020   ALT 51 (H) 04/19/2020   PROT 6.8 04/19/2020   ALBUMIN 3.4 (L) 04/19/2020   CALCIUM 8.5 (L) 04/21/2020   ANIONGAP 10 04/21/2020   Lab Results  Component Value Date   CHOL 201 (H) 11/25/2019   Lab Results  Component Value Date   HDL 35 (L) 11/25/2019   Lab Results  Component Value Date   LDLCALC 135 (H) 11/25/2019  Lab Results   Component Value Date   TRIG 178 (H) 11/25/2019   Lab Results  Component Value Date   CHOLHDL 5.7 (H) 11/25/2019   Lab Results  Component Value Date   HGBA1C 5.5 11/25/2019      Assessment & Plan:   Problem List Items Addressed This Visit      Other   Muscle spasticity    Using diazepam as needed. Sometimes only uses it twice a day but up to 4 times a day depending on muscle tone. Did remind him that he really needs to avoid drinking alcohol with this medication.      Hyperlipidemia    Tolerating a whole tab of the atorvastatin well so far he said the first week he felt like he had to come to make an adjustment but is now doing great. Due to repeat lipids and liver enzymes.      Relevant Medications   atorvastatin (LIPITOR) 20 MG tablet   Other Relevant Orders   Hepatic function panel   Lipid panel   Alcohol use    Did encourage him to cut back on intake of beer. Maybe cutting out beer during the workweek and decreasing the amount on the weekends. So warned him about mixing it with the diazepam. He says he usually will skip taking the medication if he drinks.       Other Visit Diagnoses    Need for influenza vaccination    -  Primary   Relevant Orders   Flu Vaccine QUAD 6+ mos PF IM (Fluarix Quad PF) (Completed)   Elevated BP without diagnosis of hypertension         Elevated blood pressure without diagnosis of hypertension. His wife is a Marine scientist and asked him to have his wife check his blood pressure couple times over the next week or 2 to make sure that it is better controlled at home.  Meds ordered this encounter  Medications  . DISCONTD: atorvastatin (LIPITOR) 20 MG tablet    Sig: Take 0.5 tablets (10 mg total) by mouth daily.    Dispense:  90 tablet    Refill:  1  . atorvastatin (LIPITOR) 20 MG tablet    Sig: Take 1 tablet (20 mg total) by mouth daily.    Dispense:  1 tablet    Refill:  0    Follow-up: Return in about 6 months (around 12/05/2020) for  Wellness Exam.    [name], MD

## 2020-06-07 NOTE — Assessment & Plan Note (Signed)
Tolerating a whole tab of the atorvastatin well so far he said the first week he felt like he had to come to make an adjustment but is now doing great. Due to repeat lipids and liver enzymes.

## 2020-06-08 ENCOUNTER — Encounter: Payer: Self-pay | Admitting: Family Medicine

## 2020-06-23 ENCOUNTER — Other Ambulatory Visit: Payer: Self-pay

## 2020-06-23 ENCOUNTER — Emergency Department (HOSPITAL_COMMUNITY): Payer: BC Managed Care – PPO

## 2020-06-23 ENCOUNTER — Encounter (HOSPITAL_COMMUNITY): Payer: Self-pay | Admitting: Emergency Medicine

## 2020-06-23 ENCOUNTER — Inpatient Hospital Stay (HOSPITAL_COMMUNITY)
Admission: EM | Admit: 2020-06-23 | Discharge: 2020-06-25 | DRG: 481 | Disposition: A | Discharge status: Home / Self Care | Payer: BC Managed Care – PPO | Attending: Orthopaedic Surgery | Admitting: Orthopaedic Surgery

## 2020-06-23 DIAGNOSIS — S72002A Fracture of unspecified part of neck of left femur, initial encounter for closed fracture: Secondary | ICD-10-CM

## 2020-06-23 DIAGNOSIS — Z20822 Contact with and (suspected) exposure to covid-19: Secondary | ICD-10-CM | POA: Diagnosis present

## 2020-06-23 DIAGNOSIS — M1612 Unilateral primary osteoarthritis, left hip: Secondary | ICD-10-CM | POA: Diagnosis not present

## 2020-06-23 DIAGNOSIS — Z87440 Personal history of urinary (tract) infections: Secondary | ICD-10-CM | POA: Diagnosis not present

## 2020-06-23 DIAGNOSIS — Z7982 Long term (current) use of aspirin: Secondary | ICD-10-CM

## 2020-06-23 DIAGNOSIS — N529 Male erectile dysfunction, unspecified: Secondary | ICD-10-CM | POA: Diagnosis present

## 2020-06-23 DIAGNOSIS — E785 Hyperlipidemia, unspecified: Secondary | ICD-10-CM | POA: Diagnosis present

## 2020-06-23 DIAGNOSIS — G8220 Paraplegia, unspecified: Secondary | ICD-10-CM | POA: Diagnosis present

## 2020-06-23 DIAGNOSIS — F419 Anxiety disorder, unspecified: Secondary | ICD-10-CM | POA: Diagnosis not present

## 2020-06-23 DIAGNOSIS — S72142A Displaced intertrochanteric fracture of left femur, initial encounter for closed fracture: Principal | ICD-10-CM

## 2020-06-23 DIAGNOSIS — W1830XA Fall on same level, unspecified, initial encounter: Secondary | ICD-10-CM | POA: Diagnosis present

## 2020-06-23 DIAGNOSIS — Z981 Arthrodesis status: Secondary | ICD-10-CM | POA: Diagnosis not present

## 2020-06-23 DIAGNOSIS — F319 Bipolar disorder, unspecified: Secondary | ICD-10-CM | POA: Diagnosis present

## 2020-06-23 DIAGNOSIS — M8580 Other specified disorders of bone density and structure, unspecified site: Secondary | ICD-10-CM | POA: Diagnosis not present

## 2020-06-23 DIAGNOSIS — Z888 Allergy status to other drugs, medicaments and biological substances status: Secondary | ICD-10-CM

## 2020-06-23 DIAGNOSIS — Z87442 Personal history of urinary calculi: Secondary | ICD-10-CM

## 2020-06-23 DIAGNOSIS — I959 Hypotension, unspecified: Secondary | ICD-10-CM | POA: Diagnosis present

## 2020-06-23 DIAGNOSIS — Z91040 Latex allergy status: Secondary | ICD-10-CM

## 2020-06-23 DIAGNOSIS — S72009A Fracture of unspecified part of neck of unspecified femur, initial encounter for closed fracture: Secondary | ICD-10-CM | POA: Diagnosis present

## 2020-06-23 DIAGNOSIS — S72141D Displaced intertrochanteric fracture of right femur, subsequent encounter for closed fracture with routine healing: Secondary | ICD-10-CM | POA: Diagnosis not present

## 2020-06-23 DIAGNOSIS — Z79899 Other long term (current) drug therapy: Secondary | ICD-10-CM

## 2020-06-23 DIAGNOSIS — Z83438 Family history of other disorder of lipoprotein metabolism and other lipidemia: Secondary | ICD-10-CM | POA: Diagnosis not present

## 2020-06-23 DIAGNOSIS — Z419 Encounter for procedure for purposes other than remedying health state, unspecified: Secondary | ICD-10-CM

## 2020-06-23 LAB — BASIC METABOLIC PANEL
Anion gap: 10 (ref 5–15)
BUN: 20 mg/dL (ref 6–20)
CO2: 23 mmol/L (ref 22–32)
Calcium: 9.2 mg/dL (ref 8.9–10.3)
Chloride: 103 mmol/L (ref 98–111)
Creatinine, Ser: 1 mg/dL (ref 0.61–1.24)
GFR, Estimated: >60 mL/min (ref >60)
Glucose, Bld: 109 mg/dL — ABNORMAL HIGH (ref 70–99)
Potassium: 4.1 mmol/L (ref 3.5–5.1)
Sodium: 136 mmol/L (ref 135–145)

## 2020-06-23 LAB — SURGICAL PCR SCREEN
MRSA, PCR: NEGATIVE
Staphylococcus aureus: NEGATIVE

## 2020-06-23 LAB — CBC WITH DIFFERENTIAL/PLATELET
Abs Immature Granulocytes: 0.05 10*3/uL (ref 0.00–0.07)
Basophils Absolute: 0.1 10*3/uL (ref 0.0–0.1)
Basophils Relative: 1 %
Eosinophils Absolute: 0.1 10*3/uL (ref 0.0–0.5)
Eosinophils Relative: 1 %
HCT: 45 % (ref 39.0–52.0)
Hemoglobin: 15.2 g/dL (ref 13.0–17.0)
Immature Granulocytes: 0 %
Lymphocytes Relative: 7 %
Lymphs Abs: 0.8 10*3/uL (ref 0.7–4.0)
MCH: 31.9 pg (ref 26.0–34.0)
MCHC: 33.8 g/dL (ref 30.0–36.0)
MCV: 94.5 fL (ref 80.0–100.0)
Monocytes Absolute: 1 10*3/uL (ref 0.1–1.0)
Monocytes Relative: 9 %
Neutro Abs: 9.2 10*3/uL — ABNORMAL HIGH (ref 1.7–7.7)
Neutrophils Relative %: 82 %
Platelets: 245 10*3/uL (ref 150–400)
RBC: 4.76 MIL/uL (ref 4.22–5.81)
RDW: 12.9 % (ref 11.5–15.5)
WBC: 11.2 10*3/uL — ABNORMAL HIGH (ref 4.0–10.5)
nRBC: 0 % (ref 0.0–0.2)

## 2020-06-23 LAB — RESPIRATORY PANEL BY RT PCR (FLU A&B, COVID)
Influenza A by PCR: NEGATIVE
Influenza B by PCR: NEGATIVE
SARS Coronavirus 2 by RT PCR: NEGATIVE

## 2020-06-23 MED ORDER — SODIUM CHLORIDE 0.9 % IV SOLN: INTRAVENOUS | Status: DC

## 2020-06-23 MED ORDER — MORPHINE SULFATE (PF) 2 MG/ML IV SOLN: 0.5000 mg | INTRAVENOUS | Status: DC | PRN

## 2020-06-23 MED ORDER — METHOCARBAMOL 500 MG PO TABS: 500.0000 mg | ORAL_TABLET | Freq: Four times a day (QID) | ORAL | Status: DC | PRN

## 2020-06-23 MED ORDER — DIAZEPAM 5 MG PO TABS: 20.0000 mg | ORAL_TABLET | Freq: Three times a day (TID) | ORAL | Status: DC | PRN

## 2020-06-23 MED ORDER — ATORVASTATIN CALCIUM 20 MG PO TABS
20.0000 mg | ORAL_TABLET | Freq: Every day | ORAL | Status: DC
  Filled 2020-06-23 (×2): qty 1

## 2020-06-23 MED ORDER — DIPHENHYDRAMINE HCL 12.5 MG/5ML PO ELIX: 12.5000 mg | ORAL_SOLUTION | ORAL | Status: DC | PRN

## 2020-06-23 MED ORDER — CHLORHEXIDINE GLUCONATE CLOTH 2 % EX PADS
6.0000 | MEDICATED_PAD | Freq: Every day | CUTANEOUS | Status: DC
  Administered 2020-06-23 – 2020-06-25 (×2): 6 via TOPICAL

## 2020-06-23 MED ORDER — METHOCARBAMOL 1000 MG/10ML IJ SOLN
500.0000 mg | Freq: Four times a day (QID) | INTRAVENOUS | Status: DC | PRN
  Filled 2020-06-23: qty 5

## 2020-06-23 MED ORDER — HYDROCODONE-ACETAMINOPHEN 7.5-325 MG PO TABS: 1.0000 | ORAL_TABLET | ORAL | Status: DC | PRN

## 2020-06-23 MED ORDER — ACETAMINOPHEN 325 MG PO TABS: 325.0000 mg | ORAL_TABLET | Freq: Four times a day (QID) | ORAL | Status: DC | PRN

## 2020-06-23 MED ORDER — HYDROCODONE-ACETAMINOPHEN 5-325 MG PO TABS
1.0000 | ORAL_TABLET | ORAL | Status: DC | PRN
  Administered 2020-06-25: 2 via ORAL
  Filled 2020-06-23: qty 2

## 2020-06-23 NOTE — H&P (Signed)
Thomas Schroeder is an 59 y.o. male.   Chief Complaint:   Left lower extremity spasms with known hip fracture HPI: The patient is a 59 year old gentleman who is actually a T8 paraplegic.  He is very active and sustained a hard mechanical fall a week ago today.  He had actually felt okay with only some pain and spasming after the fall and was doing well.  He dropped down hard in his wheelchair today and felt a pop in the hip and then had significant spasming.  He was taken to the [facility] emergency room and found to have a displaced intertrochanteric proximal femur fracture of the left hip.  Orthopedic surgery was consulted to address this injury.  He says it is causing worsening spasms today.  Past Medical History:  Diagnosis Date  . Allergy   . Chronic indwelling Foley catheter   . Chronic kidney disease   . ED (erectile dysfunction)   . History of recurrent UTIs   . Night muscle spasms   . Paraplegia [facility])     Past Surgical History:  Procedure Laterality Date  . APPENDECTOMY    . FEMUR FRACTURE SURGERY    . kidney stone removal    . RT tibia fracture    . T8 T9 fusion     . TONSILLECTOMY      Family History  Problem Relation Age of Onset  . Hyperlipidemia Mother   . Diabetes Mother   . Rosacea Mother   . Lung cancer Mother        smoker  . Hypertension Neg Hx    Social History:  reports that he has never smoked. He has never used smokeless tobacco. He reports current alcohol use of about 10.0 standard drinks of alcohol per week. No history on file for drug use.  Allergies:  Allergies  Allergen Reactions  . Bactrim Rash    Skin sloughing   . Ropinirole Other (See Comments)    Increased leg spasms/poor sleep.   . Latex Rash    (Not in a hospital admission)   Results for orders placed or performed during the hospital encounter of 06/23/20 (from the past 48 hour(s))  CBC with Differential     Status: Abnormal   Collection Time: 06/23/20  2:30 PM  Result Value Ref Range    WBC 11.2 (H) 4.0 - 10.5 K/uL   RBC 4.76 4.22 - 5.81 MIL/uL   Hemoglobin 15.2 13.0 - 17.0 g/dL   HCT 45.0 39 - 52 %   MCV 94.5 80.0 - 100.0 fL   MCH 31.9 26.0 - 34.0 pg   MCHC 33.8 30.0 - 36.0 g/dL   RDW 12.9 11.5 - 15.5 %   Platelets 245 150 - 400 K/uL   nRBC 0.0 0.0 - 0.2 %   Neutrophils Relative % 82 %   Neutro Abs 9.2 (H) 1.7 - 7.7 K/uL   Lymphocytes Relative 7 %   Lymphs Abs 0.8 0.7 - 4.0 K/uL   Monocytes Relative 9 %   Monocytes Absolute 1.0 0.1 - 1.0 K/uL   Eosinophils Relative 1 %   Eosinophils Absolute 0.1 0 - 0 K/uL   Basophils Relative 1 %   Basophils Absolute 0.1 0 - 0 K/uL   Immature Granulocytes 0 %   Abs Immature Granulocytes 0.05 0.00 - 0.07 K/uL    Comment: Performed at [facility], [place] [address], [place], [place] [address]   DG Hip Unilat With Pelvis 2-3 Views Left  Result Date: 06/23/2020 CLINICAL DATA:  Fall 1 week ago EXAM: DG HIP (WITH OR WITHOUT PELVIS) 2-3V LEFT COMPARISON:  None. FINDINGS: There is an acute fracture of the proximal left femur, predominantly intertrochanteric. There is varus angulation and approximately 1.5 cm of medial displacement. Hip joint is intact without dislocation. Advanced degenerative changes of the left hip. Partially visualized ORIF hardware within the right femur. Pelvic bony ring intact. IMPRESSION: Acute fracture of the proximal left femur, predominantly intertrochanteric, with varus angulation and mild medial displacement. Electronically Signed   By: Davina Poke D.O.   On: 06/23/2020 14:08    X-rays electronically reviewed show displaced left proximal intertrochanteric fracture.  Review of Systems  All other systems reviewed and are negative.   Blood pressure 103/60, pulse 92, temperature 98.1 F (36.7 C), temperature source Oral, resp. rate 15, SpO2 95 %. Physical Exam Vitals reviewed.  Constitutional:      Appearance: Normal appearance.  HENT:     Head: Normocephalic and atraumatic.   Eyes:     Extraocular Movements: Extraocular movements intact.     Pupils: Pupils are equal, round, and reactive to light.  Cardiovascular:     Rate and Rhythm: Normal rate and regular rhythm.  Pulmonary:     Effort: Pulmonary effort is normal.     Breath sounds: Normal breath sounds.  Abdominal:     General: Bowel sounds are normal.     Palpations: Abdomen is soft.  Musculoskeletal:     Cervical back: Normal range of motion.     Left hip: Deformity present.  Neurological:     Mental Status: He is alert and oriented to person, place, and time.  Psychiatric:        Behavior: Behavior normal.     The patient fortunately has no flexion contracture at his left hip, left knee or left foot and ankle.  He is having significant spasming.  I was able to manipulate the proximal femur and pull traction on it I can feel motion at the fracture fragments themselves.   Assessment/Plan Displaced left proximal femur/intertrochanteric hip fracture  Fortunately he does not have any flexion contractures in the knee on the left side or his left foot and ankle.  Given the degree of spasming that he is having and the displacement of the fracture, we are recommending open reduction/internal fixation of this fracture and he does wish to proceed with this as well given amount of pain he has and the level of high functioning that he has in spite of his disability.  I did show him his x-rays and explained in detail what the surgery will involve.  I will let him eat and drink today with n.p.o. after midnight in anticipation of surgery tomorrow morning to address his left hip fracture.  The risk and benefits of surgery explained in detail and was thoroughly discussed as far as nonoperative and operative treatment modalities.  Without surgery this would certainly heal but he would probably be dealing with more significant spasming over the next week to 2 weeks.  No fracture on my exam still feels mobile.  Mcarthur Rossetti, MD 06/23/2020, 3:47 PM
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
## 2020-06-23 NOTE — ED Notes (Signed)
Attempted to call report to JT, floor RN, at time listed per purple man, no response. Will try again later.

## 2020-06-23 NOTE — Anesthesia Preprocedure Evaluation (Addendum)
Anesthesia Evaluation  Patient identified by MRN, date of birth, ID band Patient awake    Reviewed: Allergy & Precautions, H&P , NPO status , Patient's Chart, lab work & pertinent test results  Airway Mallampati: II  TM Distance: >3 FB Neck ROM: Full    Dental no notable dental hx. (+) Teeth Intact, Dental Advisory Given   Pulmonary neg pulmonary ROS,    Pulmonary exam normal breath sounds clear to auscultation       Cardiovascular Exercise Tolerance: Good negative cardio ROS Normal cardiovascular exam Rhythm:Regular Rate:Normal     Neuro/Psych Anxiety T8 paraplegic  Neuromuscular disease negative neurological ROS     GI/Hepatic negative GI ROS, Neg liver ROS,   Endo/Other  negative endocrine ROS  Renal/GU Renal disease  negative genitourinary   Musculoskeletal negative musculoskeletal ROS (+)   Abdominal   Peds negative pediatric ROS (+)  Hematology negative hematology ROS (+)   Anesthesia Other Findings   Reproductive/Obstetrics negative OB ROS                            Anesthesia Physical Anesthesia Plan  ASA: III  Anesthesia Plan: General   Post-op Pain Management:    Induction: Intravenous  PONV Risk Score and Plan: 2 and Ondansetron, Dexamethasone and Treatment may vary due to age or medical condition  Airway Management Planned: LMA and Oral ETT  Additional Equipment:   Intra-op Plan:   Post-operative Plan: Extubation in OR  Informed Consent: I have reviewed the patients History and Physical, chart, labs and discussed the procedure including the risks, benefits and alternatives for the proposed anesthesia with the patient or authorized representative who has indicated his/her understanding and acceptance.       Plan Discussed with: Anesthesiologist and CRNA  Anesthesia Plan Comments: (  )       Anesthesia Quick Evaluation

## 2020-06-23 NOTE — ED Notes (Signed)
Called report to JT on 3W

## 2020-06-23 NOTE — ED Triage Notes (Signed)
Per patient, states he thinks he broke his left hip-states he fell 1 week ago and has been suffering from "hyper reflexia" since.  States his wife is a physical therapist and palpated hip and it appears to be in "2 pieces"

## 2020-06-23 NOTE — ED Notes (Signed)
Called pt transport for transfer to 3W

## 2020-06-24 ENCOUNTER — Inpatient Hospital Stay (HOSPITAL_COMMUNITY): Payer: BC Managed Care – PPO

## 2020-06-24 ENCOUNTER — Inpatient Hospital Stay (HOSPITAL_COMMUNITY): Payer: BC Managed Care – PPO | Admitting: Anesthesiology

## 2020-06-24 ENCOUNTER — Encounter (HOSPITAL_COMMUNITY)
Admission: EM | Disposition: A | Discharge status: Home / Self Care | Payer: Self-pay | Source: Home / Self Care | Attending: Orthopaedic Surgery

## 2020-06-24 HISTORY — PX: INTRAMEDULLARY (IM) NAIL INTERTROCHANTERIC: SHX5875

## 2020-06-24 SURGERY — FIXATION, FRACTURE, INTERTROCHANTERIC, WITH INTRAMEDULLARY ROD
Anesthesia: General | Site: Hip | Laterality: Left

## 2020-06-24 MED ORDER — PHENYLEPHRINE 40 MCG/ML (10ML) SYRINGE FOR IV PUSH (FOR BLOOD PRESSURE SUPPORT)
PREFILLED_SYRINGE | INTRAVENOUS | Status: DC | PRN
  Administered 2020-06-24 (×4): 40 ug via INTRAVENOUS

## 2020-06-24 MED ORDER — OXYCODONE HCL 5 MG/5ML PO SOLN: 5.0000 mg | Freq: Once | ORAL | Status: DC | PRN

## 2020-06-24 MED ORDER — DEXAMETHASONE SODIUM PHOSPHATE 10 MG/ML IJ SOLN
INTRAMUSCULAR | Status: AC
  Filled 2020-06-24: qty 1

## 2020-06-24 MED ORDER — PHENOL 1.4 % MT LIQD: 1.0000 | OROMUCOSAL | Status: DC | PRN

## 2020-06-24 MED ORDER — TRANEXAMIC ACID-NACL 1000-0.7 MG/100ML-% IV SOLN
INTRAVENOUS | Status: AC
  Filled 2020-06-24: qty 100

## 2020-06-24 MED ORDER — PHENYLEPHRINE HCL (PRESSORS) 10 MG/ML IV SOLN
INTRAVENOUS | Status: AC
  Filled 2020-06-24: qty 1

## 2020-06-24 MED ORDER — LIDOCAINE 2% (20 MG/ML) 5 ML SYRINGE
INTRAMUSCULAR | Status: AC
  Filled 2020-06-24: qty 5

## 2020-06-24 MED ORDER — MIDAZOLAM HCL 2 MG/2ML IJ SOLN
INTRAMUSCULAR | Status: AC
  Filled 2020-06-24: qty 2

## 2020-06-24 MED ORDER — ONDANSETRON HCL 4 MG/2ML IJ SOLN
INTRAMUSCULAR | Status: DC | PRN
  Administered 2020-06-24: 4 mg via INTRAVENOUS

## 2020-06-24 MED ORDER — CEFAZOLIN SODIUM-DEXTROSE 2-4 GM/100ML-% IV SOLN
INTRAVENOUS | Status: AC
  Filled 2020-06-24: qty 100

## 2020-06-24 MED ORDER — CEFAZOLIN SODIUM-DEXTROSE 2-4 GM/100ML-% IV SOLN
2.0000 g | Freq: Four times a day (QID) | INTRAVENOUS | Status: AC
  Administered 2020-06-24 (×2): 2 g via INTRAVENOUS
  Filled 2020-06-24 (×2): qty 100

## 2020-06-24 MED ORDER — CEFAZOLIN SODIUM-DEXTROSE 2-3 GM-%(50ML) IV SOLR
INTRAVENOUS | Status: DC | PRN
  Administered 2020-06-24: 2 g via INTRAVENOUS

## 2020-06-24 MED ORDER — ONDANSETRON HCL 4 MG/2ML IJ SOLN
INTRAMUSCULAR | Status: AC
  Filled 2020-06-24: qty 2

## 2020-06-24 MED ORDER — ONDANSETRON HCL 4 MG/2ML IJ SOLN: 4.0000 mg | Freq: Four times a day (QID) | INTRAMUSCULAR | Status: DC | PRN

## 2020-06-24 MED ORDER — METOCLOPRAMIDE HCL 5 MG PO TABS: 5.0000 mg | ORAL_TABLET | Freq: Three times a day (TID) | ORAL | Status: DC | PRN

## 2020-06-24 MED ORDER — HYDROCODONE-ACETAMINOPHEN 7.5-325 MG PO TABS: 1.0000 | ORAL_TABLET | ORAL | Status: DC | PRN

## 2020-06-24 MED ORDER — MEPERIDINE HCL 50 MG/ML IJ SOLN: 6.2500 mg | INTRAMUSCULAR | Status: DC | PRN

## 2020-06-24 MED ORDER — ROCURONIUM BROMIDE 10 MG/ML (PF) SYRINGE
PREFILLED_SYRINGE | INTRAVENOUS | Status: AC
  Filled 2020-06-24: qty 10

## 2020-06-24 MED ORDER — DEXAMETHASONE SODIUM PHOSPHATE 10 MG/ML IJ SOLN
INTRAMUSCULAR | Status: DC | PRN
  Administered 2020-06-24: 4 mg via INTRAVENOUS

## 2020-06-24 MED ORDER — ACETAMINOPHEN 160 MG/5ML PO SOLN: 325.0000 mg | ORAL | Status: DC | PRN

## 2020-06-24 MED ORDER — FENTANYL CITRATE (PF) 100 MCG/2ML IJ SOLN
INTRAMUSCULAR | Status: AC
  Filled 2020-06-24: qty 2

## 2020-06-24 MED ORDER — METHOCARBAMOL 500 MG IVPB - SIMPLE MED: 500.0000 mg | Freq: Four times a day (QID) | INTRAVENOUS | Status: DC | PRN

## 2020-06-24 MED ORDER — ACETAMINOPHEN 325 MG PO TABS: 325.0000 mg | ORAL_TABLET | Freq: Four times a day (QID) | ORAL | Status: DC | PRN

## 2020-06-24 MED ORDER — SUGAMMADEX SODIUM 200 MG/2ML IV SOLN
INTRAVENOUS | Status: DC | PRN
  Administered 2020-06-24: 180 mg via INTRAVENOUS

## 2020-06-24 MED ORDER — PHENYLEPHRINE 40 MCG/ML (10ML) SYRINGE FOR IV PUSH (FOR BLOOD PRESSURE SUPPORT)
PREFILLED_SYRINGE | INTRAVENOUS | Status: AC
  Filled 2020-06-24: qty 10

## 2020-06-24 MED ORDER — PHENYLEPHRINE HCL-NACL 10-0.9 MG/250ML-% IV SOLN
INTRAVENOUS | Status: DC | PRN
  Administered 2020-06-24: 20 ug/min via INTRAVENOUS

## 2020-06-24 MED ORDER — ROCURONIUM BROMIDE 10 MG/ML (PF) SYRINGE
PREFILLED_SYRINGE | INTRAVENOUS | Status: DC | PRN
  Administered 2020-06-24: 50 mg via INTRAVENOUS

## 2020-06-24 MED ORDER — FENTANYL CITRATE (PF) 100 MCG/2ML IJ SOLN: 25.0000 ug | INTRAMUSCULAR | Status: DC | PRN

## 2020-06-24 MED ORDER — ONDANSETRON HCL 4 MG PO TABS: 4.0000 mg | ORAL_TABLET | Freq: Four times a day (QID) | ORAL | Status: DC | PRN

## 2020-06-24 MED ORDER — OXYCODONE HCL 5 MG PO TABS: 5.0000 mg | ORAL_TABLET | Freq: Once | ORAL | Status: DC | PRN

## 2020-06-24 MED ORDER — MORPHINE SULFATE (PF) 2 MG/ML IV SOLN: 0.5000 mg | INTRAVENOUS | Status: DC | PRN

## 2020-06-24 MED ORDER — 0.9 % SODIUM CHLORIDE (POUR BTL) OPTIME
TOPICAL | Status: DC | PRN
  Administered 2020-06-24: 1000 mL

## 2020-06-24 MED ORDER — PANTOPRAZOLE SODIUM 40 MG PO TBEC
40.0000 mg | DELAYED_RELEASE_TABLET | Freq: Every day | ORAL | Status: DC
  Filled 2020-06-24: qty 1

## 2020-06-24 MED ORDER — FENTANYL CITRATE (PF) 250 MCG/5ML IJ SOLN
INTRAMUSCULAR | Status: DC | PRN
Start: 2020-06-24 — End: 2020-06-24
  Administered 2020-06-24: 100 ug via INTRAVENOUS

## 2020-06-24 MED ORDER — METHOCARBAMOL 500 MG PO TABS: 500.0000 mg | ORAL_TABLET | Freq: Four times a day (QID) | ORAL | Status: DC | PRN

## 2020-06-24 MED ORDER — PROPOFOL 10 MG/ML IV BOLUS
INTRAVENOUS | Status: AC
  Filled 2020-06-24: qty 40

## 2020-06-24 MED ORDER — ONDANSETRON HCL 4 MG/2ML IJ SOLN: 4.0000 mg | Freq: Once | INTRAMUSCULAR | Status: DC | PRN

## 2020-06-24 MED ORDER — STERILE WATER FOR IRRIGATION IR SOLN
Status: DC | PRN
  Administered 2020-06-24: 1000 mL

## 2020-06-24 MED ORDER — ASPIRIN EC 325 MG PO TBEC
325.0000 mg | DELAYED_RELEASE_TABLET | Freq: Every day | ORAL | Status: DC
  Administered 2020-06-25: 325 mg via ORAL
  Filled 2020-06-24: qty 1

## 2020-06-24 MED ORDER — DOCUSATE SODIUM 100 MG PO CAPS
100.0000 mg | ORAL_CAPSULE | Freq: Two times a day (BID) | ORAL | Status: DC
  Filled 2020-06-24 (×2): qty 1

## 2020-06-24 MED ORDER — ALUM & MAG HYDROXIDE-SIMETH 200-200-20 MG/5ML PO SUSP: 30.0000 mL | ORAL | Status: DC | PRN

## 2020-06-24 MED ORDER — LACTATED RINGERS IV SOLN: INTRAVENOUS | Status: DC | PRN

## 2020-06-24 MED ORDER — ACETAMINOPHEN 325 MG PO TABS: 325.0000 mg | ORAL_TABLET | ORAL | Status: DC | PRN

## 2020-06-24 MED ORDER — MIDAZOLAM HCL 2 MG/2ML IJ SOLN
INTRAMUSCULAR | Status: DC | PRN
  Administered 2020-06-24: 2 mg via INTRAVENOUS

## 2020-06-24 MED ORDER — LIDOCAINE 2% (20 MG/ML) 5 ML SYRINGE
INTRAMUSCULAR | Status: DC | PRN
  Administered 2020-06-24: 100 mg via INTRAVENOUS

## 2020-06-24 MED ORDER — PROPOFOL 10 MG/ML IV BOLUS
INTRAVENOUS | Status: DC | PRN
  Administered 2020-06-24: 150 mg via INTRAVENOUS

## 2020-06-24 MED ORDER — HYDROCODONE-ACETAMINOPHEN 5-325 MG PO TABS: 1.0000 | ORAL_TABLET | ORAL | Status: DC | PRN

## 2020-06-24 MED ORDER — SODIUM CHLORIDE 0.9 % IV SOLN: INTRAVENOUS | Status: DC

## 2020-06-24 MED ORDER — MENTHOL 3 MG MT LOZG: 1.0000 | LOZENGE | OROMUCOSAL | Status: DC | PRN

## 2020-06-24 MED ORDER — METOCLOPRAMIDE HCL 5 MG/ML IJ SOLN: 5.0000 mg | Freq: Three times a day (TID) | INTRAMUSCULAR | Status: DC | PRN

## 2020-06-24 SURGICAL SUPPLY — 40 items
BIT DRILL INTERTAN LAG SCREW (BIT) ×2 IMPLANT
BNDG GAUZE ELAST 4 BULKY (GAUZE/BANDAGES/DRESSINGS) ×2 IMPLANT
COVER PERINEAL POST (MISCELLANEOUS) ×2 IMPLANT
COVER SURGICAL LIGHT HANDLE (MISCELLANEOUS) ×2 IMPLANT
COVER WAND RF STERILE (DRAPES) IMPLANT
DRAPE STERI IOBAN 125X83 (DRAPES) ×2 IMPLANT
DRESSING AQUACEL AG SP 3.5X4 (GAUZE/BANDAGES/DRESSINGS) ×1 IMPLANT
DRESSING AQUACEL AG SP 3.5X6 (GAUZE/BANDAGES/DRESSINGS) ×1 IMPLANT
DRSG AQUACEL AG SP 3.5X4 (GAUZE/BANDAGES/DRESSINGS) ×2
DRSG AQUACEL AG SP 3.5X6 (GAUZE/BANDAGES/DRESSINGS) ×2
DRSG CURAD 3X16 NADH (PACKING) ×2 IMPLANT
DRSG MEPILEX BORDER 4X4 (GAUZE/BANDAGES/DRESSINGS) ×4 IMPLANT
DRSG MEPILEX BORDER 4X8 (GAUZE/BANDAGES/DRESSINGS) IMPLANT
DURAPREP 26ML APPLICATOR (WOUND CARE) ×2 IMPLANT
ELECT REM PT RETURN 15FT ADLT (MISCELLANEOUS) ×2 IMPLANT
FACESHIELD WRAPAROUND (MASK) ×2 IMPLANT
GAUZE XEROFORM 5X9 LF (GAUZE/BANDAGES/DRESSINGS) ×2 IMPLANT
GLOVE BIO SURGEON STRL SZ7.5 (GLOVE) ×2 IMPLANT
GLOVE BIOGEL PI IND STRL 8 (GLOVE) ×1 IMPLANT
GLOVE BIOGEL PI INDICATOR 8 (GLOVE) ×1
GLOVE ECLIPSE 8.0 STRL XLNG CF (GLOVE) IMPLANT
GOWN STRL REUS W/TWL XL LVL3 (GOWN DISPOSABLE) ×2 IMPLANT
GUIDE PIN 3.2X343 (PIN) ×2
GUIDE PIN 3.2X343MM (PIN) ×2
KIT BASIN OR (CUSTOM PROCEDURE TRAY) ×2 IMPLANT
KIT TURNOVER KIT A (KITS) IMPLANT
MANIFOLD NEPTUNE II (INSTRUMENTS) ×2 IMPLANT
NAIL LEFT 10X36 (Nail) ×2 IMPLANT
NS IRRIG 1000ML POUR BTL (IV SOLUTION) ×2 IMPLANT
PACK GENERAL/GYN (CUSTOM PROCEDURE TRAY) ×2 IMPLANT
PENCIL SMOKE EVACUATOR (MISCELLANEOUS) IMPLANT
PIN GUIDE 3.2X343MM (PIN) ×2 IMPLANT
PROTECTOR NERVE ULNAR (MISCELLANEOUS) ×2 IMPLANT
SCREW LAG COMPR KIT 95/90 (Screw) ×2 IMPLANT
STAPLER VISISTAT 35W (STAPLE) ×2 IMPLANT
SUT VIC AB 0 CT1 36 (SUTURE) ×2 IMPLANT
SUT VIC AB 1 CT1 36 (SUTURE) ×2 IMPLANT
SUT VIC AB 2-0 CT1 27 (SUTURE) ×1
SUT VIC AB 2-0 CT1 TAPERPNT 27 (SUTURE) ×1 IMPLANT
TOWEL OR 17X26 10 PK STRL BLUE (TOWEL DISPOSABLE) ×2 IMPLANT

## 2020-06-24 NOTE — Transfer of Care (Signed)
Immediate Anesthesia Transfer of Care Note  Patient: Thomas Schroeder  Procedure(s) Performed: INTRAMEDULLARY (IM) NAIL FEMORAL (Left Hip)  Patient Location: PACU  Anesthesia Type:General  Level of Consciousness: awake, alert  and patient cooperative  Airway & Oxygen Therapy: Patient Spontanous Breathing and Patient connected to face mask oxygen  Post-op Assessment: Report given to RN and Post -op Vital signs reviewed and stable  Post vital signs: Reviewed and stable  Last Vitals:  Vitals Value Taken Time  BP    Temp    Pulse 90 06/24/20 0918  Resp 17 06/24/20 0918  SpO2 100 % 06/24/20 0918  Vitals shown include unvalidated device data.  Last Pain:  Vitals:   06/23/20 1928  TempSrc:   PainSc: Asleep      Patients Stated Pain Goal: 3 (96/28/36 6294)  Complications: No complications documented.

## 2020-06-24 NOTE — Anesthesia Procedure Notes (Signed)
Procedure Name: Intubation Date/Time: 06/24/2020 8:09 AM Performed by: Eben Burow, CRNA Pre-anesthesia Checklist: Patient identified, Emergency Drugs available, Suction available, Patient being monitored and Timeout performed Patient Re-evaluated:Patient Re-evaluated prior to induction Oxygen Delivery Method: Circle system utilized Preoxygenation: Pre-oxygenation with 100% oxygen Induction Type: IV induction Ventilation: Mask ventilation without difficulty Laryngoscope Size: Mac and 4 Grade View: Grade II Tube type: Oral Number of attempts: 1 Airway Equipment and Method: Stylet Placement Confirmation: ETT inserted through vocal cords under direct vision,  positive ETCO2 and breath sounds checked- equal and bilateral Secured at: 22 cm Tube secured with: Tape Dental Injury: Teeth and Oropharynx as per pre-operative assessment

## 2020-06-24 NOTE — Anesthesia Postprocedure Evaluation (Signed)
Anesthesia Post Note  Patient: Hazeline Junker  Procedure(s) Performed: INTRAMEDULLARY (IM) NAIL FEMORAL (Left Hip)     Patient location during evaluation: PACU Anesthesia Type: General Level of consciousness: awake and alert Pain management: pain level controlled Vital Signs Assessment: post-procedure vital signs reviewed and stable Respiratory status: spontaneous breathing, nonlabored ventilation, respiratory function stable and patient connected to nasal cannula oxygen Cardiovascular status: blood pressure returned to baseline and stable Postop Assessment: no apparent nausea or vomiting Anesthetic complications: no   No complications documented.  Last Vitals:  Vitals:   06/24/20 0448 06/24/20 0915  BP: (!) 119/57 129/86  Pulse: 93   Resp: 16 20  Temp: 36.7 C 36.8 C  SpO2: 94%     Last Pain:  Vitals:   06/23/20 1928  TempSrc:   PainSc: Asleep                 Jordon Bourquin

## 2020-06-24 NOTE — Progress Notes (Signed)
Patient ID: Thomas Schroeder, male   DOB: October 23, 1960, 59 y.o.   MRN: [id number] Our plan is to proceed to surgery today to address the patient's left hip fracture.  I discussed this with him in detail last evening and again today.  He understands the risk and benefits involved.  The left hip is been marked and informed consent is obtained.  There is been no acute changes in medical status.  Although he has a T8 paraplegic, he does experience significant spasming as a result of this injury.  Are goals are to stabilize the fracture to give him more comfort.

## 2020-06-24 NOTE — Brief Op Note (Signed)
06/24/2020  9:15 AM  PATIENT:  Thomas Schroeder  59 y.o. male  PRE-OPERATIVE DIAGNOSIS:  LEFT FEMUR FRACTURE  POST-OPERATIVE DIAGNOSIS:  LEFT FEMUR FRACTURE  PROCEDURE:  Procedure(s): INTRAMEDULLARY (IM) NAIL FEMORAL (Left)  SURGEON:  Surgeon(s) and Role:    * Mcarthur Rossetti, MD - Primary  ANESTHESIA:   general  EBL:  100 mL   COUNTS:  YES  PLAN OF CARE: Admit to inpatient   PATIENT DISPOSITION:  PACU - hemodynamically stable.   Delay start of Pharmacological VTE agent (>24hrs) due to surgical blood loss or risk of bleeding: no

## 2020-06-24 NOTE — Op Note (Signed)
NAME: Thomas Schroeder, Thomas Schroeder MEDICAL RECORD [id number] ACCOUNT [id number] DATE OF BIRTH:23-May-1961 FACILITY: [facility] LOCATION: [facility] PHYSICIAN:Nyna Chilton Kerry Fort, MD  OPERATIVE REPORT  DATE OF PROCEDURE:  06/24/2020  PREOPERATIVE DIAGNOSIS:  Left hip proximal intertrochanteric femur fracture.  POSTOPERATIVE DIAGNOSIS:  Left hip proximal intertrochanteric femur fracture.  PROCEDURE:  Open reduction/internal fixation of left proximal femur hip fracture using intramedullary nail and hip screws.  IMPLANTS:  Smith [employer]hew 10 mm x 36 cm femoral nail with a 95 mm lag/90 mm compression integrated locking system.  SURGEON:  Lind Guest.  Ninfa Linden, MD  ANESTHESIA:  General.  ANTIBIOTICS:  Two g IV Ancef.  ESTIMATED BLOOD LOSS:  100 mL.  COMPLICATIONS:  None.  INDICATIONS:  The patient is a 59 year old gentleman who is actually a TA paraplegic.  He mobilizes very well and in and out of his wheelchair and he has no flexion contractures in his hips, knees, feet or ankles.  Last week he had a hard mechanical fall  onto his left hip, but did not have any significant spasming afterwards.  Yesterday, he was getting down into his wheelchair and sat very hard and felt a pop in his hip and he had significant spasming after this.  He was found to have a complex  intertrochanteric hip fracture.  Due to significant spasming from this, he does elect for surgery.  Although he is nonweightbearing, he is definitely feeling this discomfort of this fracture surprisingly and having significant spasming as well.  I did  talk to him about getting the fracture realigned with hardware that will certainly help with his mobility in and out of his wheelchair as far as his transfers.  He does wish to proceed given the fracture and the risks and benefits that were described to  him.  DESCRIPTION OF PROCEDURE:  After informed consent was obtained, the appropriate left hip was marked.  He was brought to the operating  room and general anesthesia was obtained while he was on a stretcher.  He was then placed supine on the fracture table,  the perineal post in place and his left operative leg in line skeletal traction, his right hip flexed and adducted out of the field in a leg holder with appropriate padding of the popliteal area.  We then assessed the fracture under direct fluoroscopy  and it was definitely an acute fracture.  With traction, we were able to get it out to an acceptable length.  We then chose our femoral nail, keeping it sterile within the box, choosing a 10 x 360 mm femoral nail.  We opened this sterilely on the back  table.  His left hip was prepped and draped with DuraPrep and sterile drapes.  A time-out was called and he was identified as correct patient, correct left hip.  I then made an incision proximal to the greater trochanter and carried this down to the tip  of greater trochanter.  I then placed a temporary guidepin under direct fluoroscopy in antegrade fashion, crossing the fracture site.  We used an initiating reamer then opened up the femoral canal and then removed the guidepin.  We placed our femoral  nail in an antegrade fashion down the femoral canal without difficulty.  Using the outrigger guide to line up our interdigitating integrated lag screw compression screw, we made a separate lateral hip incision on the lateral aspect of the femur.  We  placed a temporary guidepin, traversing the fracture into the femoral head and neck.  Based off this,  we chose our construct to be a 95 mm lag screw with a 90 mm compression screw.  We drilled to the depth for these and placed those 2 screws without  difficulty.  We were pleased with the fracture alignment and placement of the hardware following this.  The outrigger guide was then removed.  We irrigated both wounds with normal saline solution, closed the deep tissue was 0 Vicryl, followed by 2-0  Vicryl in subcutaneous tissue and interrupted staples  on the skin incisions.  Xeroform and well-padded sterile dressing was applied.  The patient was awakened, extubated, and taken to recovery room in stable condition with all final counts being correct.   No complications noted.  VN/NUANCE  D:06/24/2020 T:06/24/2020 JOB:[id number]

## 2020-06-25 LAB — CBC
HCT: 30.5 % — ABNORMAL LOW (ref 39.0–52.0)
Hemoglobin: 10.1 g/dL — ABNORMAL LOW (ref 13.0–17.0)
MCH: 32.3 pg (ref 26.0–34.0)
MCHC: 33.1 g/dL (ref 30.0–36.0)
MCV: 97.4 fL (ref 80.0–100.0)
Platelets: 181 10*3/uL (ref 150–400)
RBC: 3.13 MIL/uL — ABNORMAL LOW (ref 4.22–5.81)
RDW: 13.2 % (ref 11.5–15.5)
WBC: 9.2 10*3/uL (ref 4.0–10.5)
nRBC: 0 % (ref 0.0–0.2)

## 2020-06-25 MED ORDER — SODIUM CHLORIDE 0.9 % IV BOLUS
500.0000 mL | Freq: Once | INTRAVENOUS | Status: AC
  Administered 2020-06-25: 500 mL via INTRAVENOUS

## 2020-06-25 NOTE — Discharge Instructions (Signed)
Increase her activities as comfort allows. You may get your current hip dressing wet in the shower daily. You can change this current dressing in 6 days and then leave that one on until your are outpatient follow-up in 2 weeks.

## 2020-06-25 NOTE — Progress Notes (Signed)
Patients blood pressure 85/54 after administration of 500cc bolus. MD notified.

## 2020-06-25 NOTE — Progress Notes (Signed)
Verbal order by MD Ninfa Linden for 500 cc NS bolus and CBC.

## 2020-06-25 NOTE — Progress Notes (Signed)
Subjective: 1 Day Post-Op Procedure(s) (LRB): INTRAMEDULLARY (IM) NAIL FEMORAL (Left) Patient reports pain as mild.  He has had episode of hypotension but otherwise feels normal and is not tachycardic.  His hemoglobin is 10.1 which is acute blood loss anemia.  He has had 1 IV fluid bolus and we are going to have him have 1 more IV fluid bolus.  He still expresses a desire to go home today.  Some of this hypotension may be just neurogenic related given his T8 paraplegia.  Objective: Vital signs in last 24 hours: Temp:  [98 F (36.7 C)-99.1 F (37.3 C)] 99.1 F (37.3 C) (10/10 0537) Pulse Rate:  [78-98] 98 (10/10 0537) Resp:  [13-22] 18 (10/10 0537) BP: (85-129)/(40-93) 85/54 (10/10 0757) SpO2:  [95 %-100 %] 97 % (10/10 0537)  Intake/Output from previous day: 10/09 0701 - 10/10 0700 In: 2750.2 [P.O.:960; I.V.:1540.2; IV Piggyback:250] Out: 2175 [Urine:2075; Blood:100] Intake/Output this shift: No intake/output data recorded.  Recent Labs    06/23/20 1430 06/25/20 0747  HGB 15.2 10.1*   Recent Labs    06/23/20 1430 06/25/20 0747  WBC 11.2* 9.2  RBC 4.76 3.13*  HCT 45.0 30.5*  PLT 245 181   Recent Labs    06/23/20 1430  NA 136  K 4.1  CL 103  CO2 23  BUN 20  CREATININE 1.00  GLUCOSE 109*  CALCIUM 9.2   No results for input(s): LABPT, INR in the last 72 hours.  Intact pulses distally Incision: scant drainage   Assessment/Plan: 1 Day Post-Op Procedure(s) (LRB): INTRAMEDULLARY (IM) NAIL FEMORAL (Left) Can discharge to home today. Fluid bolus for hypotension.     Mcarthur Rossetti 06/25/2020, 9:13 AM

## 2020-06-25 NOTE — Discharge Summary (Signed)
Patient ID: Thomas Schroeder MRN: [id number] DOB/AGE: 1961/08/31 59 y.o.  Admit date: 06/23/2020 Discharge date: 06/25/2020  Admission Diagnoses:  Principal Problem:   Closed comminuted intertrochanteric fracture of left femur [facility]) Active Problems:   Hip fracture [facility])   Discharge Diagnoses:  Same  Past Medical History:  Diagnosis Date  . Allergy   . Chronic indwelling Foley catheter   . Chronic kidney disease   . ED (erectile dysfunction)   . History of recurrent UTIs   . Night muscle spasms   . Paraplegia ([place])     Surgeries: Procedure(s): INTRAMEDULLARY (IM) NAIL FEMORAL on 06/24/2020   Consultants:   Discharged Condition: Improved  Hospital Course: Thomas Schroeder is an 59 y.o. male who was admitted 06/23/2020 for operative treatment ofClosed comminuted intertrochanteric fracture of left femur ([place]). Patient has severe unremitting pain that affects sleep, daily activities, and work/hobbies. After pre-op clearance the patient was taken to the operating room on 06/24/2020 and underwent  Procedure(s): INTRAMEDULLARY (IM) NAIL FEMORAL.    Patient was given perioperative antibiotics:  Anti-infectives (From admission, onward)   Start     Dose/Rate Route Frequency Ordered Stop   06/24/20 1400  ceFAZolin (ANCEF) IVPB 2g/100 mL premix        2 g 200 mL/hr over 30 Minutes Intravenous Every 6 hours 06/24/20 1027 06/24/20 2104   06/24/20 0729  ceFAZolin (ANCEF) 2-4 GM/100ML-% IVPB       Note to Pharmacy: Jefm Miles   : cabinet override      06/24/20 0729 06/24/20 1929       Patient was given sequential compression devices, early ambulation, and chemoprophylaxis to prevent DVT.  Patient benefited maximally from hospital stay and there were no complications.    Recent vital signs:  Patient Vitals for the past 24 hrs:  BP Temp Temp src Pulse Resp SpO2  06/25/20 1036 121/68 -- -- -- -- --  06/25/20 0757 (!) 85/54 -- -- -- -- --  06/25/20 0632 (!) 88/44 -- -- -- -- --  06/25/20  0537 (!) 87/40 99.1 F (37.3 C) Oral 98 18 97 %  06/25/20 0215 (!) 112/56 99 F (37.2 C) Oral 96 18 96 %  06/24/20 2201 (!) 127/93 98.1 F (36.7 C) Oral 98 16 97 %  06/24/20 1807 124/78 98.2 F (36.8 C) Oral 97 (!) 22 97 %  06/24/20 1351 114/69 98.7 F (37.1 C) Oral 89 16 96 %     Recent laboratory studies:  Recent Labs    06/23/20 1430 06/25/20 0747  WBC 11.2* 9.2  HGB 15.2 10.1*  HCT 45.0 30.5*  PLT 245 181  NA 136  --   K 4.1  --   CL 103  --   CO2 23  --   BUN 20  --   CREATININE 1.00  --   GLUCOSE 109*  --   CALCIUM 9.2  --      Discharge Medications:   Allergies as of 06/25/2020      Reactions   Bactrim Rash   Skin sloughing    Ropinirole Other (See Comments)   Increased leg spasms/poor sleep.    Latex Rash      Medication List    TAKE these medications   acetaminophen 500 MG tablet Commonly known as: TYLENOL Take 1,000 mg by mouth every 6 (six) hours as needed for moderate pain or headache.   aspirin 325 MG tablet Take 325 mg by mouth daily.   atorvastatin 20 MG tablet  Commonly known as: LIPITOR Take 1 tablet (20 mg total) by mouth daily.   CRANBERRY EXTRACT PO Take 1 capsule by mouth daily.   diazepam 10 MG tablet Commonly known as: VALIUM Take 2 tablets (20 mg total) by mouth every 8 (eight) hours as needed. What changed: reasons to take this   Fish Oil 1000 MG Caps Take 1,000 mg by mouth daily.   MULTI VITAMIN MENS PO Take 1 tablet by mouth daily.       Diagnostic Studies: DG C-Arm 1-60 Min-No Report  Result Date: 06/24/2020 Fluoroscopy was utilized by the requesting physician.  No radiographic interpretation.   DG HIP OPERATIVE UNILAT W OR W/O PELVIS LEFT  Result Date: 06/24/2020 CLINICAL DATA:  Intramedullary nailing of the left hip. EXAM: OPERATIVE LEFT HIP (WITH PELVIS IF PERFORMED)  VIEWS TECHNIQUE: Fluoroscopic spot image(s) were submitted for interpretation post-operatively. FLUOROSCOPY TIME:  48 seconds COMPARISON:  Left  hip radiographs-06/23/2020 FINDINGS: Five spot intraoperative fluoroscopic images of the left hip and femur are provided for review Images demonstrate the sequela of intramedullary fixation of the left femur and dynamic screw fixation of the left femoral neck transfixing previously identified intratrochanteric and proximal femur fracture. Alignment appears anatomic. There is a minimal amount of expected subcutaneous emphysema about the operative site. No radiopaque foreign body. IMPRESSION: Post intramedullary rod fixation of the left femur and dynamic screw fixation of the left femoral neck without evidence of complication. Electronically Signed   By: Sandi Mariscal M.D.   On: 06/24/2020 12:00   DG Hip Unilat With Pelvis 2-3 Views Left  Result Date: 06/23/2020 CLINICAL DATA:  Fall 1 week ago EXAM: DG HIP (WITH OR WITHOUT PELVIS) 2-3V LEFT COMPARISON:  None. FINDINGS: There is an acute fracture of the proximal left femur, predominantly intertrochanteric. There is varus angulation and approximately 1.5 cm of medial displacement. Hip joint is intact without dislocation. Advanced degenerative changes of the left hip. Partially visualized ORIF hardware within the right femur. Pelvic bony ring intact. IMPRESSION: Acute fracture of the proximal left femur, predominantly intertrochanteric, with varus angulation and mild medial displacement. Electronically Signed   By: Davina Poke D.O.   On: 06/23/2020 14:08    Disposition: Discharge disposition: 01-Home or Self Care          Follow-up Information    Mcarthur Rossetti, MD. Schedule an appointment as soon as possible for a visit in 2 week(s).   Specialty: Orthopedic Surgery Contact information: [address] [place] [address] [phone]                Signed: Mcarthur Rossetti 06/25/2020, 1:19 PM

## 2020-06-25 NOTE — Progress Notes (Signed)
Patient refusing therapy. RN went over d/c instructions and all questions were answered.

## 2020-06-25 NOTE — Progress Notes (Signed)
PT Cancellation Note  Patient Details Name: ARAEL PICCIONE MRN: [id number] DOB: 15-Jun-1961   Cancelled Treatment:     PT order received but evaluation deferred.  RN advises pt states no PT needs at this time and declines PT service.  Pt is long term T-8 para and managing at home (including following this fx) with assist of spouse who is a PT.   Amparo Donalson 06/25/2020, 11:53 AM

## 2020-06-25 NOTE — Progress Notes (Signed)
MD Ninfa Linden was notified of patient's BP 88/44 and MAP 57.

## 2020-06-26 ENCOUNTER — Encounter (HOSPITAL_COMMUNITY): Payer: Self-pay | Admitting: Orthopaedic Surgery

## 2020-07-06 ENCOUNTER — Ambulatory Visit (INDEPENDENT_AMBULATORY_CARE_PROVIDER_SITE_OTHER): Payer: BC Managed Care – PPO | Admitting: Orthopaedic Surgery

## 2020-07-06 ENCOUNTER — Ambulatory Visit (INDEPENDENT_AMBULATORY_CARE_PROVIDER_SITE_OTHER): Payer: BC Managed Care – PPO

## 2020-07-06 ENCOUNTER — Encounter: Payer: Self-pay | Admitting: Orthopaedic Surgery

## 2020-07-06 DIAGNOSIS — S72142D Displaced intertrochanteric fracture of left femur, subsequent encounter for closed fracture with routine healing: Secondary | ICD-10-CM

## 2020-07-06 DIAGNOSIS — S72002A Fracture of unspecified part of neck of left femur, initial encounter for closed fracture: Secondary | ICD-10-CM | POA: Diagnosis not present

## 2020-07-06 NOTE — Progress Notes (Signed)
The patient is a 59 year old paraplegic who we actually performed surgery on his left hip after an acute intertrochanteric fracture.  This was significantly malaligned and he was having a lot of spasms and it was appropriate to get him in better alignment.  He says he is doing well overall he has some discomfort and achy feeling but overall he is doing well.  The hip does move his unit when I put him through examination.  His incisions build lysis of the staples been removed and Steri-Strips applied.  2 views of the left femur show the hardware is intact and the fracture is in good alignment.  He will continue to increase his activities as comfort allows.  All question concerns were answered addressed.  I would like to see him back in 4 weeks but at that visit we can just have an AP and lateral of the left hip only.  We do not need to see the pelvis or the rest of the femur.

## 2020-07-17 ENCOUNTER — Telehealth: Payer: Self-pay

## 2020-07-17 ENCOUNTER — Other Ambulatory Visit: Payer: Self-pay | Admitting: Orthopaedic Surgery

## 2020-07-17 MED ORDER — HYDROCODONE-ACETAMINOPHEN 5-325 MG PO TABS: 1.0000 | ORAL_TABLET | Freq: Four times a day (QID) | ORAL | 0 refills | Status: DC | PRN

## 2020-07-17 NOTE — Telephone Encounter (Signed)
Patient called in wanting to get pain medication , says he declined twice but now he is having pain. [facility] [place].

## 2020-07-19 ENCOUNTER — Ambulatory Visit (INDEPENDENT_AMBULATORY_CARE_PROVIDER_SITE_OTHER): Payer: BC Managed Care – PPO | Admitting: Physician Assistant

## 2020-07-19 ENCOUNTER — Encounter: Payer: Self-pay | Admitting: Physician Assistant

## 2020-07-19 ENCOUNTER — Ambulatory Visit (INDEPENDENT_AMBULATORY_CARE_PROVIDER_SITE_OTHER): Payer: BC Managed Care – PPO

## 2020-07-19 DIAGNOSIS — M25552 Pain in left hip: Secondary | ICD-10-CM | POA: Diagnosis not present

## 2020-07-19 DIAGNOSIS — S72002A Fracture of unspecified part of neck of left femur, initial encounter for closed fracture: Secondary | ICD-10-CM

## 2020-07-19 DIAGNOSIS — M25559 Pain in unspecified hip: Secondary | ICD-10-CM

## 2020-07-19 NOTE — Progress Notes (Signed)
HPI: Thomas Schroeder 59 year old male well-known to our department service he is status post IM nailing of an intertrochanteric fracture on the left 06/24/2020.  He reports he was getting into his bed last night with his legs abducted and pushing to transfer himself from his wheelchair to the bed as he is paraplegic.  He states he heard 3 pops since that time.  He is having some increased pain in the left hip.  Review of systems see HPI otherwise negative.  Physical exam: Left hip able to put hip through range of motion without significant pain.  Radiographs: AP pelvis and lateral view of the left hip: Left hip is well located.  There is a new oblique type fracture involving the proximal medial femoral shaft and involving the lesser trochanter.  Slight medial displacement this fracture component.  The IM nail and compression screw otherwise appear well-seated.  No other fractures identified.  Impression: Status post left hip intertrochanteric IM nailing 06/24/2020 Acute left proximal femoral shaft fracture  Plan: We will refill his pain medication.  See him back in 1 week for repeat left femur films AP and lateral.  Questions were encouraged and answered by Dr. Ninfa Linden and myself.  We will try to treat this conservatively.

## 2020-07-20 ENCOUNTER — Telehealth: Payer: Self-pay | Admitting: Orthopaedic Surgery

## 2020-07-20 MED ORDER — HYDROCODONE-ACETAMINOPHEN 5-325 MG PO TABS: 1.0000 | ORAL_TABLET | Freq: Four times a day (QID) | ORAL | 0 refills | Status: DC | PRN

## 2020-07-20 NOTE — Telephone Encounter (Signed)
Pt called asking to have his hydrocodone sent to his [facility]

## 2020-07-20 NOTE — Telephone Encounter (Signed)
Pt called and informed.

## 2020-07-20 NOTE — Telephone Encounter (Signed)
I sent some in 

## 2020-07-26 ENCOUNTER — Encounter: Payer: Self-pay | Admitting: Orthopaedic Surgery

## 2020-07-26 ENCOUNTER — Ambulatory Visit (INDEPENDENT_AMBULATORY_CARE_PROVIDER_SITE_OTHER): Payer: BC Managed Care – PPO

## 2020-07-26 ENCOUNTER — Ambulatory Visit (INDEPENDENT_AMBULATORY_CARE_PROVIDER_SITE_OTHER): Payer: BC Managed Care – PPO | Admitting: Orthopaedic Surgery

## 2020-07-26 DIAGNOSIS — S72142D Displaced intertrochanteric fracture of left femur, subsequent encounter for closed fracture with routine healing: Secondary | ICD-10-CM

## 2020-07-26 NOTE — Progress Notes (Signed)
I want to see the patient back today to assess his proximal femur fracture.  This is on the left side.  He is mainly wheelchair-bound but does stand up and has a standing desk that he works.  He sustained a first intertrochanteric fracture but then felt a pop in his hip about a week ago.  He now has a break off of the subtrochanteric area of the lesser trochanter.  The overall alignment still has been well-maintained we have opted for nonsurgical treatment of this because this should still heal given his good quality of bone and high blood supply a young age.  We did get new x-rays today showing that his femur is in optimal alignment on the left side.  Also gave him a note keeping out of work the next 4 to 6 weeks to allow optimal healing.  I would like to see him back in 4 weeks with a repeat AP and lateral of the left femur.  All question concerns were answered and addressed.

## 2020-08-03 ENCOUNTER — Ambulatory Visit: Payer: BC Managed Care – PPO | Admitting: Orthopaedic Surgery

## 2020-08-16 ENCOUNTER — Ambulatory Visit: Payer: BC Managed Care – PPO | Admitting: Orthopaedic Surgery

## 2020-08-23 ENCOUNTER — Encounter: Payer: Self-pay | Admitting: Orthopaedic Surgery

## 2020-08-23 ENCOUNTER — Ambulatory Visit (INDEPENDENT_AMBULATORY_CARE_PROVIDER_SITE_OTHER): Payer: BC Managed Care – PPO

## 2020-08-23 ENCOUNTER — Ambulatory Visit (INDEPENDENT_AMBULATORY_CARE_PROVIDER_SITE_OTHER): Payer: BC Managed Care – PPO | Admitting: Orthopaedic Surgery

## 2020-08-23 DIAGNOSIS — S72142D Displaced intertrochanteric fracture of left femur, subsequent encounter for closed fracture with routine healing: Secondary | ICD-10-CM

## 2020-08-23 MED ORDER — HYDROCODONE-ACETAMINOPHEN 5-325 MG PO TABS: 1.0000 | ORAL_TABLET | Freq: Four times a day (QID) | ORAL | 0 refills | Status: DC | PRN

## 2020-08-23 NOTE — Progress Notes (Addendum)
The patient is now 8 weeks status post open reduction/internal fixation of a complex left proximal femur fracture.  He is someone who is wheelchair-bound but is very incredibly active more than even some people who can walk.  Postoperatively he had extension of his fracture.  We are just following this along clinically and radiographically because the lateral side wall is intact of the fracture and we know the rest of this will fill in with time.  He still having some discomfort but not as much as before.  I did feel it appropriate to refill his hydrocodone.  He does let me put his left hip to rotation without difficulty.  New x-rays today compared to x-rays from a month ago of the left hip show that there is been some slight interval healing of the fracture and the overall alignment is acceptable.  The lateral sidewall is the most important part is intact.  I would like to repeat 2 views of his left hip in 4 weeks from now.  By then it will be 90 days postop.  He may end up qualifying for a bone stimulator if needed.  All questions and concerns were answered and addressed.

## 2020-08-31 DIAGNOSIS — N310 Uninhibited neuropathic bladder, not elsewhere classified: Secondary | ICD-10-CM | POA: Diagnosis not present

## 2020-08-31 DIAGNOSIS — Z87442 Personal history of urinary calculi: Secondary | ICD-10-CM | POA: Diagnosis not present

## 2020-08-31 DIAGNOSIS — N210 Calculus in bladder: Secondary | ICD-10-CM | POA: Diagnosis not present

## 2020-09-20 ENCOUNTER — Ambulatory Visit (INDEPENDENT_AMBULATORY_CARE_PROVIDER_SITE_OTHER): Payer: BC Managed Care – PPO | Admitting: Orthopaedic Surgery

## 2020-09-20 ENCOUNTER — Encounter: Payer: Self-pay | Admitting: Orthopaedic Surgery

## 2020-09-20 ENCOUNTER — Ambulatory Visit (INDEPENDENT_AMBULATORY_CARE_PROVIDER_SITE_OTHER): Payer: BC Managed Care – PPO

## 2020-09-20 ENCOUNTER — Telehealth: Payer: Self-pay | Admitting: Orthopaedic Surgery

## 2020-09-20 DIAGNOSIS — S72142D Displaced intertrochanteric fracture of left femur, subsequent encounter for closed fracture with routine healing: Secondary | ICD-10-CM

## 2020-09-20 NOTE — Progress Notes (Signed)
Thomas Schroeder is a paraplegic who is almost 11 weeks out from a complex subtrochanteric proximal femur fracture on the left side.  We placed intramedullary rod and hip screws.  The fracture does displace but he is demonstrated ability to start healing the fracture.  His pain is much less.  He is asked appropriate questions about being able to stand and standing chair that he has nothing to be appropriate for helping bone turnover.  On exam I can easily put his hip through internal and external rotation on the left side and is not having the spasms that he was before.  2 views of the left femur show that there is probably some consolidation of the bone and evidence of callus formation.  There is no evidence of hardware failure.  I would like to see him back in 4 weeks for repeat x-rays of the left hip with 2 views the left hip.  At that point we may consider a bone stimulator.  All questions and concerns were answered and addressed.

## 2020-09-20 NOTE — Telephone Encounter (Signed)
Called patient left message to return call to schedule an appointment for left femur Fx   [facility] follow up)

## 2020-09-25 ENCOUNTER — Telehealth: Payer: Self-pay | Admitting: Orthopaedic Surgery

## 2020-09-25 NOTE — Telephone Encounter (Signed)
Received call from patient. He received letter from [employer] that they havenot received requested records. I advised pt that I faxed 08/23/20 ov note to [employer] on 1/3. Told him I would refaxed and also would include 09/20/20 ov note. 08/23/20 & 09/20/20 ov notes faxed to [facility] claim# [id number],YKD [phone]

## 2020-10-02 ENCOUNTER — Telehealth (INDEPENDENT_AMBULATORY_CARE_PROVIDER_SITE_OTHER): Payer: BC Managed Care – PPO | Admitting: Family Medicine

## 2020-10-02 ENCOUNTER — Encounter: Payer: Self-pay | Admitting: Family Medicine

## 2020-10-02 ENCOUNTER — Encounter: Payer: Self-pay | Admitting: *Deleted

## 2020-10-02 DIAGNOSIS — M549 Dorsalgia, unspecified: Secondary | ICD-10-CM

## 2020-10-02 DIAGNOSIS — R509 Fever, unspecified: Secondary | ICD-10-CM | POA: Diagnosis not present

## 2020-10-02 DIAGNOSIS — R197 Diarrhea, unspecified: Secondary | ICD-10-CM

## 2020-10-02 DIAGNOSIS — R059 Cough, unspecified: Secondary | ICD-10-CM

## 2020-10-02 MED ORDER — METRONIDAZOLE 500 MG PO TABS: 500.0000 mg | ORAL_TABLET | Freq: Two times a day (BID) | ORAL | 0 refills | Status: DC

## 2020-10-02 MED ORDER — DOXYCYCLINE HYCLATE 100 MG PO TABS: 100.0000 mg | ORAL_TABLET | Freq: Two times a day (BID) | ORAL | 0 refills | Status: DC
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
## 2020-10-03 ENCOUNTER — Encounter: Payer: Self-pay | Admitting: Family Medicine

## 2020-10-04 ENCOUNTER — Other Ambulatory Visit: Payer: Self-pay

## 2020-10-04 ENCOUNTER — Ambulatory Visit (INDEPENDENT_AMBULATORY_CARE_PROVIDER_SITE_OTHER): Payer: BC Managed Care – PPO

## 2020-10-04 ENCOUNTER — Telehealth: Payer: BC Managed Care – PPO | Admitting: Family Medicine

## 2020-10-04 DIAGNOSIS — R509 Fever, unspecified: Secondary | ICD-10-CM | POA: Diagnosis not present

## 2020-10-04 DIAGNOSIS — R197 Diarrhea, unspecified: Secondary | ICD-10-CM

## 2020-10-05 LAB — COMPLETE METABOLIC PANEL WITH GFR
AG Ratio: 0.9 (calc) — ABNORMAL LOW (ref 1.0–2.5)
ALT: 47 U/L — ABNORMAL HIGH (ref 9–46)
AST: 32 U/L (ref 10–35)
Albumin: 3.1 g/dL — ABNORMAL LOW (ref 3.6–5.1)
Alkaline phosphatase (APISO): 287 U/L — ABNORMAL HIGH (ref 35–144)
BUN: 9 mg/dL (ref 7–25)
CO2: 24 mmol/L (ref 20–32)
Calcium: 8.9 mg/dL (ref 8.6–10.3)
Chloride: 98 mmol/L (ref 98–110)
Creat: 0.77 mg/dL (ref 0.70–1.25)
GFR, Est African American: 114 mL/min/{1.73_m2} (ref >=60)
GFR, Est Non African American: 99 mL/min/{1.73_m2} (ref >=60)
Globulin: 3.3 g/dL (calc) (ref 1.9–3.7)
Glucose, Bld: 100 mg/dL (ref 65–139)
Potassium: 4.4 mmol/L (ref 3.5–5.3)
Sodium: 133 mmol/L — ABNORMAL LOW (ref 135–146)
Total Bilirubin: 0.4 mg/dL (ref 0.2–1.2)
Total Protein: 6.4 g/dL (ref 6.1–8.1)

## 2020-10-05 LAB — CBC WITH DIFFERENTIAL/PLATELET
Absolute Monocytes: 1499 cells/uL — ABNORMAL HIGH (ref 200–950)
Basophils Absolute: 67 cells/uL (ref 0–200)
Basophils Relative: 0.6 %
Eosinophils Absolute: 67 cells/uL (ref 15–500)
Eosinophils Relative: 0.6 %
HCT: 34.9 % — ABNORMAL LOW (ref 38.5–50.0)
Hemoglobin: 11.3 g/dL — ABNORMAL LOW (ref 13.2–17.1)
Lymphs Abs: 988 cells/uL (ref 850–3900)
MCH: 26.9 pg — ABNORMAL LOW (ref 27.0–33.0)
MCHC: 32.4 g/dL (ref 32.0–36.0)
MCV: 83.1 fL (ref 80.0–100.0)
MPV: 9.5 fL (ref 7.5–12.5)
Monocytes Relative: 13.5 %
Neutro Abs: 8480 cells/uL — ABNORMAL HIGH (ref 1500–7800)
Neutrophils Relative %: 76.4 %
Platelets: 448 10*3/uL — ABNORMAL HIGH (ref 140–400)
RBC: 4.2 10*6/uL (ref 4.20–5.80)
RDW: 13.8 % (ref 11.0–15.0)
Total Lymphocyte: 8.9 %
WBC: 11.1 10*3/uL — ABNORMAL HIGH (ref 3.8–10.8)

## 2020-10-05 LAB — LIPASE: Lipase: 63 U/L — ABNORMAL HIGH (ref 7–60)

## 2020-10-05 LAB — CLOSTRIDIUM DIFFICILE TOXIN B, QUALITATIVE, REAL-TIME PCR: Toxigenic C. Difficile by PCR: NOT DETECTED

## 2020-10-06 ENCOUNTER — Other Ambulatory Visit: Payer: Self-pay | Admitting: *Deleted

## 2020-10-06 ENCOUNTER — Encounter: Payer: Self-pay | Admitting: Family Medicine

## 2020-10-06 DIAGNOSIS — R899 Unspecified abnormal finding in specimens from other organs, systems and tissues: Secondary | ICD-10-CM

## 2020-10-06 DIAGNOSIS — Z960 Presence of urogenital implants: Secondary | ICD-10-CM

## 2020-10-06 DIAGNOSIS — R509 Fever, unspecified: Secondary | ICD-10-CM

## 2020-10-06 DIAGNOSIS — R748 Abnormal levels of other serum enzymes: Secondary | ICD-10-CM

## 2020-10-06 MED ORDER — AZITHROMYCIN 250 MG PO TABS: ORAL_TABLET | ORAL | 0 refills | Status: AC

## 2020-10-13 NOTE — Telephone Encounter (Signed)
Kennedy for in person next week. Doesn't have to be urgent. He is welcome to do UA and culture downstairs today if he would like so can process over the weekend but want in person appt for persistant fevers

## 2020-10-13 NOTE — Addendum Note (Signed)
Addended by: Narda Rutherford on: 10/13/2020 02:58 PM   Modules accepted: Orders

## 2020-10-16 ENCOUNTER — Encounter: Payer: Self-pay | Admitting: Family Medicine

## 2020-10-16 ENCOUNTER — Ambulatory Visit (INDEPENDENT_AMBULATORY_CARE_PROVIDER_SITE_OTHER): Payer: BC Managed Care – PPO | Admitting: Family Medicine

## 2020-10-16 ENCOUNTER — Other Ambulatory Visit: Payer: Self-pay

## 2020-10-16 VITALS — BP 124/66 | HR 95 | Temp 97.6°F

## 2020-10-16 DIAGNOSIS — Z960 Presence of urogenital implants: Secondary | ICD-10-CM | POA: Diagnosis not present

## 2020-10-16 DIAGNOSIS — H938X1 Other specified disorders of right ear: Secondary | ICD-10-CM

## 2020-10-16 DIAGNOSIS — N390 Urinary tract infection, site not specified: Secondary | ICD-10-CM | POA: Diagnosis not present

## 2020-10-16 DIAGNOSIS — R509 Fever, unspecified: Secondary | ICD-10-CM

## 2020-10-16 DIAGNOSIS — R748 Abnormal levels of other serum enzymes: Secondary | ICD-10-CM | POA: Diagnosis not present

## 2020-10-16 DIAGNOSIS — R899 Unspecified abnormal finding in specimens from other organs, systems and tissues: Secondary | ICD-10-CM

## 2020-10-16 LAB — POCT URINALYSIS DIP (CLINITEK)
Bilirubin, UA: NEGATIVE
Glucose, UA: NEGATIVE mg/dL
Ketones, POC UA: NEGATIVE mg/dL
Nitrite, UA: NEGATIVE
POC PROTEIN,UA: NEGATIVE
Spec Grav, UA: <=1.005 — AB (ref 1.010–1.025)
Urobilinogen, UA: 0.2 E.U./dL
pH, UA: 6.5 (ref 5.0–8.0)

## 2020-10-16 NOTE — Progress Notes (Signed)
Established Patient Office Visit  Subjective:  Patient ID: Thomas Schroeder, male    DOB: 01-25-1961  Age: 60 y.o. MRN: [id number]  CC:  Chief Complaint  Patient presents with  . Urinary Tract Infection    HPI Thomas Schroeder presents for follow-up of recent illness and recurrent fevers.  He is feeling significantly better than he was previously but still not quite back to baseline he still having some intermittent low-grade fevers.  He said he finally just got to the point of stopping to check it now he says a couple times a week he will just, wake up in a pool of sweat he can also tell that when he feels "comfortable" that he is probably running a little low-grade temperature.  He still struggling with some fatigue and falling asleep easily during the daytime.  Right now he is out of work for hip surgery so it has not impacted his work ability.  Ports that his appetite is down a little bit.  He has lost a few more pounds.  He reports that he still coughing some and getting some sputum production but again it is better he is getting mostly clear sputum.  His incision is healing well for his hip he has not had any wound issues or drainage etc. the orthopedist has seen it a couple times and has not had any concerns.  Does have a little bit of skin breakdown on his buttock area that he took a picture of he has some DuoDERM on it right now he has not had an open wound or any drainage.  Just some mild erythema with a little bit of scabbing in the center.  No other wounds anywhere else.  He does report that his right ear feels a little clogged.  Diarrhea that he was previously having seems to have improved significantly still having some occasional loose stools but not as severe as it was.  Again seems to be improving.  Past Medical History:  Diagnosis Date  . Allergy   . Chronic indwelling Foley catheter   . Chronic kidney disease   . ED (erectile dysfunction)   . History of recurrent UTIs   .  Night muscle spasms   . Paraplegia [facility])     Past Surgical History:  Procedure Laterality Date  . APPENDECTOMY    . FEMUR FRACTURE SURGERY    . INTRAMEDULLARY (IM) NAIL INTERTROCHANTERIC Left 06/24/2020   Procedure: INTRAMEDULLARY (IM) NAIL FEMORAL;  Surgeon: Mcarthur Rossetti, MD;  Location: [facility] ORS;  Service: Orthopedics;  Laterality: Left;  . kidney stone removal    . RT tibia fracture    . T8 T9 fusion     . TONSILLECTOMY      Family History  Problem Relation Age of Onset  . Hyperlipidemia Mother   . Diabetes Mother   . Rosacea Mother   . Lung cancer Mother        smoker  . Hypertension Neg Hx     Social History   Socioeconomic History  . Marital status: Soil scientist    Spouse name: Not on file  . Number of children: 2  . Years of education: Not on file  . Highest education level: Not on file  Occupational History    Employer: [employer]  Tobacco Use  . Smoking status: Never Smoker  . Smokeless tobacco: Never Used  Substance and Sexual Activity  . Alcohol use: Yes    Alcohol/week: 10.0 standard drinks  Types: 10 Standard drinks or equivalent per week    Comment: per week  . Drug use: Not on file  . Sexual activity: Yes    Partners: Female  Other Topics Concern  . Not on file  Social History Narrative   1 caffeine drinks per day.  Some exercise.    Social Determinants of Health   Financial Resource Strain: Not on file  Food Insecurity: Not on file  Transportation Needs: Not on file  Physical Activity: Not on file  Stress: Not on file  Social Connections: Not on file  Intimate Partner Violence: Not on file    Outpatient Medications Prior to Visit  Medication Sig Dispense Refill  . aspirin 325 MG tablet Take 325 mg by mouth daily.    Marland Kitchen atorvastatin (LIPITOR) 20 MG tablet Take 1 tablet (20 mg total) by mouth daily. 1 tablet 0  . CRANBERRY EXTRACT PO Take 1 capsule by mouth daily.    . Multiple Vitamin (MULTI VITAMIN MENS PO) Take 1  tablet by mouth daily.    . Omega-3 Fatty Acids (FISH OIL) 1000 MG CAPS Take 1,000 mg by mouth daily.     Marland Kitchen doxycycline (VIBRA-TABS) 100 MG tablet Take 1 tablet (100 mg total) by mouth 2 (two) times daily. 20 tablet 0  . HYDROcodone-acetaminophen (NORCO/VICODIN) 5-325 MG tablet Take 1-2 tablets by mouth every 6 (six) hours as needed for moderate pain. 30 tablet 0  . metroNIDAZOLE (FLAGYL) 500 MG tablet Take 1 tablet (500 mg total) by mouth 2 (two) times daily. 14 tablet 0   No facility-administered medications prior to visit.    Allergies  Allergen Reactions  . Bactrim Rash    Skin sloughing   . Ropinirole Other (See Comments)    Increased leg spasms/poor sleep.   . Latex Rash    ROS Review of Systems    Objective:    Physical Exam  BP 124/66   Pulse 95   Temp 97.6 F (36.4 C) (Bladder)   SpO2 100%  Wt Readings from Last 3 Encounters:  06/23/20 190 lb (86.2 kg)  04/20/20 180 lb (81.6 kg)  06/16/15 180 lb (81.6 kg)     Health Maintenance Due  Topic Date Due  . COVID-19 Vaccine (3 - Booster for Pfizer series) 07/07/2020    There are no preventive care reminders to display for this patient.  Lab Results  Component Value Date   TSH 1.899 03/28/2009   Lab Results  Component Value Date   WBC 11.1 (H) 10/04/2020   HGB 11.3 (L) 10/04/2020   HCT 34.9 (L) 10/04/2020   MCV 83.1 10/04/2020   PLT 448 (H) 10/04/2020   Lab Results  Component Value Date   NA 137 10/16/2020   K 4.6 10/16/2020   CO2 29 10/16/2020   GLUCOSE 117 (H) 10/16/2020   BUN 8 10/16/2020   CREATININE 0.73 10/16/2020   BILITOT 0.4 10/16/2020   ALKPHOS 86 04/19/2020   AST 26 10/16/2020   ALT 34 10/16/2020   PROT 6.8 10/16/2020   ALBUMIN 3.4 (L) 04/19/2020   CALCIUM 9.7 10/16/2020   ANIONGAP 10 06/23/2020   Lab Results  Component Value Date   CHOL 147 06/07/2020   Lab Results  Component Value Date   HDL 36 (L) 06/07/2020   Lab Results  Component Value Date   LDLCALC 80 06/07/2020    Lab Results  Component Value Date   TRIG 217 (H) 06/07/2020   Lab Results  Component Value Date  CHOLHDL 4.1 06/07/2020   Lab Results  Component Value Date   HGBA1C 5.5 11/25/2019      Assessment & Plan:   Problem List Items Addressed This Visit   None   Visit Diagnoses    Urethral catheter present for long-term use    -  Primary   Relevant Orders   POCT URINALYSIS DIP (CLINITEK) (Completed)   Urine Culture   Elevated alkaline phosphatase level       Relevant Orders   COMPLETE METABOLIC PANEL WITH GFR (Completed)   Ear fullness, right       Abnormal laboratory test result       Fever of unknown origin          Right ear fullness-exam is normal.  Can consider a trial of a nasal steroid spray to see if this helps.  Likely secondary to recent upper respiratory infection.  Still having intermittent low-grade fevers-we will check a urine culture today we will call with results once available again seems to be defervesced saying and actually improving somewhat on its own.  Also had previously elevated alkaline phosphatase level so I want a recheck that and make sure that it is trending downward and that is not something more abdominal going on.  Fever-sounds like wounds are not current source.  Again it seems like the fever is defervesced seeing and gradually getting better still getting some clear sputum.  Urine culture pending  No orders of the defined types were placed in this encounter.   Follow-up: Return if symptoms worsen or fail to improve.   I spent 35 minutes on the day of the encounter to include pre-visit record review, face-to-face time with the patient and post visit ordering of test.   [name], MD

## 2020-10-17 ENCOUNTER — Encounter: Payer: Self-pay | Admitting: Family Medicine

## 2020-10-17 LAB — COMPLETE METABOLIC PANEL WITH GFR
AG Ratio: 0.9 (calc) — ABNORMAL LOW (ref 1.0–2.5)
ALT: 34 U/L (ref 9–46)
AST: 26 U/L (ref 10–35)
Albumin: 3.3 g/dL — ABNORMAL LOW (ref 3.6–5.1)
Alkaline phosphatase (APISO): 281 U/L — ABNORMAL HIGH (ref 35–144)
BUN: 8 mg/dL (ref 7–25)
CO2: 29 mmol/L (ref 20–32)
Calcium: 9.7 mg/dL (ref 8.6–10.3)
Chloride: 99 mmol/L (ref 98–110)
Creat: 0.73 mg/dL (ref 0.70–1.25)
GFR, Est African American: 117 mL/min/{1.73_m2} (ref >=60)
GFR, Est Non African American: 101 mL/min/{1.73_m2} (ref >=60)
Globulin: 3.5 g/dL (calc) (ref 1.9–3.7)
Glucose, Bld: 117 mg/dL — ABNORMAL HIGH (ref 65–99)
Potassium: 4.6 mmol/L (ref 3.5–5.3)
Sodium: 137 mmol/L (ref 135–146)
Total Bilirubin: 0.4 mg/dL (ref 0.2–1.2)
Total Protein: 6.8 g/dL (ref 6.1–8.1)

## 2020-10-18 ENCOUNTER — Ambulatory Visit: Payer: Self-pay

## 2020-10-18 ENCOUNTER — Ambulatory Visit (INDEPENDENT_AMBULATORY_CARE_PROVIDER_SITE_OTHER): Payer: BC Managed Care – PPO | Admitting: Orthopaedic Surgery

## 2020-10-18 ENCOUNTER — Encounter: Payer: Self-pay | Admitting: Orthopaedic Surgery

## 2020-10-18 DIAGNOSIS — S72142D Displaced intertrochanteric fracture of left femur, subsequent encounter for closed fracture with routine healing: Secondary | ICD-10-CM

## 2020-10-18 NOTE — Progress Notes (Signed)
[name] is now several months into a complex left proximal femur fracture.  He is someone who is a paraplegic but does have pain response and spasms from fractures.  He was taken to the operating room for fixation of the femur fracture.  It is soft bone there was some extension of the subtrochanteric area.  This is made for longer healing and continued pain.  He is very diligent with never having developed any type of pressure ulcers given his paraplegia.  He is still not in the position to be able to work it.  He is not getting good sleep at night and is still having enough pain that is definitely affecting his mobility, his quality life and is exhibited a living.  This also affects his concentration for work as well.  X-rays today show that there has been interval healing of the fracture but it is not healed yet.  There is no complicating features of the hardware but is definitely not healed.  I can put his hip the range of motion it does move as a unit on the left side but he still exhibiting some pain response.  I will need to keep him out of work at least 4 more weeks as he continues to heal this fracture group will absolutely take longer to heal given the fact that he is nonweightbearing.  We will see him back in 4 weeks with an AP and lateral of the left hip.

## 2020-10-19 LAB — URINE CULTURE
MICRO NUMBER:: 11479038
SPECIMEN QUALITY:: ADEQUATE

## 2020-10-19 MED ORDER — NITROFURANTOIN MONOHYD MACRO 100 MG PO CAPS: 100.0000 mg | ORAL_CAPSULE | Freq: Two times a day (BID) | ORAL | 0 refills | Status: DC

## 2020-10-19 NOTE — Addendum Note (Signed)
Addended by: [name] D on: 10/19/2020 05:44 PM   Modules accepted: Orders

## 2020-10-21 ENCOUNTER — Encounter: Payer: Self-pay | Admitting: Family Medicine

## 2020-10-25 ENCOUNTER — Other Ambulatory Visit: Payer: Self-pay | Admitting: Family Medicine

## 2020-10-25 MED ORDER — NITROFURANTOIN MONOHYD MACRO 100 MG PO CAPS
100.0000 mg | ORAL_CAPSULE | Freq: Two times a day (BID) | ORAL | 0 refills | Status: DC
Start: 2020-10-25 — End: 2020-12-05

## 2020-11-15 ENCOUNTER — Ambulatory Visit (INDEPENDENT_AMBULATORY_CARE_PROVIDER_SITE_OTHER): Payer: BC Managed Care – PPO | Admitting: Orthopaedic Surgery

## 2020-11-15 ENCOUNTER — Encounter: Payer: Self-pay | Admitting: Orthopaedic Surgery

## 2020-11-15 ENCOUNTER — Ambulatory Visit: Payer: Self-pay

## 2020-11-15 DIAGNOSIS — S72142D Displaced intertrochanteric fracture of left femur, subsequent encounter for closed fracture with routine healing: Secondary | ICD-10-CM | POA: Diagnosis not present

## 2020-11-15 MED ORDER — HYDROCODONE-ACETAMINOPHEN 5-325 MG PO TABS: 1.0000 | ORAL_TABLET | Freq: Four times a day (QID) | ORAL | 0 refills | Status: DC | PRN

## 2020-11-15 NOTE — Progress Notes (Signed)
Patient is a very pleasant 60 year old paraplegic who is now 5 months out from a significant fracture to his left proximal femur.  He actually has some pain sensation and spasm sensations and so warranted surgery due to the malposition of his fracture.  It has taken some time to heal since he is someone he does not bear weight.  He is very mobile in his wheelchair and is very active.  His wife is a Transport planner.  He has been out of work as well.  He is still been having some occasional spasms and pain.  I can put his left leg through internal and external rotation as well as flexion extension with no difficulties.  There is some slight spasming still.  Trace continue to show his fracture lines but there has been abundant interval healing.  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  I will keep him out of work the next 4 weeks as he goes through aggressive therapy to get his conditioning and strength back.  I gave him a note to return to work starting December 18, 2020.  I would like to see him back in 2 months with a final AP and lateral of the left hip only.  We do not need to see the pelvis.  All question concerns were answered and addressed.

## 2020-11-21 ENCOUNTER — Other Ambulatory Visit: Payer: Self-pay | Admitting: Family Medicine

## 2020-11-27 DIAGNOSIS — N200 Calculus of kidney: Secondary | ICD-10-CM | POA: Diagnosis not present

## 2020-11-27 DIAGNOSIS — N310 Uninhibited neuropathic bladder, not elsewhere classified: Secondary | ICD-10-CM | POA: Diagnosis not present

## 2020-12-05 ENCOUNTER — Encounter: Payer: Self-pay | Admitting: Family Medicine

## 2020-12-05 ENCOUNTER — Ambulatory Visit (INDEPENDENT_AMBULATORY_CARE_PROVIDER_SITE_OTHER): Payer: BC Managed Care – PPO | Admitting: Family Medicine

## 2020-12-05 ENCOUNTER — Other Ambulatory Visit: Payer: Self-pay

## 2020-12-05 VITALS — BP 128/82 | HR 90 | Ht 66.0 in | Wt 169.3 lb

## 2020-12-05 DIAGNOSIS — Z0000 Encounter for general adult medical examination without abnormal findings: Secondary | ICD-10-CM

## 2020-12-05 DIAGNOSIS — Z1211 Encounter for screening for malignant neoplasm of colon: Secondary | ICD-10-CM | POA: Diagnosis not present

## 2020-12-05 DIAGNOSIS — E7800 Pure hypercholesterolemia, unspecified: Secondary | ICD-10-CM | POA: Diagnosis not present

## 2020-12-05 DIAGNOSIS — R7309 Other abnormal glucose: Secondary | ICD-10-CM | POA: Diagnosis not present

## 2020-12-05 NOTE — Patient Instructions (Signed)

## 2020-12-05 NOTE — Progress Notes (Signed)
Established Patient Office Visit  Subjective:  Patient ID: Thomas Schroeder, male    DOB: Sep 03, 1961  Age: 60 y.o. MRN: [id number]  CC:  Chief Complaint  Patient presents with  . Annual Exam    HPI Thomas Schroeder presents for CPE. He is doing well overall.  Still struggling some with his hip that he fractured he did see Dr. Ninfa Linden since I last saw him he still waiting for it to continue to heal.  Will be returning to work in early April full-time.  Recent chest pain or shortness of breath.  Past Medical History:  Diagnosis Date  . Allergy   . Chronic indwelling Foley catheter   . Chronic kidney disease   . ED (erectile dysfunction)   . History of recurrent UTIs   . Night muscle spasms   . Paraplegia [facility])     Past Surgical History:  Procedure Laterality Date  . APPENDECTOMY    . FEMUR FRACTURE SURGERY    . INTRAMEDULLARY (IM) NAIL INTERTROCHANTERIC Left 06/24/2020   Procedure: INTRAMEDULLARY (IM) NAIL FEMORAL;  Surgeon: Mcarthur Rossetti, MD;  Location: [facility];  Service: Orthopedics;  Laterality: Left;  . kidney stone removal    . RT tibia fracture    . T8 T9 fusion     . TONSILLECTOMY      Family History  Problem Relation Age of Onset  . Hyperlipidemia Mother   . Diabetes Mother   . Rosacea Mother   . Lung cancer Mother        smoker  . Hypertension Neg Hx     Social History   Socioeconomic History  . Marital status: Soil scientist    Spouse name: Not on file  . Number of children: 2  . Years of education: Not on file  . Highest education level: Not on file  Occupational History    Employer: [employer]  Tobacco Use  . Smoking status: Never Smoker  . Smokeless tobacco: Never Used  Substance and Sexual Activity  . Alcohol use: Yes    Alcohol/week: 10.0 standard drinks    Types: 10 Standard drinks or equivalent per week    Comment: per week  . Drug use: Not on file  . Sexual activity: Yes    Partners: Female  Other Topics Concern  . Not on file   Social History Narrative   1 caffeine drinks per day.  Some exercise.    Social Determinants of Health   Financial Resource Strain: Not on file  Food Insecurity: Not on file  Transportation Needs: Not on file  Physical Activity: Not on file  Stress: Not on file  Social Connections: Not on file  Intimate Partner Violence: Not on file    Outpatient Medications Prior to Visit  Medication Sig Dispense Refill  . aspirin 325 MG tablet Take 325 mg by mouth daily.    Marland Kitchen atorvastatin (LIPITOR) 20 MG tablet TAKE 1 TABLET AT BEDTIME 90 tablet 3  . CRANBERRY EXTRACT PO Take 1 capsule by mouth daily.    Marland Kitchen HYDROcodone-acetaminophen (NORCO/VICODIN) 5-325 MG tablet Take 1 tablet by mouth every 6 (six) hours as needed for moderate pain. 30 tablet 0  . Multiple Vitamin (MULTI VITAMIN MENS PO) Take 1 tablet by mouth daily.    . Omega-3 Fatty Acids (FISH OIL) 1000 MG CAPS Take 1,000 mg by mouth daily.     . nitrofurantoin, macrocrystal-monohydrate, (MACROBID) 100 MG capsule Take 1 capsule (100 mg total) by mouth 2 (two)  times daily. 6 capsule 0   No facility-administered medications prior to visit.    Allergies  Allergen Reactions  . Bactrim Rash    Skin sloughing   . Ropinirole Other (See Comments)    Increased leg spasms/poor sleep.   . Latex Rash    ROS Review of Systems    Objective:    Physical Exam Constitutional:      Appearance: He is well-developed.     Comments: Sitting in a wheelchair.   HENT:     Head: Normocephalic and atraumatic.     Right Ear: Tympanic membrane, ear canal and external ear normal.     Left Ear: Tympanic membrane, ear canal and external ear normal.     Nose: Nose normal.     Mouth/Throat:     Mouth: Mucous membranes are moist.     Pharynx: Oropharynx is clear. No posterior oropharyngeal erythema.  Eyes:     Conjunctiva/sclera: Conjunctivae normal.     Pupils: Pupils are equal, round, and reactive to light.  Neck:     Thyroid: No thyromegaly.      Comments: No carotid bruitds Cardiovascular:     Rate and Rhythm: Normal rate.     Heart sounds: Normal heart sounds.  Pulmonary:     Effort: Pulmonary effort is normal.     Breath sounds: Normal breath sounds.  Abdominal:     General: Abdomen is flat.     Palpations: Abdomen is soft.  Musculoskeletal:     Cervical back: Neck supple.     Comments: NO LE edema   Lymphadenopathy:     Cervical: No cervical adenopathy.  Skin:    General: Skin is warm and dry.  Neurological:     Mental Status: He is alert and oriented to person, place, and time.  Psychiatric:        Mood and Affect: Mood normal.        Behavior: Behavior normal.        Thought Content: Thought content normal.     BP 128/82   Pulse 90   Ht 5\' 6"  (1.676 m)   Wt 169 lb 4.8 oz (76.8 kg)   SpO2 100%   BMI 27.33 kg/m  Wt Readings from Last 3 Encounters:  12/05/20 169 lb 4.8 oz (76.8 kg)  06/23/20 190 lb (86.2 kg)  04/20/20 180 lb (81.6 kg)     There are no preventive care reminders to display for this patient.  There are no preventive care reminders to display for this patient.  Lab Results  Component Value Date   TSH 1.899 03/28/2009   Lab Results  Component Value Date   WBC 11.1 (H) 10/04/2020   HGB 11.3 (L) 10/04/2020   HCT 34.9 (L) 10/04/2020   MCV 83.1 10/04/2020   PLT 448 (H) 10/04/2020   Lab Results  Component Value Date   NA 137 10/16/2020   K 4.6 10/16/2020   CO2 29 10/16/2020   GLUCOSE 117 (H) 10/16/2020   BUN 8 10/16/2020   CREATININE 0.73 10/16/2020   BILITOT 0.4 10/16/2020   ALKPHOS 86 04/19/2020   AST 26 10/16/2020   ALT 34 10/16/2020   PROT 6.8 10/16/2020   ALBUMIN 3.4 (L) 04/19/2020   CALCIUM 9.7 10/16/2020   ANIONGAP 10 06/23/2020   Lab Results  Component Value Date   CHOL 147 06/07/2020   Lab Results  Component Value Date   HDL 36 (L) 06/07/2020   Lab Results  Component Value Date  [place] 80 06/07/2020   Lab Results  Component Value Date   TRIG 217 (H)  06/07/2020   Lab Results  Component Value Date   CHOLHDL 4.1 06/07/2020   Lab Results  Component Value Date   HGBA1C 5.5 11/25/2019      Assessment & Plan:   Problem List Items Addressed This Visit   None   Visit Diagnoses    Wellness examination    -  Primary   Relevant Orders   COMPLETE METABOLIC PANEL WITH GFR   HgB A1c   PSA   Lipid Panel w/reflex Direct LDL   Screen for colon cancer       Relevant Orders   Cologuard     Keep up a regular exercise program and make sure you are eating a healthy diet Try to eat 4 servings of dairy a day, or if you are lactose intolerant take a calcium with vitamin D daily.  Your vaccines are up to date.   Previously had colonoscopy would prefer to switch to Cologuard.  Will fax order today.   Health Maintenance  Topic Date Due  . COLONOSCOPY (Pts 45-74yrs Insurance coverage will need to be confirmed)  03/16/2021  . TETANUS/TDAP  08/04/2024  . INFLUENZA VACCINE  Completed  . COVID-19 Vaccine  Completed  . Hepatitis C Screening  Completed  . HIV Screening  Completed  . HPV VACCINES  Aged Out     No orders of the defined types were placed in this encounter.   Follow-up: Return in about 6 months (around 06/07/2021).    [name], MD

## 2020-12-06 ENCOUNTER — Encounter: Payer: Self-pay | Admitting: Family Medicine

## 2020-12-06 LAB — LIPID PANEL W/REFLEX DIRECT LDL
Cholesterol: 104 mg/dL (ref <200)
HDL: 28 mg/dL — ABNORMAL LOW (ref >=40)
LDL Cholesterol (Calc): 58 mg/dL (calc)
Non-HDL Cholesterol (Calc): 76 mg/dL (calc) (ref <130)
Total CHOL/HDL Ratio: 3.7 (calc) (ref <5.0)
Triglycerides: 99 mg/dL (ref <150)

## 2020-12-06 LAB — COMPLETE METABOLIC PANEL WITH GFR
AG Ratio: 1.1 (calc) (ref 1.0–2.5)
ALT: 19 U/L (ref 9–46)
AST: 17 U/L (ref 10–35)
Albumin: 3.9 g/dL (ref 3.6–5.1)
Alkaline phosphatase (APISO): 114 U/L (ref 35–144)
BUN: 11 mg/dL (ref 7–25)
CO2: 28 mmol/L (ref 20–32)
Calcium: 10.5 mg/dL — ABNORMAL HIGH (ref 8.6–10.3)
Chloride: 100 mmol/L (ref 98–110)
Creat: 0.74 mg/dL (ref 0.70–1.25)
GFR, Est African American: 116 mL/min/{1.73_m2} (ref >=60)
GFR, Est Non African American: 100 mL/min/{1.73_m2} (ref >=60)
Globulin: 3.4 g/dL (calc) (ref 1.9–3.7)
Glucose, Bld: 118 mg/dL — ABNORMAL HIGH (ref 65–99)
Potassium: 4.6 mmol/L (ref 3.5–5.3)
Sodium: 138 mmol/L (ref 135–146)
Total Bilirubin: 0.5 mg/dL (ref 0.2–1.2)
Total Protein: 7.3 g/dL (ref 6.1–8.1)

## 2020-12-06 LAB — HEMOGLOBIN A1C
Hgb A1c MFr Bld: 5.9 % of total Hgb — ABNORMAL HIGH (ref <5.7)
Mean Plasma Glucose: 123 mg/dL
eAG (mmol/L): 6.8 mmol/L

## 2020-12-06 LAB — PSA: PSA: 0.79 ng/mL (ref <=4.0)

## 2020-12-10 DIAGNOSIS — Z1211 Encounter for screening for malignant neoplasm of colon: Secondary | ICD-10-CM | POA: Diagnosis not present

## 2020-12-12 LAB — COLOGUARD: Cologuard: POSITIVE — AB

## 2020-12-22 ENCOUNTER — Telehealth: Payer: Self-pay | Admitting: Family Medicine

## 2020-12-22 DIAGNOSIS — R195 Other fecal abnormalities: Secondary | ICD-10-CM

## 2020-12-22 NOTE — Telephone Encounter (Signed)
Pt stated he is ok with the referral to GI. He would prefer a [place] location.

## 2020-12-22 NOTE — Telephone Encounter (Signed)
Call patient: Cologuard result is positive which means he would really benefit from a colonoscopy.  I would recommend we place referral to GI for consultation.  If he is okay with this please let me know and if he has a preference for provider please let us know.

## 2020-12-22 NOTE — Telephone Encounter (Signed)
Referral sent 

## 2020-12-28 ENCOUNTER — Encounter: Payer: Self-pay | Admitting: Gastroenterology

## 2021-01-01 ENCOUNTER — Encounter: Payer: Self-pay | Admitting: Family Medicine

## 2021-01-08 ENCOUNTER — Ambulatory Visit (INDEPENDENT_AMBULATORY_CARE_PROVIDER_SITE_OTHER): Payer: BC Managed Care – PPO | Admitting: Family Medicine

## 2021-01-08 ENCOUNTER — Encounter: Payer: Self-pay | Admitting: Family Medicine

## 2021-01-08 ENCOUNTER — Other Ambulatory Visit: Payer: Self-pay

## 2021-01-08 VITALS — BP 120/81 | HR 80 | Temp 98.2°F | Ht 66.0 in | Wt 163.0 lb

## 2021-01-08 DIAGNOSIS — L98491 Non-pressure chronic ulcer of skin of other sites limited to breakdown of skin: Secondary | ICD-10-CM

## 2021-01-08 DIAGNOSIS — R399 Unspecified symptoms and signs involving the genitourinary system: Secondary | ICD-10-CM | POA: Diagnosis not present

## 2021-01-08 DIAGNOSIS — R319 Hematuria, unspecified: Secondary | ICD-10-CM | POA: Diagnosis not present

## 2021-01-08 LAB — POCT URINALYSIS DIP (CLINITEK)
Bilirubin, UA: NEGATIVE
Glucose, UA: NEGATIVE mg/dL
Ketones, POC UA: NEGATIVE mg/dL
Nitrite, UA: POSITIVE — AB
POC PROTEIN,UA: 100 — AB
Spec Grav, UA: 1.01 (ref 1.010–1.025)
Urobilinogen, UA: 0.2 E.U./dL
pH, UA: 7 (ref 5.0–8.0)

## 2021-01-08 NOTE — Progress Notes (Addendum)
Established Patient Office Visit  Subjective:  Patient ID: Thomas Schroeder, male    DOB: 1960-12-23  Age: 59 y.o. MRN: [id number]  CC:  Chief Complaint  Patient presents with  . Urinary Tract Infection    HPI Thomas Schroeder presents for urinary symptoms.  He says since January he has been passing solid matter through his catheter.  He sent some pictures through [place].  He says he has seen to urologist 1 in January and then 1 in April but that [facility].  They recommend bladder irrigation.  Been irrigating 5 times a week with 500 mL of 5% vinegar.  His biggest concern is that for the last 5 to 6 months he has been leaking around his catheter at night to the point that he has to wear an adult diaper.  He says sometimes it leaks during the day as well he never really had major issues with this previously.  But he feels like it is getting clogged and then when he tries to use the syringe to inject a little bit of fluid he has difficulty getting it to move through once it finally gives way he then is noticing particulate matter and what looks like mucus.  And sometimes occasionally blood.  He says it is happening almost every day whereas before he was never really having problems with this.  He also notices that when he does the bladder irrigations above that he will also break out in a sweat over his lower abdomen buttock and thigh area even though he is not experiencing pain but he is a paraplegic and cannot feel pain in that area.  The moisture from wearing the adult diaper is also causing some skin breakdown he was able to show photograph to me of 2 lesions right next which other one a little bit more rounded 1 more oval-shaped with the largest one being a centimeter across.  He is now placed DuoDERM on the area and feels like that has been helpful and it is getting better.  He also reports feeling like he is getting spasms in his bladder.Marland Kitchen  He reports that is only been noticeable for the last month and is  more new.  No fevers chills or sweats.  No appetite and fatigued for several months.  He says for about a month after hip fracture he felt extremely fatigued and had not appetite and had a hard time getting out of bed. He has gradually gotten better but still not perfect.  He has been eating again but gets full quickly so hasn't been eating as much and has been losing weight.   Past Medical History:  Diagnosis Date  . Allergy   . Chronic indwelling Foley catheter   . Chronic kidney disease   . ED (erectile dysfunction)   . History of recurrent UTIs   . Night muscle spasms   . Paraplegia [facility])     Past Surgical History:  Procedure Laterality Date  . APPENDECTOMY    . FEMUR FRACTURE SURGERY    . INTRAMEDULLARY (IM) NAIL INTERTROCHANTERIC Left 06/24/2020   Procedure: INTRAMEDULLARY (IM) NAIL FEMORAL;  Surgeon: Mcarthur Rossetti, MD;  Location: [facility];  Service: Orthopedics;  Laterality: Left;  . kidney stone removal    . RT tibia fracture    . T8 T9 fusion     . TONSILLECTOMY      Family History  Problem Relation Age of Onset  . Hyperlipidemia Mother   . Diabetes Mother   .  Rosacea Mother   . Lung cancer Mother        smoker  . Hypertension Neg Hx     Social History   Socioeconomic History  . Marital status: Soil scientist    Spouse name: Not on file  . Number of children: 2  . Years of education: Not on file  . Highest education level: Not on file  Occupational History    Employer: [employer]  Tobacco Use  . Smoking status: Never Smoker  . Smokeless tobacco: Never Used  Substance and Sexual Activity  . Alcohol use: Yes    Alcohol/week: 10.0 standard drinks    Types: 10 Standard drinks or equivalent per week    Comment: per week  . Drug use: Not on file  . Sexual activity: Yes    Partners: Female  Other Topics Concern  . Not on file  Social History Narrative   1 caffeine drinks per day.  Some exercise.    Social Determinants of Health    Financial Resource Strain: Not on file  Food Insecurity: Not on file  Transportation Needs: Not on file  Physical Activity: Not on file  Stress: Not on file  Social Connections: Not on file  Intimate Partner Violence: Not on file    Outpatient Medications Prior to Visit  Medication Sig Dispense Refill  . aspirin 325 MG tablet Take 325 mg by mouth daily.    Marland Kitchen atorvastatin (LIPITOR) 20 MG tablet TAKE 1 TABLET AT BEDTIME 90 tablet 3  . CRANBERRY EXTRACT PO Take 1 capsule by mouth daily.    Marland Kitchen HYDROcodone-acetaminophen (NORCO/VICODIN) 5-325 MG tablet Take 1 tablet by mouth every 6 (six) hours as needed for moderate pain. 30 tablet 0  . Multiple Vitamin (MULTI VITAMIN MENS PO) Take 1 tablet by mouth daily.    . Omega-3 Fatty Acids (FISH OIL) 1000 MG CAPS Take 1,000 mg by mouth daily.      No facility-administered medications prior to visit.    Allergies  Allergen Reactions  . Bactrim Rash    Skin sloughing   . Ropinirole Other (See Comments)    Increased leg spasms/poor sleep.   . Latex Rash    ROS Review of Systems    Objective:    Physical Exam Vitals reviewed.  Constitutional:      Appearance: He is well-developed.  HENT:     Head: Normocephalic and atraumatic.  Eyes:     Conjunctiva/sclera: Conjunctivae normal.  Cardiovascular:     Rate and Rhythm: Normal rate.  Pulmonary:     Effort: Pulmonary effort is normal.  Skin:    General: Skin is dry.     Coloration: Skin is not pale.  Neurological:     Mental Status: He is alert and oriented to person, place, and time.  Psychiatric:        Behavior: Behavior normal.     BP 120/81   Pulse 80   Temp 98.2 F (36.8 C)   Ht 5\' 6"  (1.676 m)   Wt 163 lb (73.9 kg)   SpO2 100%   BMI 26.31 kg/m  Wt Readings from Last 3 Encounters:  01/08/21 163 lb (73.9 kg)  12/05/20 169 lb 4.8 oz (76.8 kg)  06/23/20 190 lb (86.2 kg)     There are no preventive care reminders to display for this patient.  There are no  preventive care reminders to display for this patient.  Lab Results  Component Value Date   TSH 1.899 03/28/2009  Lab Results  Component Value Date   WBC 11.1 (H) 10/04/2020   HGB 11.3 (L) 10/04/2020   HCT 34.9 (L) 10/04/2020   MCV 83.1 10/04/2020   PLT 448 (H) 10/04/2020   Lab Results  Component Value Date   NA 138 12/05/2020   K 4.6 12/05/2020   CO2 28 12/05/2020   GLUCOSE 118 (H) 12/05/2020   BUN 11 12/05/2020   CREATININE 0.74 12/05/2020   BILITOT 0.5 12/05/2020   ALKPHOS 86 04/19/2020   AST 17 12/05/2020   ALT 19 12/05/2020   PROT 7.3 12/05/2020   ALBUMIN 3.4 (L) 04/19/2020   CALCIUM 10.5 (H) 12/05/2020   ANIONGAP 10 06/23/2020   Lab Results  Component Value Date   CHOL 104 12/05/2020   Lab Results  Component Value Date   HDL 28 (L) 12/05/2020   Lab Results  Component Value Date   LDLCALC 58 12/05/2020   Lab Results  Component Value Date   TRIG 99 12/05/2020   Lab Results  Component Value Date   CHOLHDL 3.7 12/05/2020   Lab Results  Component Value Date   HGBA1C 5.9 (H) 12/05/2020      Assessment & Plan:   Problem List Items Addressed This Visit      Musculoskeletal and Integument   Perineal ulcer, limited to breakdown of skin ([place])    Other Visit Diagnoses    Symptom involving bladder    -  Primary   Relevant Orders   POCT URINALYSIS DIP (CLINITEK) (Completed)   Ambulatory referral to Urology   Hematuria, unspecified type       Relevant Orders   Urine Culture   Ambulatory referral to Urology      Urinalysis today was positive for nitrites leukocytes and large amount of blood will send for culture.  I am not sure why all of a sudden he is having problems with the lot of breakoff of debris that is causing repetitive clogging of his tubing when he was not previously having this problem and to the point where he is actually leaking around the catheter and having to wear an adult diaper.  He has now been doing the bladder irrigations with  water and 5% vinegar for a couple of months now that he is really not noticed any improvement.  This then in turn is causing significant breakdown which he is currently using DuoDERM and that seems to be helping.  Weight loss-he has been losing weight but he is also been portion controlling which explains the weight loss.  Though we will keep an eye on this.  Perineal ulcer-secondary to moisture-currently has DuoDERM and is treating it.  Call if symptoms worsen or if it is not healing well.  No orders of the defined types were placed in this encounter.  I spent 45 minutes on the day of the encounter to include pre-visit record review, face-to-face time with the patient and post visit ordering of test.   Follow-up: No follow-ups on file.    [name], MD

## 2021-01-09 NOTE — Addendum Note (Signed)
Addended by: [name] D on: 01/09/2021 07:57 AM   Modules accepted: Orders

## 2021-01-09 NOTE — Telephone Encounter (Signed)
HI Jenny Reichmann,  Can you update her Urology referal. He wants to see Dr. Nicolette Bang.  Im gave you the wrong name.  Sorry about that. It should still be at [facility].

## 2021-01-10 LAB — URINE CULTURE
MICRO NUMBER:: 11812944
SPECIMEN QUALITY:: ADEQUATE

## 2021-01-16 ENCOUNTER — Encounter: Payer: Self-pay | Admitting: Gastroenterology

## 2021-01-16 ENCOUNTER — Ambulatory Visit (INDEPENDENT_AMBULATORY_CARE_PROVIDER_SITE_OTHER): Payer: BC Managed Care – PPO | Admitting: Gastroenterology

## 2021-01-16 VITALS — BP 130/78 | HR 96 | Ht 66.0 in | Wt 163.0 lb

## 2021-01-16 DIAGNOSIS — Z1211 Encounter for screening for malignant neoplasm of colon: Secondary | ICD-10-CM | POA: Insufficient documentation

## 2021-01-16 DIAGNOSIS — R195 Other fecal abnormalities: Secondary | ICD-10-CM | POA: Insufficient documentation

## 2021-01-16 MED ORDER — CLENPIQ 10-3.5-12 MG-GM -GM/160ML PO SOLN: 1.0000 | ORAL | 0 refills | Status: DC

## 2021-01-16 NOTE — Progress Notes (Signed)
01/16/2021 Thomas Schroeder [id number] [birth year]   HISTORY OF PRESENT ILLNESS:  This is a 60 year old male who is new to our practice.  He is here today to schedule a colonoscopy for positive Colguard 11/2020.  He tells me that he had a colonoscopy "somewhere in [place]" when he was 31.  He thinks that he had polyps removed but does not know details.  He denies any GI complaints.  No rectal bleeding.  Moves his bowels well.  He is paraplegic and has limited mobility and is not able to get on the exam table without assistance.  Referred here by Dr. Madilyn Fireman for positive Cologuard.   Past Medical History:  Diagnosis Date  . Allergy   . Chronic indwelling Foley catheter   . Chronic kidney disease   . ED (erectile dysfunction)   . History of recurrent UTIs   . Night muscle spasms   . Paraplegia [facility])    Past Surgical History:  Procedure Laterality Date  . APPENDECTOMY    . FEMUR FRACTURE SURGERY    . INTRAMEDULLARY (IM) NAIL INTERTROCHANTERIC Left 06/24/2020   Procedure: INTRAMEDULLARY (IM) NAIL FEMORAL;  Surgeon: Mcarthur Rossetti, MD;  [facility];  Service: Orthopedics;  Laterality: Left;  . kidney stone removal    . RT tibia fracture    . T8 T9 fusion     . TONSILLECTOMY      reports that he has never smoked. He has never used smokeless tobacco. He reports current alcohol use of about 10.0 standard drinks of alcohol per week. No history on file for drug use. family history includes Diabetes in his mother; Hyperlipidemia in his mother; Lung cancer in his mother; Rosacea in his mother. Allergies  Allergen Reactions  . Bactrim Rash    Skin sloughing   . Ropinirole Other (See Comments)    Increased leg spasms/poor sleep.   . Latex Rash      Outpatient Encounter Medications as of 01/16/2021  Medication Sig  . aspirin 325 MG tablet Take 325 mg by mouth daily.  Marland Kitchen atorvastatin (LIPITOR) 20 MG tablet TAKE 1 TABLET AT BEDTIME  . CRANBERRY EXTRACT PO Take 1 capsule by  mouth daily.  Marland Kitchen HYDROcodone-acetaminophen (NORCO/VICODIN) 5-325 MG tablet Take 1 tablet by mouth every 6 (six) hours as needed for moderate pain.  . Multiple Vitamin (MULTI VITAMIN MENS PO) Take 1 tablet by mouth daily.  . Omega-3 Fatty Acids (FISH OIL) 1000 MG CAPS Take 1,000 mg by mouth daily.    No facility-administered encounter medications on file as of 01/16/2021.     REVIEW OF SYSTEMS  : All other systems reviewed and negative except where noted in the History of Present Illness.   PHYSICAL EXAM: BP 130/78 (BP Location: Left Arm, Patient Position: Sitting)   Pulse 96   Ht 5\' 6"  (1.676 m)   Wt 163 lb (73.9 kg)   SpO2 97%   BMI 26.31 kg/m  General: Well developed white male in no acute distress Head: Normocephalic and atraumatic Eyes:  Sclerae anicteric, conjunctiva pink. Ears: Normal auditory acuity Lungs: Clear throughout to auscultation; no W/R/R. Heart: Regular rate and rhythm; no M/R/G. Abdomen: Soft, non-distended.  BS present.  Non-tender. Rectal:  Will be done at the time of colonoscopy. Musculoskeletal: Symmetrical with no gross deformities  Skin: No lesions on visible extremities Extremities: No edema  Neurological: Alert oriented x 4, grossly non-focal Psychological:  Alert and cooperative. Normal mood and affect  ASSESSMENT AND  PLAN: *Positive Cologuard:  3/32/2022.  Reports having a colonoscopy "somewhere in [place]" 10 years ago and reports polyps removed but we do not have those records.  Will plan for colonoscopy at [facility] with Dr. Bryan Lemma due to being paraplegic and "limited mobility".  The risks, benefits, and alternatives to colonoscopy were discussed with the patient and he consents to proceed.   CC:  Hali Marry, *

## 2021-01-16 NOTE — Patient Instructions (Signed)
If you are age 61 or older, your body mass index should be between 23-30. Your Body mass index is 26.31 kg/m. If this is out of the aforementioned range listed, please consider follow up with your Primary Care Provider.  If you are age 79 or younger, your body mass index should be between 19-25. Your Body mass index is 26.31 kg/m. If this is out of the aformentioned range listed, please consider follow up with your Primary Care Provider.   You have been scheduled for a colonoscopy. Please follow written instructions given to you at your visit today.  Please pick up your prep supplies at the pharmacy within the next 1-3 days. If you use inhalers (even only as needed), please bring them with you on the day of your procedure.  It was a pleasure to see you today!  Thank you for trusting me with your gastrointestinal care!    Alonza Bogus, PA-C

## 2021-01-17 ENCOUNTER — Encounter: Payer: Self-pay | Admitting: Orthopaedic Surgery

## 2021-01-17 ENCOUNTER — Ambulatory Visit (INDEPENDENT_AMBULATORY_CARE_PROVIDER_SITE_OTHER): Payer: BC Managed Care – PPO | Admitting: Orthopaedic Surgery

## 2021-01-17 ENCOUNTER — Ambulatory Visit: Payer: Self-pay

## 2021-01-17 DIAGNOSIS — S72142D Displaced intertrochanteric fracture of left femur, subsequent encounter for closed fracture with routine healing: Secondary | ICD-10-CM

## 2021-01-17 NOTE — Progress Notes (Signed)
The patient is now almost 7 months status post open reduction/centralization of a complex left proximal femur fracture.  Even though the patient is paraplegic he was having significant spasms for this type of fracture so we stabilize it with an intramedullary nail and hip screw construct.  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  He denies any pain at all at this point.  I am able to easily put his left hip through internal and external rotation with no difficulties at all.  Note he is dealing with some bladder issues as well as has an upcoming colonoscopy with a positive Cologuard test.  X-rays today show further consolidation of the left proximal femur fracture.  I like to see him back in 5 months for final AP and lateral of the left proximal femur.  All questions and concerns were answered and addressed.

## 2021-01-23 ENCOUNTER — Other Ambulatory Visit: Payer: Self-pay

## 2021-01-23 DIAGNOSIS — R195 Other fecal abnormalities: Secondary | ICD-10-CM

## 2021-01-25 ENCOUNTER — Telehealth: Payer: Self-pay | Admitting: Gastroenterology

## 2021-01-25 NOTE — Telephone Encounter (Signed)
Inbound call from patient. States his procedure in hospital 5/19 should have been cancelled because he do not have any transportation. Patient is also calling to have it rescheduled. Best contact number [phone].

## 2021-01-25 NOTE — Telephone Encounter (Signed)
Returned call to pt advised him that there was no documentation that he had previously called to cancel or r/s, however I was returning his call to help. Pt has been r/s for 03/22/21 @ [facility] 8:30am/ TOA: 7:00am. Covid Test has been r/s to 7/5@ 10:25am. Instructions have been updated and sent to pt via mychart. Pt voiced understanding.

## 2021-01-26 NOTE — Progress Notes (Signed)
Agree with the assessment and plan as outlined by Jessica Zehr, PA-C. ? ?Raeshawn Tafolla, DO, FACG ? ?

## 2021-01-29 ENCOUNTER — Other Ambulatory Visit (HOSPITAL_COMMUNITY): Payer: BC Managed Care – PPO

## 2021-02-19 ENCOUNTER — Ambulatory Visit: Payer: BC Managed Care – PPO | Admitting: Urology

## 2021-02-19 ENCOUNTER — Telehealth: Payer: Self-pay

## 2021-02-19 ENCOUNTER — Other Ambulatory Visit: Payer: Self-pay

## 2021-02-19 ENCOUNTER — Ambulatory Visit (INDEPENDENT_AMBULATORY_CARE_PROVIDER_SITE_OTHER): Payer: BC Managed Care – PPO | Admitting: Urology

## 2021-02-19 ENCOUNTER — Encounter: Payer: Self-pay | Admitting: Urology

## 2021-02-19 VITALS — Ht 66.0 in

## 2021-02-19 DIAGNOSIS — N200 Calculus of kidney: Secondary | ICD-10-CM | POA: Diagnosis not present

## 2021-02-19 DIAGNOSIS — N319 Neuromuscular dysfunction of bladder, unspecified: Secondary | ICD-10-CM

## 2021-02-19 DIAGNOSIS — R310 Gross hematuria: Secondary | ICD-10-CM

## 2021-02-19 NOTE — Telephone Encounter (Signed)
Patient received a message to pick up contrast 1 day before imaging. He was told it was without contrast.  Patient wanting to make sure his imaging is without contrast.  Please advise.  Call back:  [phone] Jerilynn Mages)   Thanks, Helene Kelp

## 2021-02-19 NOTE — Telephone Encounter (Signed)
Called patient and confirmed with patient Ct is WITHOUT oral contrast. Pt will NOT pick up contrast. Patient voiced understanding.

## 2021-02-19 NOTE — Progress Notes (Signed)
Urological Symptom Review  Patient is experiencing the following symptoms: None   Review of Systems  Gastrointestinal (upper)  : Negative for upper GI symptoms  Gastrointestinal (lower) : Negative for lower GI symptoms  Constitutional : Negative for symptoms  Skin: Negative for skin symptoms  Eyes: Negative for eye symptoms  Ear/Nose/Throat : Negative for Ear/Nose/Throat symptoms  Hematologic/Lymphatic: Negative for Hematologic/Lymphatic symptoms  Cardiovascular : Negative for cardiovascular symptoms  Respiratory : Negative for respiratory symptoms none  Endocrine: Negative for endocrine symptoms  Musculoskeletal: Negative for musculoskeletal symptoms  Neurological: Negative for neurological symptoms  Psychologic: Negative for psychiatric symptoms
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
## 2021-02-26 ENCOUNTER — Other Ambulatory Visit (HOSPITAL_COMMUNITY): Payer: BC Managed Care – PPO

## 2021-03-15 NOTE — Progress Notes (Signed)
Attempted to obtain medical history via telephone, unable to reach at this time. I left a voicemail to return pre surgical testing department's phone call.  

## 2021-03-16 ENCOUNTER — Other Ambulatory Visit: Payer: Self-pay

## 2021-03-16 ENCOUNTER — Ambulatory Visit (HOSPITAL_COMMUNITY)
Admission: RE | Admit: 2021-03-16 | Discharge: 2021-03-16 | Disposition: A | Discharge status: Home / Self Care | Payer: BC Managed Care – PPO | Source: Ambulatory Visit | Attending: Urology | Admitting: Urology

## 2021-03-16 DIAGNOSIS — N261 Atrophy of kidney (terminal): Secondary | ICD-10-CM | POA: Diagnosis not present

## 2021-03-16 DIAGNOSIS — R310 Gross hematuria: Secondary | ICD-10-CM | POA: Insufficient documentation

## 2021-03-16 DIAGNOSIS — N1330 Unspecified hydronephrosis: Secondary | ICD-10-CM | POA: Diagnosis not present

## 2021-03-16 DIAGNOSIS — N200 Calculus of kidney: Secondary | ICD-10-CM | POA: Insufficient documentation

## 2021-03-16 DIAGNOSIS — N134 Hydroureter: Secondary | ICD-10-CM | POA: Diagnosis not present

## 2021-03-20 ENCOUNTER — Other Ambulatory Visit (HOSPITAL_COMMUNITY)
Admission: RE | Admit: 2021-03-20 | Discharge: 2021-03-20 | Disposition: A | Discharge status: Home / Self Care | Payer: BC Managed Care – PPO | Source: Ambulatory Visit | Attending: Gastroenterology | Admitting: Gastroenterology

## 2021-03-20 DIAGNOSIS — Z91040 Latex allergy status: Secondary | ICD-10-CM | POA: Diagnosis not present

## 2021-03-20 DIAGNOSIS — Z01812 Encounter for preprocedural laboratory examination: Secondary | ICD-10-CM | POA: Insufficient documentation

## 2021-03-20 DIAGNOSIS — Z882 Allergy status to sulfonamides status: Secondary | ICD-10-CM | POA: Diagnosis not present

## 2021-03-20 DIAGNOSIS — G8220 Paraplegia, unspecified: Secondary | ICD-10-CM | POA: Diagnosis not present

## 2021-03-20 DIAGNOSIS — Z20822 Contact with and (suspected) exposure to covid-19: Secondary | ICD-10-CM | POA: Insufficient documentation

## 2021-03-20 DIAGNOSIS — K621 Rectal polyp: Secondary | ICD-10-CM | POA: Diagnosis not present

## 2021-03-20 DIAGNOSIS — K648 Other hemorrhoids: Secondary | ICD-10-CM | POA: Diagnosis not present

## 2021-03-20 DIAGNOSIS — R195 Other fecal abnormalities: Secondary | ICD-10-CM | POA: Diagnosis not present

## 2021-03-20 DIAGNOSIS — K626 Ulcer of anus and rectum: Secondary | ICD-10-CM | POA: Diagnosis not present

## 2021-03-20 DIAGNOSIS — Z993 Dependence on wheelchair: Secondary | ICD-10-CM | POA: Diagnosis not present

## 2021-03-20 DIAGNOSIS — Z888 Allergy status to other drugs, medicaments and biological substances status: Secondary | ICD-10-CM | POA: Diagnosis not present

## 2021-03-21 LAB — SARS CORONAVIRUS 2 (TAT 6-24 HRS): SARS Coronavirus 2: NEGATIVE
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
## 2021-03-22 ENCOUNTER — Other Ambulatory Visit: Payer: Self-pay

## 2021-03-22 ENCOUNTER — Ambulatory Visit (HOSPITAL_COMMUNITY): Payer: BC Managed Care – PPO | Admitting: Anesthesiology

## 2021-03-22 ENCOUNTER — Ambulatory Visit (HOSPITAL_COMMUNITY)
Admission: RE | Admit: 2021-03-22 | Discharge: 2021-03-22 | Disposition: A | Discharge status: Home / Self Care | Payer: BC Managed Care – PPO | Attending: Gastroenterology | Admitting: Gastroenterology

## 2021-03-22 ENCOUNTER — Encounter (HOSPITAL_COMMUNITY)
Admission: RE | Disposition: A | Discharge status: Home / Self Care | Payer: Self-pay | Source: Home / Self Care | Attending: Gastroenterology

## 2021-03-22 ENCOUNTER — Encounter (HOSPITAL_COMMUNITY): Payer: Self-pay | Admitting: Gastroenterology

## 2021-03-22 DIAGNOSIS — Z20822 Contact with and (suspected) exposure to covid-19: Secondary | ICD-10-CM | POA: Insufficient documentation

## 2021-03-22 DIAGNOSIS — K621 Rectal polyp: Secondary | ICD-10-CM

## 2021-03-22 DIAGNOSIS — Z91040 Latex allergy status: Secondary | ICD-10-CM | POA: Insufficient documentation

## 2021-03-22 DIAGNOSIS — Z1211 Encounter for screening for malignant neoplasm of colon: Secondary | ICD-10-CM | POA: Diagnosis not present

## 2021-03-22 DIAGNOSIS — K641 Second degree hemorrhoids: Secondary | ICD-10-CM

## 2021-03-22 DIAGNOSIS — G8220 Paraplegia, unspecified: Secondary | ICD-10-CM | POA: Insufficient documentation

## 2021-03-22 DIAGNOSIS — Z993 Dependence on wheelchair: Secondary | ICD-10-CM | POA: Insufficient documentation

## 2021-03-22 DIAGNOSIS — Z882 Allergy status to sulfonamides status: Secondary | ICD-10-CM | POA: Insufficient documentation

## 2021-03-22 DIAGNOSIS — K648 Other hemorrhoids: Secondary | ICD-10-CM | POA: Diagnosis not present

## 2021-03-22 DIAGNOSIS — K626 Ulcer of anus and rectum: Secondary | ICD-10-CM

## 2021-03-22 DIAGNOSIS — R195 Other fecal abnormalities: Secondary | ICD-10-CM | POA: Diagnosis not present

## 2021-03-22 DIAGNOSIS — K635 Polyp of colon: Secondary | ICD-10-CM | POA: Diagnosis not present

## 2021-03-22 DIAGNOSIS — Z888 Allergy status to other drugs, medicaments and biological substances status: Secondary | ICD-10-CM | POA: Diagnosis not present

## 2021-03-22 DIAGNOSIS — E785 Hyperlipidemia, unspecified: Secondary | ICD-10-CM | POA: Diagnosis not present

## 2021-03-22 HISTORY — PX: POLYPECTOMY: SHX5525

## 2021-03-22 HISTORY — PX: COLONOSCOPY: SHX5424

## 2021-03-22 HISTORY — PX: BIOPSY: SHX5522

## 2021-03-22 SURGERY — COLONOSCOPY
Anesthesia: Monitor Anesthesia Care

## 2021-03-22 MED ORDER — PROPOFOL 500 MG/50ML IV EMUL
INTRAVENOUS | Status: DC | PRN
  Administered 2021-03-22: 150 ug/kg/min via INTRAVENOUS

## 2021-03-22 MED ORDER — PHENYLEPHRINE 40 MCG/ML (10ML) SYRINGE FOR IV PUSH (FOR BLOOD PRESSURE SUPPORT)
PREFILLED_SYRINGE | INTRAVENOUS | Status: DC | PRN
  Administered 2021-03-22 (×2): 120 ug via INTRAVENOUS

## 2021-03-22 MED ORDER — SODIUM CHLORIDE 0.9 % IV SOLN
INTRAVENOUS | Status: DC
Start: 2021-03-22 — End: 2021-03-22

## 2021-03-22 MED ORDER — LACTATED RINGERS IV SOLN: INTRAVENOUS | Status: DC | PRN

## 2021-03-22 MED ORDER — PROMETHAZINE HCL 25 MG/ML IJ SOLN: 6.2500 mg | INTRAMUSCULAR | Status: DC | PRN

## 2021-03-22 MED ORDER — PROPOFOL 10 MG/ML IV BOLUS
INTRAVENOUS | Status: DC | PRN
  Administered 2021-03-22: 50 mg via INTRAVENOUS

## 2021-03-22 MED ORDER — EPHEDRINE SULFATE-NACL 50-0.9 MG/10ML-% IV SOSY
PREFILLED_SYRINGE | INTRAVENOUS | Status: DC | PRN
  Administered 2021-03-22 (×2): 10 mg via INTRAVENOUS

## 2021-03-22 NOTE — Anesthesia Postprocedure Evaluation (Signed)
Anesthesia Post Note  Patient: Thomas Schroeder  Procedure(s) Performed: COLONOSCOPY BIOPSY POLYPECTOMY     Patient location during evaluation: PACU Anesthesia Type: MAC Level of consciousness: awake and alert and oriented Pain management: pain level controlled Vital Signs Assessment: post-procedure vital signs reviewed and stable Respiratory status: spontaneous breathing, nonlabored ventilation and respiratory function stable Cardiovascular status: blood pressure returned to baseline Postop Assessment: no apparent nausea or vomiting Anesthetic complications: no   No notable events documented.  Last Vitals:  Vitals:   03/22/21 0912 03/22/21 0918  BP: (!) 106/41 (!) 106/41  Pulse: 80 90  Resp: (!) 26 (!) 25  Temp:    SpO2: 100% 100%    Last Pain:  Vitals:   03/22/21 0918  TempSrc:   PainSc: 0-No pain                 Brennan Bailey

## 2021-03-22 NOTE — Discharge Instructions (Signed)

## 2021-03-22 NOTE — Transfer of Care (Signed)
Immediate Anesthesia Transfer of Care Note  Patient: Thomas Schroeder  Procedure(s) Performed: COLONOSCOPY BIOPSY POLYPECTOMY  Patient Location: endo  Anesthesia Type:MAC  Level of Consciousness: awake, alert  and oriented  Airway & Oxygen Therapy: Patient Spontanous Breathing and Patient connected to face mask  Post-op Assessment: Report given to RN and Post -op Vital signs reviewed and stable  Post vital signs: Reviewed and stable  Last Vitals:  Vitals Value Taken Time  BP 106/41 03/22/21 0912  Temp 36.8 C 03/22/21 0908  Pulse 86 03/22/21 0914  Resp 24 03/22/21 0914  SpO2 100 % 03/22/21 0914  Vitals shown include unvalidated device data.  Last Pain:  Vitals:   03/22/21 0908  TempSrc: Oral  PainSc: Asleep         Complications: No notable events documented.

## 2021-03-22 NOTE — H&P (Signed)
            Chief Complaint: Colon cancer screening, positive Cologuard  HPI:     Thomas Schroeder is a 60 y.o. male referred to the Gastroenterology Clinic in 01/2021 for evaluation of positive Cologuard.  He was seen by Alonza Bogus on 01/16/2021.  Last colonoscopy was 10 years ago in [place].  No report available for review, but patient thinks he may have had polyps removed.  He is otherwise without any GI symptoms.   Past Medical History:  Diagnosis Date   Allergy    Chronic indwelling Foley catheter    Chronic kidney disease    ED (erectile dysfunction)    History of recurrent UTIs    Night muscle spasms    Paraplegia ([place])      Past Surgical History:  Procedure Laterality Date   APPENDECTOMY     FEMUR FRACTURE SURGERY     INTRAMEDULLARY (IM) NAIL INTERTROCHANTERIC Left 06/24/2020   Procedure: INTRAMEDULLARY (IM) NAIL FEMORAL;  Surgeon: Mcarthur Rossetti, MD;  Location: [facility];  Service: Orthopedics;  Laterality: Left;   kidney stone removal     RT tibia fracture     T8 T9 fusion      TONSILLECTOMY     Family History  Problem Relation Age of Onset   Hyperlipidemia Mother    Diabetes Mother    Rosacea Mother    Lung cancer Mother        smoker   Hypertension Neg Hx    Social History   Tobacco Use   Smoking status: Never   Smokeless tobacco: Never  Vaping Use   Vaping Use: Never used  Substance Use Topics   Alcohol use: Yes    Alcohol/week: 10.0 standard drinks    Types: 10 Standard drinks or equivalent per week    Comment: per week   Current Facility-Administered Medications  Medication Dose Route Frequency Provider Last Rate Last Admin   0.9 %  sodium chloride infusion   Intravenous Continuous Zehr, Jessica D, PA-C       Allergies  Allergen Reactions   Bactrim Rash    Skin sloughing    Ropinirole Other (See Comments)    Increased leg spasms/poor sleep.    Latex Rash     Review of Systems: All systems reviewed and negative except where noted  in HPI.     Physical Exam:    Wt Readings from Last 3 Encounters:  01/16/21 73.9 kg  01/08/21 73.9 kg  12/05/20 76.8 kg    BP (!) 100/55   Temp 98.1 F (36.7 C) (Oral)   Resp 18   SpO2 96%  Constitutional:  Pleasant, in no acute distress. Psychiatric: Normal mood and affect. Behavior is normal. Cardiovascular: Normal rate, regular rhythm. No edema Pulmonary/chest: Effort normal and breath sounds normal. No wheezing, rales or rhonchi. Abdominal: Soft, nondistended, nontender. Bowel sounds active throughout. There are no masses palpable. No hepatomegaly. Neurological: Alert and oriented to person place and time.   ASSESSMENT AND PLAN;   1) Positive Cologuard 2) Paraplegia - Plan for colonoscopy today - Colonoscopy scheduled at [facility] due to paraplegia and limited mobility   [place], DO, FACG  03/22/2021, 7:50 AM   No ref. provider found

## 2021-03-22 NOTE — Op Note (Signed)
[facility] Patient Name: Thomas Schroeder Procedure Date: 03/22/2021 MRN: [id number] Attending MD: Gerrit Heck , MD Date of Birth: [birth year] CSN: [id number] Age: 60 Admit Type: Inpatient Procedure:                Colonoscopy Indications:              Positive Cologuard test                           Last colonoscopy was 10 years ago. Providers:                Gerrit Heck, MD, Mariana Arn, [place],                            Ladona Ridgel, Technician, Margurite Auerbach Referring MD:              Medicines:                Monitored Anesthesia Care Complications:            No immediate complications. Estimated Blood Loss:     Estimated blood loss was minimal. Procedure:                Pre-Anesthesia Assessment:                           - Prior to the procedure, a History and Physical                            was performed, and patient medications and                            allergies were reviewed. The patient's tolerance of                            previous anesthesia was also reviewed. The risks                            and benefits of the procedure and the sedation                            options and risks were discussed with the patient.                            All questions were answered, and informed consent                            was obtained. Prior Anticoagulants: The patient has                            taken no previous anticoagulant or antiplatelet                            agents except for aspirin. ASA Grade Assessment:                            III -  A patient with severe systemic disease. After                            reviewing the risks and benefits, the patient was                            deemed in satisfactory condition to undergo the                            procedure.                           After obtaining informed consent, the colonoscope                            was passed under direct vision. Throughout  the                            procedure, the patient's blood pressure, pulse, and                            oxygen saturations were monitored continuously. The                            CF-HQ190L ([id number] Olympus colonoscope was                            introduced through the anus and advanced to the the                            terminal ileum. The colonoscopy was performed                            without difficulty. The patient tolerated the                            procedure well. The quality of the bowel                            preparation was good. The terminal ileum, ileocecal                            valve, appendiceal orifice, and rectum were                            photographed. Scope In: 8:45:11 AM Scope Out: 9:01:30 AM Scope Withdrawal Time: 0 hours 12 minutes 13 seconds  Total Procedure Duration: 0 hours 16 minutes 19 seconds  Findings:      Hemorrhoids were found on perianal exam.      A 2 mm polyp was found in the rectum. The polyp was sessile. The polyp       was removed with a cold biopsy forceps. Resection and retrieval were       complete. Estimated blood loss was minimal.      A single (solitary) four mm  ulcer was found in the distal rectum. No       bleeding was present. No stigmata of recent bleeding were seen. Biopsies       were taken with a cold forceps for histology. Estimated blood loss was       minimal.      Non-bleeding internal hemorrhoids were found during retroflexion. The       hemorrhoids were small.      The terminal ileum appeared normal. Impression:               - Hemorrhoids found on perianal exam.                           - One 2 mm polyp in the rectum, removed with a cold                            biopsy forceps. Resected and retrieved.                           - A single (solitary) ulcer in the distal rectum.                            Biopsied.                           - Non-bleeding internal hemorrhoids.                            - The examined portion of the ileum was normal. Moderate Sedation:      Not Applicable - Patient had care per Anesthesia. Recommendation:           - Patient has a contact number available for                            emergencies. The signs and symptoms of potential                            delayed complications were discussed with the                            patient. Return to normal activities tomorrow.                            Written discharge instructions were provided to the                            patient.                           - Resume previous diet.                           - Continue present medications.                           - Await pathology results.                           -  Repeat colonoscopy in 5-10 years for surveillance                            based on pathology results.                           - Return to GI office PRN. Procedure Code(s):        --- Professional ---                           [phone], Colonoscopy, flexible; with biopsy, single                            or multiple Diagnosis Code(s):        --- Professional ---                           K64.8, Other hemorrhoids                           K62.1, Rectal polyp                           K62.6, Ulcer of anus and rectum                           R19.5, Other fecal abnormalities CPT copyright 2019 American Medical Association. All rights reserved. The codes documented in this report are preliminary and upon coder review may  be revised to meet current compliance requirements. Gerrit Heck, MD 03/22/2021 9:17:04 AM Number of Addenda: 0

## 2021-03-26 ENCOUNTER — Encounter: Payer: Self-pay | Admitting: Gastroenterology

## 2021-03-26 LAB — SURGICAL PATHOLOGY

## 2021-04-02 ENCOUNTER — Ambulatory Visit (INDEPENDENT_AMBULATORY_CARE_PROVIDER_SITE_OTHER): Payer: BC Managed Care – PPO | Admitting: Urology

## 2021-04-02 ENCOUNTER — Other Ambulatory Visit: Payer: Self-pay

## 2021-04-02 ENCOUNTER — Encounter: Payer: Self-pay | Admitting: Urology

## 2021-04-02 VITALS — BP 103/65 | HR 89

## 2021-04-02 DIAGNOSIS — R310 Gross hematuria: Secondary | ICD-10-CM | POA: Diagnosis not present

## 2021-04-02 DIAGNOSIS — N210 Calculus in bladder: Secondary | ICD-10-CM

## 2021-04-02 DIAGNOSIS — N1330 Unspecified hydronephrosis: Secondary | ICD-10-CM | POA: Diagnosis not present

## 2021-04-02 MED ORDER — CIPROFLOXACIN HCL 500 MG PO TABS
500.0000 mg | ORAL_TABLET | Freq: Once | ORAL | Status: AC
  Administered 2021-04-02: 500 mg via ORAL

## 2021-04-02 NOTE — Progress Notes (Signed)
Urological Symptom Review  Patient is experiencing the following symptoms: Blood in urine Urinary tract infection Injury to kidneys/bladder Erection problems (male only) Kidney stones   Review of Systems  Gastrointestinal (upper)  : Negative for upper GI symptoms  Gastrointestinal (lower) : Negative for lower GI symptoms  Constitutional : Night Sweats Weight loss Fatigue  Skin: Itching  Eyes: Negative for eye symptoms  Ear/Nose/Throat : Negative for Ear/Nose/Throat symptoms  Hematologic/Lymphatic: Negative for Hematologic/Lymphatic symptoms  Cardiovascular : Negative for cardiovascular symptoms  Respiratory : Negative for respiratory symptoms  Endocrine: Negative for endocrine symptoms  Musculoskeletal: Negative for musculoskeletal symptoms  Neurological: Negative for neurological symptoms  Psychologic: Negative for psychiatric symptoms
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
## 2021-04-03 ENCOUNTER — Telehealth: Payer: Self-pay

## 2021-04-03 LAB — BASIC METABOLIC PANEL
BUN/Creatinine Ratio: 16 (ref 10–24)
BUN: 43 mg/dL — ABNORMAL HIGH (ref 8–27)
CO2: 17 mmol/L — ABNORMAL LOW (ref 20–29)
Calcium: 10.3 mg/dL — ABNORMAL HIGH (ref 8.6–10.2)
Chloride: 101 mmol/L (ref 96–106)
Creatinine, Ser: 2.62 mg/dL — ABNORMAL HIGH (ref 0.76–1.27)
Glucose: 89 mg/dL (ref 65–99)
Potassium: 5.1 mmol/L (ref 3.5–5.2)
Sodium: 134 mmol/L (ref 134–144)
eGFR: 27 mL/min/{1.73_m2} — ABNORMAL LOW (ref >59)

## 2021-04-03 NOTE — Telephone Encounter (Signed)
Patient called and notified.  Surgery moved to 04/05/2021

## 2021-04-03 NOTE — Telephone Encounter (Signed)
-----   Message from Festus Aloe, MD sent at 04/03/2021 10:04 AM EDT ----- Please let patient know his right kidney (his good kidney) is blocked up from all this debris in his bladder and causing a significant rise in his creatinine. We need to get him to the OR ASAP. Dr. Alyson Ingles said he could try to proceed Thursday but I think even Friday or early next week (Mon or Tues would be OK). Thank you!   ----- Message ----- From: Dorisann Frames, RN Sent: 04/03/2021   7:51 AM EDT To: Festus Aloe, MD  Please review

## 2021-04-03 NOTE — Patient Instructions (Signed)
Thomas Schroeder  04/03/2021     @PREFPERIOPPHARMACY @   Your procedure is scheduled on  04/05/2021.   Report to [facility] at  1245  P.M.   Call this number if you have problems the morning of surgery:  [phone]   Remember:  Do not eat or drink after midnight.     Take these medicines the morning of surgery with A SIP OF WATER           NONE     Do not wear jewelry, make-up or nail polish.  Do not wear lotions, powders, or perfumes, or deodorant.  Do not shave 48 hours prior to surgery.  Men may shave face and neck.  Do not bring valuables to the hospital.  [facility] is not responsible for any belongings or valuables.  Contacts, dentures or bridgework may not be worn into surgery.  Leave your suitcase in the car.  After surgery it may be brought to your room.  For patients admitted to the hospital, discharge time will be determined by your treatment team.  Patients discharged the day of surgery will not be allowed to drive home and must have someone with them for 24 hours.    Special instructions:     DO NOT smoke tobacco or vape for 24 hours before your procedure.  Please read over the following fact sheets that you were given. Coughing and Deep Breathing, Surgical Site Infection Prevention, Anesthesia Post-op Instructions, and Care and Recovery After Surgery      Cystoscopy Cystoscopy is a procedure that is used to help diagnose and sometimes treat conditions that affect the lower urinary tract. The lower urinary tract includes the bladder and the urethra. The urethra is the tube that drains urine from the bladder. Cystoscopy is done using a thin, tube-shaped instrument with a light and camera at the end (cystoscope). The cystoscope may be hard or flexible, depending on the goal of theprocedure. The cystoscope is inserted through the urethra, into the bladder. Cystoscopy may be recommended if you have: Urinary tract infections that keep coming back. Blood in  the urine (hematuria). An inability to control when you urinate (urinary incontinence) or an overactive bladder. Unusual cells found in a urine sample. A blockage in the urethra, such as a urinary stone. Painful urination. An abnormality in the bladder found during an intravenous pyelogram (IVP) or CT scan. Cystoscopy may also be done to remove a sample of tissue to be examined under a microscope (biopsy). Tell a health care provider about: Any allergies you have. All medicines you are taking, including vitamins, herbs, eye drops, creams, and over-the-counter medicines. Any problems you or family members have had with anesthetic medicines. Any blood disorders you have. Any surgeries you have had. Any medical conditions you have. Whether you are pregnant or may be pregnant. What are the risks? Generally, this is a safe procedure. However, problems may occur, including: Infection. Bleeding. Allergic reactions to medicines. Damage to other structures or organs. What happens before the procedure? Medicines Ask your health care provider about: Changing or stopping your regular medicines. This is especially important if you are taking diabetes medicines or blood thinners. Taking medicines such as aspirin and ibuprofen. These medicines can thin your blood. Do not take these medicines unless your health care provider tells you to take them. Taking over-the-counter medicines, vitamins, herbs, and supplements. Tests You may have an exam or testing, such as: X-rays of the bladder, urethra,  or kidneys. CT scan of the abdomen or pelvis. Urine tests to check for signs of infection. General instructions Follow instructions from your health care provider about eating or drinking restrictions. Ask your health care provider what steps will be taken to help prevent infection. These steps may include: Washing skin with a germ-killing soap. Taking antibiotic medicine. Plan to have a responsible adult  take you home from the hospital or clinic. What happens during the procedure?  You will be given one or more of the following: A medicine to help you relax (sedative). A medicine to numb the area (local anesthetic). The area around the opening of your urethra will be cleaned. The cystoscope will be passed through your urethra into your bladder. Germ-free (sterile) fluid will flow through the cystoscope to fill your bladder. The fluid will stretch your bladder so that your health care provider can clearly examine your bladder walls. Your doctor will look at the urethra and bladder. Your doctor may take a biopsy or remove stones. The cystoscope will be removed, and your bladder will be emptied. The procedure may vary among health care providers and hospitals. What can I expect after the procedure? After the procedure, it is common to have: Some soreness or pain in your abdomen and urethra. Urinary symptoms. These include: Mild pain or burning when you urinate. Pain should stop within a few minutes after you urinate. This may last for up to 1 week. A small amount of blood in your urine for several days. Feeling like you need to urinate but producing only a small amount of urine. Follow these instructions at home: Medicines Take over-the-counter and prescription medicines only as told by your health care provider. If you were prescribed an antibiotic medicine, take it as told by your health care provider. Do not stop taking the antibiotic even if you start to feel better. General instructions Return to your normal activities as told by your health care provider. Ask your health care provider what activities are safe for you. If you were given a sedative during the procedure, it can affect you for several hours. Do not drive or operate machinery until your health care provider says that it is safe. Watch for any blood in your urine. If the amount of blood in your urine increases, call your  health care provider. Follow instructions from your health care provider about eating or drinking restrictions. If a tissue sample was removed for testing (biopsy) during your procedure, it is up to you to get your test results. Ask your health care provider, or the department that is doing the test, when your results will be ready. Drink enough fluid to keep your urine pale yellow. Keep all follow-up visits. This is important. Contact a health care provider if: You have pain that gets worse or does not get better with medicine, especially pain when you urinate. You have trouble urinating. You have more blood in your urine. Get help right away if: You have blood clots in your urine. You have abdominal pain. You have a fever or chills. You are unable to urinate. Summary Cystoscopy is a procedure that is used to help diagnose and sometimes treat conditions that affect the lower urinary tract. Cystoscopy is done using a thin, tube-shaped instrument with a light and camera at the end. After the procedure, it is common to have some soreness or pain in your abdomen and urethra. Watch for any blood in your urine. If the amount of blood in your urine  increases, call your health care provider. If you were prescribed an antibiotic medicine, take it as told by your health care provider. Do not stop taking the antibiotic even if you start to feel better. This information is not intended to replace advice given to you by your health care provider. Make sure you discuss any questions you have with your healthcare provider. Document Revised: 04/14/2020 Document Reviewed: 04/14/2020 Elsevier Patient Education  2022 [employer]. How to Use Chlorhexidine for Bathing Chlorhexidine gluconate (CHG) is a germ-killing (antiseptic) solution that is used to clean the skin. It can get rid of the bacteria that normally live on the skin and can keep them away for about 24 hours. To clean your skin with CHG, you may be  given: A CHG solution to use in the shower or as part of a sponge bath. A prepackaged cloth that contains CHG. Cleaning your skin with CHG may help lower the risk for infection: While you are staying in the intensive care unit of the hospital. If you have a vascular access, such as a central line, to provide short-term or long-term access to your veins. If you have a catheter to drain urine from your bladder. If you are on a ventilator. A ventilator is a machine that helps you breathe by moving air in and out of your lungs. After surgery. What are the risks? Risks of using CHG include: A skin reaction. Hearing loss, if CHG gets in your ears. Eye injury, if CHG gets in your eyes and is not rinsed out. The CHG product catching fire. Make sure that you avoid smoking and flames after applying CHG to your skin. Do not use CHG: If you have a chlorhexidine allergy or have previously reacted to chlorhexidine. On babies younger than 42 months of age. How to use CHG solution Use CHG only as told by your health care provider, and follow the instructions on the label. Use the full amount of CHG as directed. Usually, this is one bottle. During a shower Follow these steps when using CHG solution during a shower (unless your health care provider gives you different instructions): Start the shower. Use your normal soap and shampoo to wash your face and hair. Turn off the shower or move out of the shower stream. Pour the CHG onto a clean washcloth. Do not use any type of brush or rough-edged sponge. Starting at your neck, lather your body down to your toes. Make sure you follow these instructions: If you will be having surgery, pay special attention to the part of your body where you will be having surgery. Scrub this area for at least 1 minute. Do not use CHG on your head or face. If the solution gets into your ears or eyes, rinse them well with water. Avoid your genital area. Avoid any areas of skin  that have broken skin, cuts, or scrapes. Scrub your back and under your arms. Make sure to wash skin folds. Let the lather sit on your skin for 1-2 minutes or as long as told by your health care provider. Thoroughly rinse your entire body in the shower. Make sure that all body creases and crevices are rinsed well. Dry off with a clean towel. Do not put any substances on your body afterward--such as powder, lotion, or perfume--unless you are told to do so by your health care provider. Only use lotions that are recommended by the manufacturer. Put on clean clothes or pajamas. If it is the night before your surgery,  sleep in clean sheets.  During a sponge bath Follow these steps when using CHG solution during a sponge bath (unless your health care provider gives you different instructions): Use your normal soap and shampoo to wash your face and hair. Pour the CHG onto a clean washcloth. Starting at your neck, lather your body down to your toes. Make sure you follow these instructions: If you will be having surgery, pay special attention to the part of your body where you will be having surgery. Scrub this area for at least 1 minute. Do not use CHG on your head or face. If the solution gets into your ears or eyes, rinse them well with water. Avoid your genital area. Avoid any areas of skin that have broken skin, cuts, or scrapes. Scrub your back and under your arms. Make sure to wash skin folds. Let the lather sit on your skin for 1-2 minutes or as long as told by your health care provider. Using a different clean, wet washcloth, thoroughly rinse your entire body. Make sure that all body creases and crevices are rinsed well. Dry off with a clean towel. Do not put any substances on your body afterward--such as powder, lotion, or perfume--unless you are told to do so by your health care provider. Only use lotions that are recommended by the manufacturer. Put on clean clothes or pajamas. If it is the  night before your surgery, sleep in clean sheets. How to use CHG prepackaged cloths Only use CHG cloths as told by your health care provider, and follow the instructions on the label. Use the CHG cloth on clean, dry skin. Do not use the CHG cloth on your head or face unless your health care provider tells you to. When washing with the CHG cloth: Avoid your genital area. Avoid any areas of skin that have broken skin, cuts, or scrapes. Before surgery Follow these steps when using a CHG cloth to clean before surgery (unless your health care provider gives you different instructions): Using the CHG cloth, vigorously scrub the part of your body where you will be having surgery. Scrub using a back-and-forth motion for 3 minutes. The area on your body should be completely wet with CHG when you are done scrubbing. Do not rinse. Discard the cloth and let the area air-dry. Do not put any substances on the area afterward, such as powder, lotion, or perfume. Put on clean clothes or pajamas. If it is the night before your surgery, sleep in clean sheets.  For general bathing Follow these steps when using CHG cloths for general bathing (unless your health care provider gives you different instructions). Use a separate CHG cloth for each area of your body. Make sure you wash between any folds of skin and between your fingers and toes. Wash your body in the following order, switching to a new cloth after each step: The front of your neck, shoulders, and chest. Both of your arms, under your arms, and your hands. Your stomach and groin area, avoiding the genitals. Your right leg and foot. Your left leg and foot. The back of your neck, your back, and your buttocks. Do not rinse. Discard the cloth and let the area air-dry. Do not put any substances on your body afterward--such as powder, lotion, or perfume--unless you are told to do so by your health care provider. Only use lotions that are recommended by the  manufacturer. Put on clean clothes or pajamas. Contact a health care provider if: Your skin gets irritated  after scrubbing. You have questions about using your solution or cloth. Get help right away if: Your eyes become very red or swollen. Your eyes itch badly. Your skin itches badly and is red or swollen. Your hearing changes. You have trouble seeing. You have swelling or tingling in your mouth or throat. You have trouble breathing. You swallow any chlorhexidine. Summary Chlorhexidine gluconate (CHG) is a germ-killing (antiseptic) solution that is used to clean the skin. Cleaning your skin with CHG may help to lower your risk for infection. You may be given CHG to use for bathing. It may be in a bottle or in a prepackaged cloth to use on your skin. Carefully follow your health care provider's instructions and the instructions on the product label. Do not use CHG if you have a chlorhexidine allergy. Contact your health care provider if your skin gets irritated after scrubbing. This information is not intended to replace advice given to you by your health care provider. Make sure you discuss any questions you have with your healthcare provider. Document Revised: 01/14/2020 Document Reviewed: 02/18/2020 Elsevier Patient Education  [employer].

## 2021-04-04 ENCOUNTER — Other Ambulatory Visit: Payer: Self-pay

## 2021-04-04 ENCOUNTER — Encounter (HOSPITAL_COMMUNITY): Payer: Self-pay

## 2021-04-04 ENCOUNTER — Encounter (HOSPITAL_COMMUNITY)
Admission: RE | Admit: 2021-04-04 | Discharge: 2021-04-04 | Disposition: A | Discharge status: Home / Self Care | Payer: BC Managed Care – PPO | Source: Ambulatory Visit | Attending: Urology | Admitting: Urology

## 2021-04-04 DIAGNOSIS — F419 Anxiety disorder, unspecified: Secondary | ICD-10-CM | POA: Diagnosis not present

## 2021-04-04 DIAGNOSIS — E875 Hyperkalemia: Secondary | ICD-10-CM | POA: Diagnosis not present

## 2021-04-04 DIAGNOSIS — N132 Hydronephrosis with renal and ureteral calculous obstruction: Secondary | ICD-10-CM | POA: Diagnosis not present

## 2021-04-04 DIAGNOSIS — F1010 Alcohol abuse, uncomplicated: Secondary | ICD-10-CM | POA: Diagnosis not present

## 2021-04-04 DIAGNOSIS — Z01812 Encounter for preprocedural laboratory examination: Secondary | ICD-10-CM | POA: Insufficient documentation

## 2021-04-04 DIAGNOSIS — E871 Hypo-osmolality and hyponatremia: Secondary | ICD-10-CM | POA: Diagnosis not present

## 2021-04-04 DIAGNOSIS — N1330 Unspecified hydronephrosis: Secondary | ICD-10-CM | POA: Diagnosis not present

## 2021-04-04 DIAGNOSIS — N261 Atrophy of kidney (terminal): Secondary | ICD-10-CM | POA: Diagnosis not present

## 2021-04-04 DIAGNOSIS — M62838 Other muscle spasm: Secondary | ICD-10-CM | POA: Diagnosis not present

## 2021-04-04 DIAGNOSIS — L89152 Pressure ulcer of sacral region, stage 2: Secondary | ICD-10-CM | POA: Diagnosis not present

## 2021-04-04 DIAGNOSIS — N131 Hydronephrosis with ureteral stricture, not elsewhere classified: Secondary | ICD-10-CM | POA: Diagnosis not present

## 2021-04-04 DIAGNOSIS — D494 Neoplasm of unspecified behavior of bladder: Secondary | ICD-10-CM | POA: Diagnosis not present

## 2021-04-04 DIAGNOSIS — D62 Acute posthemorrhagic anemia: Secondary | ICD-10-CM | POA: Diagnosis not present

## 2021-04-04 DIAGNOSIS — Z452 Encounter for adjustment and management of vascular access device: Secondary | ICD-10-CM | POA: Diagnosis not present

## 2021-04-04 DIAGNOSIS — N319 Neuromuscular dysfunction of bladder, unspecified: Secondary | ICD-10-CM | POA: Diagnosis not present

## 2021-04-04 DIAGNOSIS — N139 Obstructive and reflux uropathy, unspecified: Secondary | ICD-10-CM | POA: Diagnosis not present

## 2021-04-04 DIAGNOSIS — Z7982 Long term (current) use of aspirin: Secondary | ICD-10-CM | POA: Diagnosis not present

## 2021-04-04 DIAGNOSIS — Z882 Allergy status to sulfonamides status: Secondary | ICD-10-CM | POA: Diagnosis not present

## 2021-04-04 DIAGNOSIS — Z981 Arthrodesis status: Secondary | ICD-10-CM | POA: Diagnosis not present

## 2021-04-04 DIAGNOSIS — Z83438 Family history of other disorder of lipoprotein metabolism and other lipidemia: Secondary | ICD-10-CM | POA: Diagnosis not present

## 2021-04-04 DIAGNOSIS — Z91040 Latex allergy status: Secondary | ICD-10-CM | POA: Diagnosis not present

## 2021-04-04 DIAGNOSIS — N179 Acute kidney failure, unspecified: Secondary | ICD-10-CM | POA: Diagnosis not present

## 2021-04-04 DIAGNOSIS — Z79899 Other long term (current) drug therapy: Secondary | ICD-10-CM | POA: Diagnosis not present

## 2021-04-04 DIAGNOSIS — C679 Malignant neoplasm of bladder, unspecified: Secondary | ICD-10-CM | POA: Diagnosis not present

## 2021-04-04 DIAGNOSIS — E785 Hyperlipidemia, unspecified: Secondary | ICD-10-CM | POA: Diagnosis not present

## 2021-04-04 DIAGNOSIS — Z87440 Personal history of urinary (tract) infections: Secondary | ICD-10-CM | POA: Diagnosis not present

## 2021-04-04 DIAGNOSIS — R310 Gross hematuria: Secondary | ICD-10-CM | POA: Diagnosis present

## 2021-04-04 DIAGNOSIS — G8220 Paraplegia, unspecified: Secondary | ICD-10-CM | POA: Diagnosis not present

## 2021-04-04 DIAGNOSIS — Z888 Allergy status to other drugs, medicaments and biological substances status: Secondary | ICD-10-CM | POA: Diagnosis not present

## 2021-04-04 LAB — BASIC METABOLIC PANEL
Anion gap: 9 (ref 5–15)
BUN: 49 mg/dL — ABNORMAL HIGH (ref 6–20)
CO2: 21 mmol/L — ABNORMAL LOW (ref 22–32)
Calcium: 9.7 mg/dL (ref 8.9–10.3)
Chloride: 102 mmol/L (ref 98–111)
Creatinine, Ser: 3.51 mg/dL — ABNORMAL HIGH (ref 0.61–1.24)
GFR, Estimated: 19 mL/min — ABNORMAL LOW (ref >60)
Glucose, Bld: 137 mg/dL — ABNORMAL HIGH (ref 70–99)
Potassium: 5.3 mmol/L — ABNORMAL HIGH (ref 3.5–5.1)
Sodium: 132 mmol/L — ABNORMAL LOW (ref 135–145)

## 2021-04-04 LAB — URINE CULTURE

## 2021-04-05 ENCOUNTER — Encounter (HOSPITAL_COMMUNITY): Payer: Self-pay | Admitting: Urology

## 2021-04-05 ENCOUNTER — Inpatient Hospital Stay (HOSPITAL_COMMUNITY)
Admission: RE | Admit: 2021-04-05 | Discharge: 2021-04-08 | DRG: 669 | Disposition: A | Discharge status: Home / Self Care | Payer: BC Managed Care – PPO | Attending: Internal Medicine | Admitting: Internal Medicine

## 2021-04-05 ENCOUNTER — Encounter (HOSPITAL_COMMUNITY)
Admission: RE | Disposition: A | Discharge status: Home / Self Care | Payer: Self-pay | Source: Home / Self Care | Attending: Internal Medicine

## 2021-04-05 ENCOUNTER — Ambulatory Visit (HOSPITAL_COMMUNITY): Payer: BC Managed Care – PPO | Admitting: Anesthesiology

## 2021-04-05 ENCOUNTER — Ambulatory Visit (HOSPITAL_COMMUNITY): Payer: BC Managed Care – PPO

## 2021-04-05 ENCOUNTER — Other Ambulatory Visit: Payer: Self-pay

## 2021-04-05 DIAGNOSIS — E871 Hypo-osmolality and hyponatremia: Secondary | ICD-10-CM | POA: Diagnosis present

## 2021-04-05 DIAGNOSIS — E875 Hyperkalemia: Secondary | ICD-10-CM | POA: Diagnosis present

## 2021-04-05 DIAGNOSIS — N131 Hydronephrosis with ureteral stricture, not elsewhere classified: Secondary | ICD-10-CM | POA: Diagnosis present

## 2021-04-05 DIAGNOSIS — Z87440 Personal history of urinary (tract) infections: Secondary | ICD-10-CM

## 2021-04-05 DIAGNOSIS — Z888 Allergy status to other drugs, medicaments and biological substances status: Secondary | ICD-10-CM

## 2021-04-05 DIAGNOSIS — Z882 Allergy status to sulfonamides status: Secondary | ICD-10-CM

## 2021-04-05 DIAGNOSIS — G8220 Paraplegia, unspecified: Secondary | ICD-10-CM | POA: Diagnosis not present

## 2021-04-05 DIAGNOSIS — Z91040 Latex allergy status: Secondary | ICD-10-CM | POA: Diagnosis not present

## 2021-04-05 DIAGNOSIS — L89152 Pressure ulcer of sacral region, stage 2: Secondary | ICD-10-CM | POA: Diagnosis present

## 2021-04-05 DIAGNOSIS — N179 Acute kidney failure, unspecified: Secondary | ICD-10-CM

## 2021-04-05 DIAGNOSIS — Z981 Arthrodesis status: Secondary | ICD-10-CM

## 2021-04-05 DIAGNOSIS — Z79899 Other long term (current) drug therapy: Secondary | ICD-10-CM | POA: Diagnosis not present

## 2021-04-05 DIAGNOSIS — D494 Neoplasm of unspecified behavior of bladder: Secondary | ICD-10-CM | POA: Diagnosis not present

## 2021-04-05 DIAGNOSIS — M62838 Other muscle spasm: Secondary | ICD-10-CM | POA: Diagnosis present

## 2021-04-05 DIAGNOSIS — N319 Neuromuscular dysfunction of bladder, unspecified: Secondary | ICD-10-CM | POA: Diagnosis present

## 2021-04-05 DIAGNOSIS — F1010 Alcohol abuse, uncomplicated: Secondary | ICD-10-CM | POA: Diagnosis present

## 2021-04-05 DIAGNOSIS — F419 Anxiety disorder, unspecified: Secondary | ICD-10-CM | POA: Diagnosis present

## 2021-04-05 DIAGNOSIS — R310 Gross hematuria: Secondary | ICD-10-CM | POA: Diagnosis present

## 2021-04-05 DIAGNOSIS — E785 Hyperlipidemia, unspecified: Secondary | ICD-10-CM | POA: Diagnosis present

## 2021-04-05 DIAGNOSIS — D62 Acute posthemorrhagic anemia: Secondary | ICD-10-CM | POA: Diagnosis present

## 2021-04-05 DIAGNOSIS — Z83438 Family history of other disorder of lipoprotein metabolism and other lipidemia: Secondary | ICD-10-CM | POA: Diagnosis not present

## 2021-04-05 DIAGNOSIS — Z7982 Long term (current) use of aspirin: Secondary | ICD-10-CM

## 2021-04-05 DIAGNOSIS — C679 Malignant neoplasm of bladder, unspecified: Secondary | ICD-10-CM | POA: Diagnosis present

## 2021-04-05 DIAGNOSIS — N139 Obstructive and reflux uropathy, unspecified: Secondary | ICD-10-CM | POA: Diagnosis not present

## 2021-04-05 DIAGNOSIS — N261 Atrophy of kidney (terminal): Secondary | ICD-10-CM | POA: Diagnosis present

## 2021-04-05 DIAGNOSIS — L8990 Pressure ulcer of unspecified site, unspecified stage: Secondary | ICD-10-CM | POA: Insufficient documentation

## 2021-04-05 DIAGNOSIS — N1330 Unspecified hydronephrosis: Secondary | ICD-10-CM | POA: Diagnosis not present

## 2021-04-05 DIAGNOSIS — Z872 Personal history of diseases of the skin and subcutaneous tissue: Secondary | ICD-10-CM

## 2021-04-05 DIAGNOSIS — Z789 Other specified health status: Secondary | ICD-10-CM

## 2021-04-05 DIAGNOSIS — Z7289 Other problems related to lifestyle: Secondary | ICD-10-CM

## 2021-04-05 HISTORY — PX: TRANSURETHRAL RESECTION OF BLADDER TUMOR: SHX2575

## 2021-04-05 HISTORY — PX: CYSTOSCOPY W/ RETROGRADES: SHX1426

## 2021-04-05 LAB — POCT I-STAT, CHEM 8
BUN: 50 mg/dL — ABNORMAL HIGH (ref 6–20)
Calcium, Ion: 1.31 mmol/L (ref 1.15–1.40)
Chloride: 105 mmol/L (ref 98–111)
Creatinine, Ser: 4.2 mg/dL — ABNORMAL HIGH (ref 0.61–1.24)
Glucose, Bld: 116 mg/dL — ABNORMAL HIGH (ref 70–99)
HCT: 21 % — ABNORMAL LOW (ref 39.0–52.0)
Hemoglobin: 7.1 g/dL — ABNORMAL LOW (ref 13.0–17.0)
Potassium: 4.7 mmol/L (ref 3.5–5.1)
Sodium: 132 mmol/L — ABNORMAL LOW (ref 135–145)
TCO2: 18 mmol/L — ABNORMAL LOW (ref 22–32)

## 2021-04-05 LAB — BASIC METABOLIC PANEL
Anion gap: 14 (ref 5–15)
BUN: 54 mg/dL — ABNORMAL HIGH (ref 6–20)
CO2: 17 mmol/L — ABNORMAL LOW (ref 22–32)
Calcium: 9.8 mg/dL (ref 8.9–10.3)
Chloride: 100 mmol/L (ref 98–111)
Creatinine, Ser: 4.3 mg/dL — ABNORMAL HIGH (ref 0.61–1.24)
GFR, Estimated: 15 mL/min — ABNORMAL LOW (ref >60)
Glucose, Bld: 79 mg/dL (ref 70–99)
Potassium: 4.9 mmol/L (ref 3.5–5.1)
Sodium: 131 mmol/L — ABNORMAL LOW (ref 135–145)

## 2021-04-05 LAB — RETICULOCYTES
Immature Retic Fract: 20.6 % — ABNORMAL HIGH (ref 2.3–15.9)
RBC.: 3.26 MIL/uL — ABNORMAL LOW (ref 4.22–5.81)
Retic Count, Absolute: 54.4 10*3/uL (ref 19.0–186.0)
Retic Ct Pct: 1.7 % (ref 0.4–3.1)

## 2021-04-05 LAB — FERRITIN: Ferritin: 579 ng/mL — ABNORMAL HIGH (ref 24–336)

## 2021-04-05 LAB — CBC
HCT: 25.5 % — ABNORMAL LOW (ref 39.0–52.0)
Hemoglobin: 7.2 g/dL — ABNORMAL LOW (ref 13.0–17.0)
MCH: 23.1 pg — ABNORMAL LOW (ref 26.0–34.0)
MCHC: 28.2 g/dL — ABNORMAL LOW (ref 30.0–36.0)
MCV: 81.7 fL (ref 80.0–100.0)
Platelets: 387 10*3/uL (ref 150–400)
RBC: 3.12 MIL/uL — ABNORMAL LOW (ref 4.22–5.81)
RDW: 17.3 % — ABNORMAL HIGH (ref 11.5–15.5)
WBC: 11.8 10*3/uL — ABNORMAL HIGH (ref 4.0–10.5)
nRBC: 0 % (ref 0.0–0.2)

## 2021-04-05 LAB — URINALYSIS, ROUTINE W REFLEX MICROSCOPIC
Bilirubin Urine: NEGATIVE
Glucose, UA: NEGATIVE mg/dL
Ketones, ur: NEGATIVE mg/dL
Nitrite: NEGATIVE
Protein, ur: 30 mg/dL — AB
Specific Gravity, Urine: 1.005 (ref 1.005–1.030)
WBC, UA: >50 WBC/hpf — ABNORMAL HIGH (ref 0–5)
pH: 6 (ref 5.0–8.0)

## 2021-04-05 LAB — PROTIME-INR
INR: 1.2 (ref 0.8–1.2)
Prothrombin Time: 15.7 seconds — ABNORMAL HIGH (ref 11.4–15.2)

## 2021-04-05 LAB — IRON AND TIBC
Iron: 15 ug/dL — ABNORMAL LOW (ref 45–182)
Saturation Ratios: 7 % — ABNORMAL LOW (ref 17.9–39.5)
TIBC: 209 ug/dL — ABNORMAL LOW (ref 250–450)
UIBC: 194 ug/dL

## 2021-04-05 LAB — FOLATE: Folate: 18.8 ng/mL (ref >5.9)

## 2021-04-05 LAB — VITAMIN B12: Vitamin B-12: 486 pg/mL (ref 180–914)

## 2021-04-05 LAB — ABO/RH: ABO/RH(D): A NEG

## 2021-04-05 LAB — PREPARE RBC (CROSSMATCH)

## 2021-04-05 SURGERY — CYSTOSCOPY, WITH RETROGRADE PYELOGRAM
Anesthesia: General

## 2021-04-05 MED ORDER — VANCOMYCIN HCL 10 G IV SOLR: 1.0000 mg | INTRAVENOUS | Status: DC

## 2021-04-05 MED ORDER — FENTANYL CITRATE (PF) 100 MCG/2ML IJ SOLN
INTRAMUSCULAR | Status: AC
  Filled 2021-04-05: qty 2

## 2021-04-05 MED ORDER — ATORVASTATIN CALCIUM 20 MG PO TABS
20.0000 mg | ORAL_TABLET | Freq: Every day | ORAL | Status: DC
  Administered 2021-04-06 – 2021-04-08 (×3): 20 mg via ORAL
  Filled 2021-04-05 (×5): qty 1

## 2021-04-05 MED ORDER — GENTAMICIN SULFATE 40 MG/ML IJ SOLN
5.0000 mg/kg | INTRAVENOUS | Status: AC
  Administered 2021-04-05: 340 mg via INTRAVENOUS
  Filled 2021-04-05: qty 8.5

## 2021-04-05 MED ORDER — PROPOFOL 10 MG/ML IV BOLUS
INTRAVENOUS | Status: AC
  Filled 2021-04-05: qty 20

## 2021-04-05 MED ORDER — LIDOCAINE HCL (PF) 2 % IJ SOLN
INTRAMUSCULAR | Status: AC
  Filled 2021-04-05: qty 5

## 2021-04-05 MED ORDER — SODIUM CHLORIDE 0.9 % IR SOLN
Status: DC | PRN
  Administered 2021-04-05 (×3): 3000 mL

## 2021-04-05 MED ORDER — MIDAZOLAM HCL 2 MG/2ML IJ SOLN
INTRAMUSCULAR | Status: AC
  Filled 2021-04-05: qty 2

## 2021-04-05 MED ORDER — ORAL CARE MOUTH RINSE: 15.0000 mL | Freq: Once | OROMUCOSAL | Status: AC

## 2021-04-05 MED ORDER — SODIUM CHLORIDE 0.9 % IV SOLN: INTRAVENOUS | Status: DC

## 2021-04-05 MED ORDER — ONDANSETRON HCL 4 MG/2ML IJ SOLN: 4.0000 mg | Freq: Once | INTRAMUSCULAR | Status: DC | PRN

## 2021-04-05 MED ORDER — CHLORHEXIDINE GLUCONATE 0.12 % MT SOLN
15.0000 mL | Freq: Once | OROMUCOSAL | Status: AC
  Administered 2021-04-05: 15 mL via OROMUCOSAL
  Filled 2021-04-05: qty 15

## 2021-04-05 MED ORDER — VANCOMYCIN HCL IN DEXTROSE 1-5 GM/200ML-% IV SOLN
INTRAVENOUS | Status: AC
  Filled 2021-04-05: qty 200

## 2021-04-05 MED ORDER — DEXAMETHASONE SODIUM PHOSPHATE 10 MG/ML IJ SOLN
INTRAMUSCULAR | Status: AC
  Filled 2021-04-05: qty 1

## 2021-04-05 MED ORDER — VANCOMYCIN HCL IN DEXTROSE 1-5 GM/200ML-% IV SOLN
1000.0000 mg | INTRAVENOUS | Status: AC
  Administered 2021-04-05: 1000 mg via INTRAVENOUS

## 2021-04-05 MED ORDER — PHENYLEPHRINE 40 MCG/ML (10ML) SYRINGE FOR IV PUSH (FOR BLOOD PRESSURE SUPPORT)
PREFILLED_SYRINGE | INTRAVENOUS | Status: AC
  Filled 2021-04-05: qty 10

## 2021-04-05 MED ORDER — DEXAMETHASONE SODIUM PHOSPHATE 4 MG/ML IJ SOLN
INTRAMUSCULAR | Status: DC | PRN
  Administered 2021-04-05: 4 mg via INTRAVENOUS

## 2021-04-05 MED ORDER — PHENYLEPHRINE 40 MCG/ML (10ML) SYRINGE FOR IV PUSH (FOR BLOOD PRESSURE SUPPORT)
PREFILLED_SYRINGE | INTRAVENOUS | Status: DC | PRN
  Administered 2021-04-05 (×2): 80 ug via INTRAVENOUS
  Administered 2021-04-05: 120 ug via INTRAVENOUS
  Administered 2021-04-05: 80 ug via INTRAVENOUS

## 2021-04-05 MED ORDER — GLYCOPYRROLATE PF 0.2 MG/ML IJ SOSY
PREFILLED_SYRINGE | INTRAMUSCULAR | Status: DC | PRN
  Administered 2021-04-05: .1 mg via INTRAVENOUS

## 2021-04-05 MED ORDER — OMEGA-3-ACID ETHYL ESTERS 1 G PO CAPS
1.0000 g | ORAL_CAPSULE | Freq: Every day | ORAL | Status: DC
  Administered 2021-04-06 – 2021-04-08 (×3): 1 g via ORAL
  Filled 2021-04-05 (×4): qty 1

## 2021-04-05 MED ORDER — FENTANYL CITRATE (PF) 100 MCG/2ML IJ SOLN: 25.0000 ug | INTRAMUSCULAR | Status: DC | PRN

## 2021-04-05 MED ORDER — EPHEDRINE SULFATE-NACL 50-0.9 MG/10ML-% IV SOSY
PREFILLED_SYRINGE | INTRAVENOUS | Status: DC | PRN
  Administered 2021-04-05 (×4): 5 mg via INTRAVENOUS

## 2021-04-05 MED ORDER — EPHEDRINE 5 MG/ML INJ
INTRAVENOUS | Status: AC
  Filled 2021-04-05: qty 5

## 2021-04-05 MED ORDER — SODIUM CHLORIDE 0.9% IV SOLUTION: Freq: Once | INTRAVENOUS | Status: AC

## 2021-04-05 MED ORDER — NEPRO/CARBSTEADY PO LIQD
237.0000 mL | Freq: Two times a day (BID) | ORAL | Status: DC
  Administered 2021-04-06: 237 mL via ORAL
  Filled 2021-04-05 (×2): qty 237

## 2021-04-05 MED ORDER — ONDANSETRON HCL 4 MG/2ML IJ SOLN
INTRAMUSCULAR | Status: AC
  Filled 2021-04-05: qty 2

## 2021-04-05 MED ORDER — PROPOFOL 10 MG/ML IV BOLUS
INTRAVENOUS | Status: DC | PRN
  Administered 2021-04-05: 170 mg via INTRAVENOUS
  Administered 2021-04-05: 30 mg via INTRAVENOUS

## 2021-04-05 MED ORDER — ACETAMINOPHEN 500 MG PO TABS
500.0000 mg | ORAL_TABLET | Freq: Four times a day (QID) | ORAL | Status: DC | PRN
  Administered 2021-04-07 – 2021-04-08 (×3): 500 mg via ORAL
  Filled 2021-04-05 (×3): qty 1

## 2021-04-05 MED ORDER — FENTANYL CITRATE (PF) 100 MCG/2ML IJ SOLN
INTRAMUSCULAR | Status: DC | PRN
  Administered 2021-04-05: 25 ug via INTRAVENOUS
  Administered 2021-04-05: 75 ug via INTRAVENOUS

## 2021-04-05 MED ORDER — GLYCOPYRROLATE PF 0.2 MG/ML IJ SOSY
PREFILLED_SYRINGE | INTRAMUSCULAR | Status: AC
  Filled 2021-04-05: qty 1

## 2021-04-05 MED ORDER — DIATRIZOATE MEGLUMINE 30 % UR SOLN
URETHRAL | Status: AC
  Filled 2021-04-05: qty 100

## 2021-04-05 MED ORDER — ONDANSETRON HCL 4 MG/2ML IJ SOLN
INTRAMUSCULAR | Status: DC | PRN
  Administered 2021-04-05: 4 mg via INTRAVENOUS

## 2021-04-05 MED ORDER — STERILE WATER FOR IRRIGATION IR SOLN
Status: DC | PRN
  Administered 2021-04-05: 1000 mL

## 2021-04-05 MED ORDER — SOD PICOSULFATE-MAG OX-CIT ACD 10-3.5-12 MG-GM -GM/160ML PO SOLN: 1.0000 | ORAL | Status: DC

## 2021-04-05 MED ORDER — MIDAZOLAM HCL 5 MG/5ML IJ SOLN
INTRAMUSCULAR | Status: DC | PRN
  Administered 2021-04-05: 2 mg via INTRAVENOUS

## 2021-04-05 MED ORDER — LIDOCAINE 2% (20 MG/ML) 5 ML SYRINGE
INTRAMUSCULAR | Status: DC | PRN
  Administered 2021-04-05: 100 mg via INTRAVENOUS

## 2021-04-05 SURGICAL SUPPLY — 29 items
BAG DRAIN URO TABLE W/ADPT NS (BAG) ×3 IMPLANT
BAG HAMPER (MISCELLANEOUS) ×3 IMPLANT
BAG URINE DRAIN 2000ML AR STRL (UROLOGICAL SUPPLIES) ×3 IMPLANT
CATH FOLEY 3WAY 30CC 22F (CATHETERS) IMPLANT
CATH FOLEY LATEX FREE 22FR (CATHETERS) ×1
CATH FOLEY LF 22FR (CATHETERS) ×2 IMPLANT
CATH INTERMIT  6FR 70CM (CATHETERS) ×3 IMPLANT
CLOTH BEACON ORANGE TIMEOUT ST (SAFETY) ×3 IMPLANT
CNTNR URN SCR LID CUP LEK RST (MISCELLANEOUS) ×2 IMPLANT
CONT SPEC 4OZ STRL OR WHT (MISCELLANEOUS) ×1
ELECT LOOP 22F BIPOLAR SML (ELECTROSURGICAL) ×3
ELECTRODE LOOP 22F BIPOLAR SML (ELECTROSURGICAL) ×2 IMPLANT
GLOVE BIO SURGEON STRL SZ8 (GLOVE) ×3 IMPLANT
GLOVE SURG UNDER POLY LF SZ7 (GLOVE) ×6 IMPLANT
GOWN STRL REUS W/TWL LRG LVL3 (GOWN DISPOSABLE) ×3 IMPLANT
GOWN STRL REUS W/TWL XL LVL3 (GOWN DISPOSABLE) ×3 IMPLANT
GUIDEWIRE STR DUAL SENSOR (WIRE) ×3 IMPLANT
IV NS IRRIG 3000ML ARTHROMATIC (IV SOLUTION) ×9 IMPLANT
KIT TURNOVER CYSTO (KITS) ×3 IMPLANT
MANIFOLD NEPTUNE II (INSTRUMENTS) ×3 IMPLANT
NEEDLE HYPO 18GX1.5 BLUNT FILL (NEEDLE) ×3 IMPLANT
PACK CYSTO (CUSTOM PROCEDURE TRAY) ×3 IMPLANT
PAD ARMBOARD 7.5X6 YLW CONV (MISCELLANEOUS) ×3 IMPLANT
SYR 20ML LL LF (SYRINGE) ×3 IMPLANT
SYR 30ML LL (SYRINGE) ×3 IMPLANT
SYR TOOMEY IRRIG 70ML (MISCELLANEOUS) ×3
SYRINGE TOOMEY IRRIG 70ML (MISCELLANEOUS) ×2 IMPLANT
TOWEL OR 17X26 4PK STRL BLUE (TOWEL DISPOSABLE) ×3 IMPLANT
WATER STERILE IRR 500ML POUR (IV SOLUTION) ×3 IMPLANT

## 2021-04-05 NOTE — Progress Notes (Signed)
Significant other [name] notified in waiting room of patient in Recovery, waiting for transfer.

## 2021-04-05 NOTE — Consult Note (Signed)
[facility] Nurse Consult Note: Reason for Consult: Bedside RN, S. Lelon Frohlich, Wound Treatment Associate requests orders for mattress replacement and topical care of Stage 2 pressure injury to patient's sacrum  Wound type: Pressure Pressure Injury POA: Yes Measurement:2.5cm x 2cm x 0.1cm   Wound MOQ:HUTM, moist Drainage (amount, consistency, odor) small Periwound:intact Dressing procedure/placement/frequency:Mattress replacement ordered earlier.  Patient turning side to side.  Topical care orders provided using a silver hydrofiber (Aquacel Ag+, Kellie Simmering [phone]) topped with dry gauze and covered with a silicone foam.  [facility] team will not follow, but will remain available to this patient, the nursing and medical teams.  Please re-consult if needed. Thanks, Maudie Flakes, MSN, RN, [place], Arther Abbott  Pager# [phone]

## 2021-04-05 NOTE — Progress Notes (Signed)
Interventional Radiology Brief Note:  Consult received for R PCN placement in solitary right kidney with hydronephrosis.  Patient to be transferred from [facility] to [facility].  NPO p MN.  Brynda Greathouse, MS RD PA-C

## 2021-04-05 NOTE — Interval H&P Note (Signed)
History and Physical Interval Note:  04/05/2021 1:14 PM  Thomas Schroeder  has presented today for surgery, with the diagnosis of bladder stone/debris, bilateral hydronephrosis.  The various methods of treatment have been discussed with the patient and family. After consideration of risks, benefits and other options for treatment, the patient has consented to  Procedure(s): CYSTOSCOPY WITH RETROGRADE PYELOGRAM- bladder evacuation of debris (Bilateral) as a surgical intervention.  The patient's history has been reviewed, patient examined, no change in status, stable for surgery.  I have reviewed the patient's chart and labs.  Questions were answered to the patient's satisfaction.     Nicolette Bang

## 2021-04-05 NOTE — Anesthesia Postprocedure Evaluation (Signed)
Anesthesia Post Note  Patient: Thomas Schroeder  Procedure(s) Performed: CYSTOSCOPY (Bilateral) TRANSURETHRAL RESECTION OF BLADDER TUMOR (TURBT)  Patient location during evaluation: PACU Anesthesia Type: General Level of consciousness: awake and alert and oriented Pain management: pain level controlled Vital Signs Assessment: post-procedure vital signs reviewed and stable Respiratory status: spontaneous breathing and respiratory function stable Cardiovascular status: blood pressure returned to baseline and stable Postop Assessment: no apparent nausea or vomiting Anesthetic complications: no   No notable events documented.   Last Vitals:  Vitals:   04/05/21 1500 04/05/21 1515  BP: (!) 112/57 109/74  Pulse: 80 77  Resp: 15 19  Temp:    SpO2: 100% 100%    Last Pain:  Vitals:   04/05/21 1515  TempSrc:   PainSc: 0-No pain                 Liliah Dorian C Aalijah Mims

## 2021-04-05 NOTE — H&P (Signed)
TRH H&P   Patient Demographics:    Thomas Schroeder, is a 60 y.o. male  MRN: [id number]   DOB - June 27, 1961  Admit Date - 04/05/2021  Outpatient Primary MD for the patient is Thomas Marry, MD  Referring MD/NP/PA: Dr Thyra Breed from urology.  Outpatient Specialists: urology Dr Benita Stabile  Patient coming from: from [facility] PACU  No chief complaint on file.     HPI:    Thomas Schroeder  is a 60 y.o. 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 -Patient was seen in PACU, level labs showing worsening creatinine function over last couple weeks, most recent 4.2, potassium of 4.7, i-STAT H&H was obtained with hemoglobin of 7.1, patient with hematuria as well, further labs are pending.    Review of systems:    In addition to the HPI above,  No Fever-chills, No Headache, No changes with Vision or hearing, No problems swallowing food or Liquids, No Chest pain, Cough or Shortness of Breath, No Abdominal pain, No  Nausea or Vommitting, BM with digital stimulation only. Reports hematuria, has chronic Foley No new skin rashes or bruises, No new joints pains-aches,  No new weakness, tingling, numbness in any extremity, No recent weight gain or loss, No polyuria, polydypsia or polyphagia, No significant Mental Stressors.  A full 10 point Review of Systems was done, except as stated above, all other Review of Systems were negative.   With Past History of the following :    Past Medical History:  Diagnosis Date   Allergy    Chronic indwelling Foley catheter    Chronic kidney disease    ED (erectile dysfunction)    History of recurrent UTIs    Night muscle spasms    Paraplegia Thomas Schroeder)       Past Surgical History:  Procedure Laterality Date   APPENDECTOMY     BIOPSY  03/22/2021   Procedure: BIOPSY;  Surgeon: Lavena Bullion, DO;  Location: [facility];  Service: Gastroenterology;;   COLONOSCOPY N/A 03/22/2021   Procedure: COLONOSCOPY;  Surgeon: Lavena Bullion, DO;  Location: [facility];  Service: Gastroenterology;  Laterality: N/A;   FEMUR  FRACTURE SURGERY     INTRAMEDULLARY (IM) NAIL INTERTROCHANTERIC Left 06/24/2020   Procedure: INTRAMEDULLARY (IM) NAIL FEMORAL;  Surgeon: Mcarthur Rossetti, MD;  Location: [facility];  Service: Orthopedics;  Laterality: Left;   kidney stone removal     POLYPECTOMY  03/22/2021   Procedure: POLYPECTOMY;  Surgeon: Lavena Bullion, DO;  Location: [facility];  Service: Gastroenterology;;   RT tibia fracture     T8 T9 fusion      TONSILLECTOMY        Social History:     Social History   Tobacco Use   Smoking status: Never   Smokeless tobacco: Never  Substance Use Topics   Alcohol use: Yes    Alcohol/week: 10.0 standard drinks    Types: 10 Standard drinks or equivalent per week    Comment: per week     Lives -at home with wife  Mobility -paraplegic    Family History :     Family History  Problem Relation Age of Onset    Hyperlipidemia Mother    Diabetes Mother    Rosacea Mother    Lung cancer Mother        smoker   Hypertension Neg Hx      Home Medications:   Prior to Admission medications   Medication Sig Start Date End Date Taking? Authorizing Provider  acetaminophen (TYLENOL) 500 MG tablet Take 1,000 mg by mouth every 6 (six) hours as needed for moderate pain or headache.   Yes [provider]  aspirin 325 MG tablet Take 325 mg by mouth daily.   Yes [provider]  atorvastatin (LIPITOR) 20 MG tablet TAKE 1 TABLET AT BEDTIME Patient taking differently: Take 20 mg by mouth daily. 11/21/20  Yes Thomas Marry, MD  CRANBERRY EXTRACT PO Take 4,200 mg by mouth daily.   Yes [provider]  Multiple Vitamin (MULTI VITAMIN MENS PO) Take 1 tablet by mouth daily.   Yes [provider]  naproxen sodium (ALEVE) 220 MG tablet Take 440 mg by mouth 2 (two) times daily as needed (pain).   Yes [provider]  Omega-3 Fatty Acids (FISH OIL) 1200 MG CAPS Take 1,200 mg by mouth daily.   Yes [provider]  Sod Picosulfate-Mag Ox-Cit Acd (CLENPIQ) 10-3.5-12 MG-GM -GM/160ML SOLN Take 1 kit by mouth as directed. 01/16/21  Yes Zehr, Laban Emperor, PA-C     Allergies:     Allergies  Allergen Reactions   Bactrim Rash    Skin sloughing    Ropinirole Other (See Comments)    Increased leg spasms/poor sleep.    Latex Rash     Physical Exam:   Vitals  Blood pressure 103/68, pulse 76, temperature (!) 97.4 F (36.3 C), resp. rate 19, SpO2 100 %.   1. General well developed male, lying in bed in NAD,    2. Normal affect and insight, Not Suicidal or Homicidal, Awake Alert, Oriented X 3.  3. No F.N deficits, ALL C.Nerves Intact, patient with paraplegia, upper extremity motor strength 5/5  4. Ears and Eyes appear Normal, Conjunctivae clear, PERRLA. Moist Oral Mucosa.  5. Supple Neck, No JVD, No cervical lymphadenopathy appriciated, No Carotid Bruits.  6.  Symmetrical Chest wall movement, Good air movement bilaterally, CTAB.  7. RRR, No Gallops, Rubs or Murmurs, No Parasternal Heave.  8. Positive Bowel Sounds, Abdomen Soft, No tenderness, No organomegaly appriciated,No rebound -guarding or rigidity.  9.  No Cyanosis, Normal Skin Turgor, No Skin Rash or Bruise.  Patient with mild perianal area covered with adhesives.  An area covered with adhesives  10. Good muscle tone, normal range of motion of all extremities  11. No Palpable Lymph Nodes in Neck or Axillae    Data Review:    CBC Recent Labs  Lab 04/05/21 1558  HGB 7.1*  HCT 21.0*   ------------------------------------------------------------------------------------------------------------------  Chemistries  Recent Labs  Lab 04/02/21 1558 04/04/21 1144 04/05/21 1246 04/05/21 1558  NA 134 132* 131* 132*  K 5.1 5.3* 4.9 4.7  CL 101 102 100 105  CO2 17* 21* 17*  --   GLUCOSE 89 137* 79 116*  BUN 43* 49* 54* 50*  CREATININE 2.62* 3.51* 4.30* 4.20*  CALCIUM 10.3* 9.7 9.8  --    ------------------------------------------------------------------------------------------------------------------ estimated creatinine clearance is 16.9 mL/min (A) (by C-G formula based on SCr of 4.2 mg/dL (H)). ------------------------------------------------------------------------------------------------------------------ No results for input(s): TSH, T4TOTAL, T3FREE, THYROIDAB in the last 72 hours.  Invalid input(s): FREET3  Coagulation profile Recent Labs  Lab 04/05/21 1614  INR 1.2   ------------------------------------------------------------------------------------------------------------------- No results for input(s): DDIMER in the last 72 hours. -------------------------------------------------------------------------------------------------------------------  Cardiac Enzymes No results for input(s): CKMB, TROPONINI, MYOGLOBIN in the last 168 hours.  Invalid input(s):  CK ------------------------------------------------------------------------------------------------------------------ No results found for: BNP   ---------------------------------------------------------------------------------------------------------------  Urinalysis    Component Value Date/Time   COLORURINE YELLOW 04/19/2020 2112   APPEARANCEUR CLOUDY (A) 04/19/2020 2112   LABSPEC 1.015 04/19/2020 2112   PHURINE 6.0 04/19/2020 2112   GLUCOSEU 100 (A) 04/19/2020 2112   HGBUR LARGE (A) 04/19/2020 2112   HGBUR large 06/19/2010 1630   BILIRUBINUR negative 01/08/2021 1117   BILIRUBINUR Negative 08/27/2018 0920   KETONESUR negative 01/08/2021 [place] 04/19/2020 2112   PROTEINUR >300 (A) 04/19/2020 2112   UROBILINOGEN 0.2 01/08/2021 1117   UROBILINOGEN 0.2 06/19/2010 1630   NITRITE Positive (A) 01/08/2021 1117   NITRITE POSITIVE (A) 04/19/2020 2112   LEUKOCYTESUR Large (3+) (A) 01/08/2021 1117   LEUKOCYTESUR MODERATE (A) 04/19/2020 2112    ----------------------------------------------------------------------------------------------------------------   Imaging Results:    No results found.  EKG pending at time of admission   Assessment & Plan:    Active Problems:   Paraplegia ([place])   History of decubitus ulcer   Urinary bladder neurogenic dysfunction   Alcohol use   Anxiety   Muscle spasticity   Obstructive uropathy   Obstructive uropathy/AKI/squamous metaplasia -Patient with worsening renal function, obstructive uropathy, creatinine up to 4.3 with evidence of bilateral hydronephrosis. -Cystoscopy by Dr. Alyson Ingles at [facility] today, with unsuccessful attempt for stent placement as unable to find ureteral openings and bladder given extensive squamous metaplasia, status post clot evacuation and transurethral resection of bladder tumor, large. -Dr. Alyson Ingles will inform [facility] to keep following at [facility], will he discussed with IR  regarding nephrostomy tube, I have discussed with Dr. Annamaria Boots, procedure will be done tomorrow, patient can eat now, and he can be n.p.o. after midnight. -Give 1 dose of vancomycin given history of VRE, but most recent urine culture 7/18 is negative, so we will hold on further antibiotics, but I will obtain urine analysis.  Acute blood loss anemia -Due to hematuria, with hemoglobin of 7.1, will obtain anemia panel, and transfuse 1 unit PRBC  Hyperlipidemia -Continue with Lipitor  Paraplegia -We will consult PT/OT -Patient with questionable presents of pressure ulcer, but upon my evaluation,  perianal area there is very strong adhesive, unable to remove in fear of causing skin laceration, so will  consult wound care.  Hyponatremia -Due to AKI, should correct after obstruction resolution.    Alcohol use disorder -Cussed with wife, he only drinks on the weekends, last drink was a week ago, low probability for withdrawals, but will monitor closely.     DVT Prophylaxis SCDs no chemical anticoagulation due to hematuria  AM Labs Ordered, also please review Full Orders  Family Communication: Admission, patients condition and plan of care including tests being ordered have been discussed with the patient and wife who indicate understanding and agree with the plan and Code Status.  Code Status Full  Likely DC to  Home  Condition GUARDED    Consults called: urology Dr Benita Stabile at [facility], and he will inform [facility] at [facility] to keep following, IR is aware as well.  Admission status: inpatient    Time spent in minutes : 65 minutes   Phillips Climes M.D on 04/05/2021 at [place] PM   [facility] - Office  [phone]

## 2021-04-05 NOTE — Progress Notes (Signed)
[facility] here to transport pt to [place], report given, pts s/o in attendance.

## 2021-04-05 NOTE — Progress Notes (Signed)
Telephone call  to Dr. Alyson Ingles , patient to remain NPO until patient has interventional radiology procedure.

## 2021-04-05 NOTE — Anesthesia Preprocedure Evaluation (Signed)
Anesthesia Evaluation  Patient identified by MRN, date of birth, ID band Patient awake    Reviewed: Allergy & Precautions, NPO status , Patient's Chart, lab work & pertinent test results  Airway Mallampati: II  TM Distance: >3 FB Neck ROM: Full    Dental  (+) Dental Advisory Given, Teeth Intact   Pulmonary neg pulmonary ROS,    Pulmonary exam normal breath sounds clear to auscultation       Cardiovascular Exercise Tolerance: Poor METS (paraplegic): Normal cardiovascular exam Rhythm:Regular Rate:Normal     Neuro/Psych Anxiety  Neuromuscular disease (paraplegia)    GI/Hepatic PUD, (+)     substance abuse  alcohol use,   Endo/Other  negative endocrine ROS  Renal/GU Renal Insufficiency and CRFRenal disease (left atrophic kidney, right hydronephrosis) Bladder dysfunction (neurogenic dysfunction)      Musculoskeletal Paraplegic  Muscle spasticity   Abdominal   Peds  Hematology negative hematology ROS (+)   Anesthesia Other Findings T8,T9 Fusion  Reproductive/Obstetrics negative OB ROS                             Anesthesia Physical Anesthesia Plan  ASA: 4  Anesthesia Plan: General   Post-op Pain Management:    Induction: Intravenous  PONV Risk Score and Plan: 4 or greater and Ondansetron and Dexamethasone  Airway Management Planned: Oral ETT and LMA  Additional Equipment:   Intra-op Plan:   Post-operative Plan: Extubation in OR  Informed Consent: I have reviewed the patients History and Physical, chart, labs and discussed the procedure including the risks, benefits and alternatives for the proposed anesthesia with the patient or authorized representative who has indicated his/her understanding and acceptance.       Plan Discussed with: CRNA and Surgeon  Anesthesia Plan Comments:         Anesthesia Quick Evaluation

## 2021-04-05 NOTE — Op Note (Signed)
.  Preoperative diagnosis: bilateral hydronephrosis, bladder lesions  Postoperative diagnosis: Same  Procedure: 1 cystoscopy 2.   Clot evacuation 3. Transurethral resection of bladder tumor, large  Attending: Rosie Fate  Anesthesia: General  Estimated blood loss: Minimal  Drains: 22 French foley  Specimens: bladder tumor  Antibiotics: ancef  Findings: multiple areas of irritation from foley catheter. 60cc clot in the bladder. Squamous metaplasia and sessile bladder tumor involving entire surface of the bladder, greater than 10cm. Unable to identify the ureteral orifices.  Indications: Patient is a 60 year old male with a history of solitary kidney with new hydronephrosis with renal failure. He was found to have bladder lesions on office cystoscopy. After discussing treatment options, they decided proceed with bilateral retrogrades, right ureteral stent placement and transurethral resection of a bladder lesions.  Procedure her in detail: The patient was brought to the operating room and a brief timeout was done to ensure correct patient, correct procedure, correct site.  General anesthesia was administered patient was placed in dorsal lithotomy position.  Their genitalia was then prepped and draped in usual sterile fashion.  A rigid 42 French cystoscope was passed in the urethra and the bladder.  Bladder was inspected and we noted a large amount of squamous metaplasia and sessile bladder tumor involving the entire surface of the bladder. We were unable to identify the ureteral orifices.  We then removed the cystoscope and placed a resectoscope into the bladder. We proceeded to remove the large squamous metaplasia and debris from the bladder. Once this was complete we turned our attention to the bladder tumor. Using the bipolar resectoscope we removed the multiple bladder tumors down to the base. Hemostasis was then obtained with electrocautery. We then removed the bladder tumor chips and  sent them for pathology. We then re-inspected the bladder and found no residula bleeding.  the bladder was then drained, a 22 French foley was placed and this concluded the procedure which was well tolerated by patient.  Complications: None  Condition: Stable, extubated, transferred to PACU  Plan: Patient is admitted overnight due to renal failure and hydronephrosis in a solitary kidney. He will need urgent right nephrostomy tube placement

## 2021-04-05 NOTE — Transfer of Care (Signed)
Immediate Anesthesia Transfer of Care Note  Patient: Thomas Schroeder  Procedure(s) Performed: CYSTOSCOPY (Bilateral) TRANSURETHRAL RESECTION OF BLADDER TUMOR (TURBT)  Patient Location: PACU  Anesthesia Type:General  Level of Consciousness: drowsy  Airway & Oxygen Therapy: Patient Spontanous Breathing and Patient connected to face mask oxygen  Post-op Assessment: Report given to RN and Post -op Vital signs reviewed and stable  Post vital signs: Reviewed and stable  Last Vitals:  Vitals Value Taken Time  BP    Temp    Pulse    Resp    SpO2      Last Pain:  Vitals:   04/05/21 1229  TempSrc: Oral  PainSc: 0-No pain         Complications: No notable events documented.

## 2021-04-05 NOTE — Anesthesia Procedure Notes (Signed)
Procedure Name: LMA Insertion Date/Time: 04/05/2021 1:46 PM Performed by: Orlie Dakin, CRNA Pre-anesthesia Checklist: Patient identified, Emergency Drugs available, Suction available and Patient being monitored Patient Re-evaluated:Patient Re-evaluated prior to induction Oxygen Delivery Method: Circle system utilized Preoxygenation: Pre-oxygenation with 100% oxygen Induction Type: IV induction LMA: LMA inserted LMA Size: 4.0 Tube type: Oral Number of attempts: 1 Placement Confirmation: positive ETCO2 Tube secured with: Tape Dental Injury: Teeth and Oropharynx as per pre-operative assessment

## 2021-04-05 NOTE — Progress Notes (Signed)
Pt has 2.5 cm x 2.0 cm superficial wound Stage 2, applied Aquacel and Mepilex drsg, several area of redness noted and Mepilex applied to all sites. Wound Ostomy consult, pt paraplegic and request low pressure air bed. SRP,RN

## 2021-04-05 NOTE — Progress Notes (Addendum)
Pt is being transferred to [place] via [facility]- report has been called to floor & [facility]. [facility] is on the way. H&H was reported to Dr Denny Levy. Let Dr Donn Pierini know [facility] was on the way to get pt. Glasses have been returned to pt and wife will take his belongings and wheelchair. PT/INR has been drawn by lab and we are awaiting [facility] arriva   1715 notified Dr Alyson Ingles of rmm assignment at [facility].l.

## 2021-04-06 ENCOUNTER — Inpatient Hospital Stay (HOSPITAL_COMMUNITY): Payer: BC Managed Care – PPO

## 2021-04-06 ENCOUNTER — Encounter (HOSPITAL_COMMUNITY): Payer: Self-pay | Admitting: Urology

## 2021-04-06 DIAGNOSIS — L89152 Pressure ulcer of sacral region, stage 2: Secondary | ICD-10-CM

## 2021-04-06 DIAGNOSIS — N139 Obstructive and reflux uropathy, unspecified: Secondary | ICD-10-CM

## 2021-04-06 DIAGNOSIS — L8990 Pressure ulcer of unspecified site, unspecified stage: Secondary | ICD-10-CM | POA: Insufficient documentation

## 2021-04-06 DIAGNOSIS — N319 Neuromuscular dysfunction of bladder, unspecified: Secondary | ICD-10-CM

## 2021-04-06 HISTORY — PX: IR US GUIDE VASC ACCESS LEFT: IMG2389

## 2021-04-06 HISTORY — PX: IR NEPHROSTOMY PLACEMENT RIGHT: IMG6064

## 2021-04-06 LAB — CBC
HCT: 26.1 % — ABNORMAL LOW (ref 39.0–52.0)
Hemoglobin: 7.9 g/dL — ABNORMAL LOW (ref 13.0–17.0)
MCH: 24.7 pg — ABNORMAL LOW (ref 26.0–34.0)
MCHC: 30.3 g/dL (ref 30.0–36.0)
MCV: 81.6 fL (ref 80.0–100.0)
Platelets: 371 10*3/uL (ref 150–400)
RBC: 3.2 MIL/uL — ABNORMAL LOW (ref 4.22–5.81)
RDW: 17.3 % — ABNORMAL HIGH (ref 11.5–15.5)
WBC: 12.6 10*3/uL — ABNORMAL HIGH (ref 4.0–10.5)
nRBC: 0 % (ref 0.0–0.2)

## 2021-04-06 LAB — COMPREHENSIVE METABOLIC PANEL
ALT: 25 U/L (ref 0–44)
AST: 13 U/L — ABNORMAL LOW (ref 15–41)
Albumin: 2.2 g/dL — ABNORMAL LOW (ref 3.5–5.0)
Alkaline Phosphatase: 227 U/L — ABNORMAL HIGH (ref 38–126)
Anion gap: 8 (ref 5–15)
BUN: 56 mg/dL — ABNORMAL HIGH (ref 6–20)
CO2: 19 mmol/L — ABNORMAL LOW (ref 22–32)
Calcium: 9.2 mg/dL (ref 8.9–10.3)
Chloride: 106 mmol/L (ref 98–111)
Creatinine, Ser: 3.96 mg/dL — ABNORMAL HIGH (ref 0.61–1.24)
GFR, Estimated: 17 mL/min — ABNORMAL LOW (ref >60)
Glucose, Bld: 167 mg/dL — ABNORMAL HIGH (ref 70–99)
Potassium: 6.4 mmol/L (ref 3.5–5.1)
Sodium: 133 mmol/L — ABNORMAL LOW (ref 135–145)
Total Bilirubin: 1.2 mg/dL (ref 0.3–1.2)
Total Protein: 6.1 g/dL — ABNORMAL LOW (ref 6.5–8.1)

## 2021-04-06 LAB — BPAM RBC
Blood Product Expiration Date: 202208112359
ISSUE DATE / TIME: 202207212255
Unit Type and Rh: 600

## 2021-04-06 LAB — TYPE AND SCREEN
ABO/RH(D): A NEG
Antibody Screen: NEGATIVE
Unit division: 0

## 2021-04-06 LAB — POTASSIUM: Potassium: 4.7 mmol/L (ref 3.5–5.1)

## 2021-04-06 MED ORDER — NEPRO/CARBSTEADY PO LIQD
237.0000 mL | ORAL | Status: DC
  Filled 2021-04-06: qty 237

## 2021-04-06 MED ORDER — BOOST / RESOURCE BREEZE PO LIQD CUSTOM
1.0000 | ORAL | Status: DC
  Administered 2021-04-07: 1 via ORAL

## 2021-04-06 MED ORDER — MIDAZOLAM HCL 2 MG/2ML IJ SOLN
INTRAMUSCULAR | Status: AC
  Filled 2021-04-06: qty 4

## 2021-04-06 MED ORDER — FENTANYL CITRATE (PF) 100 MCG/2ML IJ SOLN
INTRAMUSCULAR | Status: AC | PRN
  Administered 2021-04-06 (×2): 50 ug via INTRAVENOUS

## 2021-04-06 MED ORDER — PROSOURCE PLUS PO LIQD
30.0000 mL | Freq: Every day | ORAL | Status: DC
  Administered 2021-04-07: 30 mL via ORAL
  Filled 2021-04-06: qty 30

## 2021-04-06 MED ORDER — IOHEXOL 300 MG/ML  SOLN
15.0000 mL | Freq: Once | INTRAMUSCULAR | Status: AC | PRN
  Administered 2021-04-06: 15 mL

## 2021-04-06 MED ORDER — SODIUM BICARBONATE 8.4 % IV SOLN
INTRAVENOUS | Status: DC
  Filled 2021-04-06: qty 150
  Filled 2021-04-06: qty 1000
  Filled 2021-04-06 (×2): qty 150
  Filled 2021-04-06: qty 1000
  Filled 2021-04-06: qty 150

## 2021-04-06 MED ORDER — SODIUM CHLORIDE 0.9 % IV SOLN
INTRAVENOUS | Status: AC
  Administered 2021-04-06: 2 g
  Filled 2021-04-06: qty 20

## 2021-04-06 MED ORDER — CHLORHEXIDINE GLUCONATE CLOTH 2 % EX PADS
6.0000 | MEDICATED_PAD | Freq: Every day | CUTANEOUS | Status: DC
  Administered 2021-04-06 – 2021-04-08 (×3): 6 via TOPICAL

## 2021-04-06 MED ORDER — LIDOCAINE HCL 1 % IJ SOLN
INTRAMUSCULAR | Status: AC
  Filled 2021-04-06: qty 20

## 2021-04-06 MED ORDER — SODIUM ZIRCONIUM CYCLOSILICATE 10 G PO PACK
10.0000 g | PACK | Freq: Once | ORAL | Status: AC
  Administered 2021-04-06: 10 g via ORAL
  Filled 2021-04-06: qty 1

## 2021-04-06 MED ORDER — LIDOCAINE-EPINEPHRINE 1 %-1:100000 IJ SOLN
INTRAMUSCULAR | Status: AC | PRN
  Administered 2021-04-06: 10 mL

## 2021-04-06 MED ORDER — FENTANYL CITRATE (PF) 100 MCG/2ML IJ SOLN
INTRAMUSCULAR | Status: AC
  Filled 2021-04-06: qty 2

## 2021-04-06 MED ORDER — MIDAZOLAM HCL 2 MG/2ML IJ SOLN
INTRAMUSCULAR | Status: AC | PRN
  Administered 2021-04-06 (×3): 1 mg via INTRAVENOUS

## 2021-04-06 NOTE — Progress Notes (Signed)
Pt in good spirits. No complaints.   Rt Neph tube in place, foley in place - urine light red   A/P -   1) hydronephrosis - AKI - from bladder obstruction of solitary right kidney s/p right Neph tube (his left kidney is atrophic from accident). Consider stent internalization (pt asked about it).   2) S/p TURBT with Dr. Alyson Ingles 04/05/2021 - path pending.   3) NGB bladder - continue foley   Please page GU with any questions, concerns or changes in pt status.

## 2021-04-06 NOTE — Progress Notes (Signed)
Initial Nutrition Assessment  DOCUMENTATION CODES:   Not applicable  INTERVENTION:  - will order Boost Breeze BID, each supplement provides 250 kcal and 9 grams of protein. - will order 30 ml Prosource Plus BID, each supplement provides 100 kcal and 15 grams protein.  - diet liberalized from Renal with 1.2 L restriction to Regular without fluid restriction after talking with MD via secure chat.    NUTRITION DIAGNOSIS:   Increased nutrient needs related to acute illness, chronic illness as evidenced by estimated needs.  GOAL:   Patient will meet greater than or equal to 90% of their needs  MONITOR:   PO intake, Supplement acceptance, Labs, Weight trends  REASON FOR ASSESSMENT:   Malnutrition Screening Tool  ASSESSMENT:   60 year old male with medical history of traumatic paraplegia, neurogenic bladder with chronic indwelling Foley catheter, stage 2 pressure injury to sacrum, HLD, and anxiety disorder. He presented to [facility] from Urologist's office for evaluation of non-functioning Foley. Cystoscopy showed diffuse squamous metaplasia and sessile bladder tumor. He was noted to have worsening renal function and hydronephrosis so was transferred to [facility] for IR placement of nephrostomy.  Patient laying in bed with no family or visitors present. He was asleep when RD entered the room but easily awoke to name call x1.   Patient is able to outline changes to appetite, intakes, and medical needs over the past 5-6 months. Patient is paraplegic and uses a power wheelchair to get around. His wife is a PT.   He is usually a carnivore, ut reports that around February he began to be repulsed by many meats and other once-favorite foods. He states that this change occurred over a few weeks and since has remained fairly constant. He did eat a hamburger for lunch today and reported that this did not bother him and that now it is more so the seasoning on meat that bothers him.  He has no chewing  or swallowing difficulties. No discomfort with eating.   He reports that he typically eats 7 oz of yogurt and 2 cups of coffee for breakfast, eats lunch which consists of grazing from 10 AM-2 PM, and then eats a typical dinner meal. Despite this, he reports 30 lb weight loss in the past 5-6 months.   Weight yesterday was 165 lb, weight on 04/04/21 was 159 lb, and weight on 12/05/20 was 169 lb.   Late this AM he underwent placement of perc nephrostomy.   Per notes: - acute urinary obstruction 2/2 bladder tumor and hydronephrosis (R>L) - atrophic L kidney - new dx of bladder cancer - ARF with hyperkalemia with worsening renal function 2/2 bladder obstruction - from home with plan to return home at time of d/c   Labs reviewed; Na: 133 mmol/l, K: 6.4 mmol/l (before lokelma given), BUN: 56 mg/dl, creatinine: 3.96 mg/dl, Alk Phos elevated, GFR: 17 ml/min.  Medications reviewed; 1 lovaza/day, 10 g lokelma at 0515. IVF; D5-150 mEq IV sodium bicarb @ 100 ml/hr (408 kcal/24 hrs).     NUTRITION - FOCUSED PHYSICAL EXAM:  Completed to upper body; no muscle or fat depletions.   Diet Order:   Diet Order             Diet regular Room service appropriate? Yes; Fluid consistency: Thin  Diet effective now                   EDUCATION NEEDS:   No education needs have been identified at this time  Skin:  Skin Assessment: Skin Integrity Issues: Skin Integrity Issues:: Stage II Stage II: L IT  Last BM:  PTA/unknown  Height:   Ht Readings from Last 1 Encounters:  04/05/21 '5\' 6"'  (1.676 m)    Weight:   Wt Readings from Last 1 Encounters:  04/05/21 75 kg      Estimated Nutritional Needs:  Kcal:  2000-2200 kcal Protein:  100-115 grams Fluid:  >/= 2.2 L/day       Jarome Matin, MS, RD, LDN, CNSC Inpatient Clinical Dietitian RD pager # available in [facility]  After hours/weekend pager # available in [facility]

## 2021-04-06 NOTE — Procedures (Signed)
Interventional Radiology Procedure Note  Procedure: Right 62F percutaneous nephrostomy placed.   Complications: None  Estimated Blood Loss: None  Recommendations: - Urine sent for culture - Tube to bag, no need to flush - Consider conversion to JJ stent in 1-2 weeks   Signed,  Criselda Peaches, MD

## 2021-04-06 NOTE — Progress Notes (Signed)
PROGRESS NOTE    Thomas Schroeder  [id number] DOB: [birth year] DOA: 04/05/2021 PCP: Hali Marry, MD    Brief Narrative:  60 year old gentleman with history of traumatic paraplegia, neurogenic bladder with chronic indwelling Foley catheter, pressure ulcer stage II sacrum, hyperlipidemia, anxiety disorder.  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  He was found with worsening renal functions, hydronephrosis so transferred to [facility] to involve IR for nephrostomy placement.   Assessment & Plan:   Active Problems:   Paraplegia ([place])   History of decubitus ulcer   Urinary bladder neurogenic dysfunction   Alcohol use   Anxiety   Muscle spasticity   Obstructive uropathy   Pressure injury of skin  Acute urinary obstruction secondary to bladder tumor, hydronephrosis right more than left.  Atrophic left kidney, neurogenic bladder and chronic Foley catheter: Plan for right-sided percutaneous nephrostomy today by IR. Continue intake and output monitoring. Continue Foley catheter. Rest of the management as per urology. Apparently has new diagnosis of bladder cancer.  Acute renal failure with hyperkalemia: Recently known normal kidney functions.  Worsening kidney function secondary to bladder obstruction. Will start patient on dextrose bicarbonate drip when he is NPO. Hopefully percutaneous nephrostomy will improve the renal functions. Potassium more than 6, given 1 dose of Lokelma, will repeat today. Daily renal functions monitoring.  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  Lives at home with wife.  Acute blood loss anemia: Secondary to hematuria.  Hemoglobin 7.1.  Transfuse 1 unit overnight with appropriate response.  Will monitor.  Sacral decubitus ulcer  stage II present on admission: Seen by wound care.  Recommended local wound care and barrier dressing.  DVT prophylaxis: SCDs Start: 04/05/21 1856   Code Status: Full code Family Communication: None today Disposition Plan: Status is: Inpatient  Remains inpatient appropriate because:Inpatient level of care appropriate due to severity of illness  Dispo: The patient is from: Home              Anticipated d/c is to: Home              Patient currently is not medically stable to d/c.   Difficult to place patient No         Consultants:  Urology Interventional radiology  Procedures:  Right percutaneous nephrostomy tube placement planned for today  Antimicrobials:  Gentamicin perioperative given 7/21   Subjective: Patient seen and examined in the morning rounds.  No overnight events.  Looking forward to go for surgery.  Objective: Vitals:   04/05/21 2346 04/06/21 0218 04/06/21 0558 04/06/21 0950  BP: (!) 96/50 106/62 121/80 109/71  Pulse: 71 81 76 85  Resp: '18 19 18 18  '$ Temp: 97.6 F (36.4 C) 97.6 F (36.4 C) 97.6 F (36.4 C) 97.8 F (36.6 C)  TempSrc: Oral Oral  Oral  SpO2: 100% 100% 99% 100%  Weight:      Height:        Intake/Output Summary (Last 24 hours) at 04/06/2021 1140 Last data filed at 04/06/2021 0603 Gross per 24 hour  Intake 1572.5 ml  Output 1400 ml  Net 172.5 ml   Filed Weights   04/05/21 1838  Weight: 75 kg    Examination:  General exam: Appears calm and comfortable  On room air.  Comfortable at rest. Respiratory system: Clear  to auscultation. Respiratory effort normal.  No added sounds. Cardiovascular system: S1 & S2 heard, RRR.  Gastrointestinal system: Soft and nontender.  Bowel sounds present. Central nervous system: Alert and oriented.  Patient has atrophic both legs with no sensations. Foley catheter with minimal clear urine. Sacrum: Stage II decubitus ulcer.  On barrier dressing.    Data Reviewed: I have personally reviewed  following labs and imaging studies  CBC: Recent Labs  Lab 04/05/21 1558 04/05/21 1909 04/06/21 0342  WBC  --  11.8* 12.6*  HGB 7.1* 7.2* 7.9*  HCT 21.0* 25.5* 26.1*  MCV  --  81.7 81.6  PLT  --  [id number]   Basic Metabolic Panel: Recent Labs  Lab 04/02/21 1558 04/04/21 1144 04/05/21 1246 04/05/21 1558 04/06/21 0342  NA 134 132* 131* 132* 133*  K 5.1 5.3* 4.9 4.7 6.4*  CL 101 102 100 105 106  CO2 17* 21* 17*  --  19*  GLUCOSE 89 137* 79 116* 167*  BUN 43* 49* 54* 50* 56*  CREATININE 2.62* 3.51* 4.30* 4.20* 3.96*  CALCIUM 10.3* 9.7 9.8  --  9.2   GFR: Estimated Creatinine Clearance: 17.9 mL/min (A) (by C-G formula based on SCr of 3.96 mg/dL (H)). Liver Function Tests: Recent Labs  Lab 04/06/21 0342  AST 13*  ALT 25  ALKPHOS 227*  BILITOT 1.2  PROT 6.1*  ALBUMIN 2.2*   No results for input(s): LIPASE, AMYLASE in the last 168 hours. No results for input(s): AMMONIA in the last 168 hours. Coagulation Profile: Recent Labs  Lab 04/05/21 1614  INR 1.2   Cardiac Enzymes: No results for input(s): CKTOTAL, CKMB, CKMBINDEX, TROPONINI in the last 168 hours. BNP (last 3 results) No results for input(s): PROBNP in the last 8760 hours. HbA1C: No results for input(s): HGBA1C in the last 72 hours. CBG: No results for input(s): GLUCAP in the last 168 hours. Lipid Profile: No results for input(s): CHOL, HDL, LDLCALC, TRIG, CHOLHDL, LDLDIRECT in the last 72 hours. Thyroid Function Tests: No results for input(s): TSH, T4TOTAL, FREET4, T3FREE, THYROIDAB in the last 72 hours. Anemia Panel: Recent Labs    04/05/21 2036  VITAMINB12 486  FOLATE 18.8  FERRITIN 579*  TIBC 209*  IRON 15*  RETICCTPCT 1.7   Sepsis Labs: No results for input(s): PROCALCITON, LATICACIDVEN in the last 168 hours.  Recent Results (from the past 240 hour(s))  Urine Culture     Status: None   Collection Time: 04/02/21  3:59 PM   Specimen: Urine   UR  Result Value Ref Range Status   Urine  Culture, Routine Final report  Final   Organism ID, Bacteria Comment  Final    Comment: Greater than 2 organisms recovered, none predominant. Please submit another sample if clinically indicated. Greater than 100,000 colony forming units per mL          Radiology Studies: No results found.      Scheduled Meds:  atorvastatin  20 mg Oral Daily   Chlorhexidine Gluconate Cloth  6 each Topical Daily   feeding supplement (NEPRO CARB STEADY)  237 mL Oral BID BM   fentaNYL       lidocaine       midazolam       omega-3 acid ethyl esters  1 g Oral Daily   Continuous Infusions:  sodium bicarbonate 150 mEq in D5W infusion     cefTRIAXone (ROCEPHIN) IVPB 2 gram/100 mL NS (Mini-Bag Plus)       LOS: 1  day    Time spent: 30 minutes    Barb Merino, MD [facility] Pager [phone]

## 2021-04-06 NOTE — Consult Note (Signed)
Chief Complaint: Patient was seen in consultation today for obstructive uropathy, squamous metaplasia  Referring Physician(s): Dr. Alyson Ingles  Supervising Physician: Jacqulynn Cadet  Patient Status: Thomas Schroeder - In-pt  History of Present Illness: Thomas Schroeder is a 60 y.o. male with past medical history of paraplegia, muscle spasticity, indwelling Foley catheter, acute-on-chronic kidney disease who presented to [facility] for cystoscopy by Dr. Alyson Ingles for stent placement.  The ureteral openings could not be identified due to extensive squamous metaplasia and bladder tumor.  He was transferred to [facility] overnight for percutaneous nephrostomy tube placement.  Case reviewed and approved by Dr. Pascal Lux.  Patient has been NPO.   Patient assessed at bedside.  He is aware of the procedure plan today and is agreeable to proceed.   Past Medical History:  Diagnosis Date   Allergy    Chronic indwelling Foley catheter    Chronic kidney disease    ED (erectile dysfunction)    History of recurrent UTIs    Night muscle spasms    Paraplegia [facility])     Past Surgical History:  Procedure Laterality Date   APPENDECTOMY     BIOPSY  03/22/2021   Procedure: BIOPSY;  Surgeon: Lavena Bullion, DO;  Location: [facility];  Service: Gastroenterology;;   COLONOSCOPY N/A 03/22/2021   Procedure: COLONOSCOPY;  Surgeon: Lavena Bullion, DO;  Location: [facility];  Service: Gastroenterology;  Laterality: N/A;   CYSTOSCOPY W/ RETROGRADES Bilateral 04/05/2021   Procedure: CYSTOSCOPY;  Surgeon: Cleon Gustin, MD;  Location: [facility];  Service: Urology;  Laterality: Bilateral;   FEMUR FRACTURE SURGERY     INTRAMEDULLARY (IM) NAIL INTERTROCHANTERIC Left 06/24/2020   Procedure: INTRAMEDULLARY (IM) NAIL FEMORAL;  Surgeon: Mcarthur Rossetti, MD;  Location: [facility];  Service: Orthopedics;  Laterality: Left;   kidney stone removal     POLYPECTOMY  03/22/2021   Procedure: POLYPECTOMY;  Surgeon: Lavena Bullion, DO;   Location: [facility];  Service: Gastroenterology;;   RT tibia fracture     T8 T9 fusion      TONSILLECTOMY     TRANSURETHRAL RESECTION OF BLADDER TUMOR N/A 04/05/2021   Procedure: TRANSURETHRAL RESECTION OF BLADDER TUMOR (TURBT);  Surgeon: Cleon Gustin, MD;  Location: [facility];  Service: Urology;  Laterality: N/A;    Allergies: Bactrim, Ropinirole, and Latex  Medications: Prior to Admission medications   Medication Sig Start Date End Date Taking? Authorizing Provider  acetaminophen (TYLENOL) 500 MG tablet Take 1,000 mg by mouth every 6 (six) hours as needed for moderate pain or headache.   Yes [provider]  aspirin 325 MG tablet Take 325 mg by mouth daily.   Yes [provider]  atorvastatin (LIPITOR) 20 MG tablet TAKE 1 TABLET AT BEDTIME Patient taking differently: Take 20 mg by mouth daily. 11/21/20  Yes Hali Marry, MD  CRANBERRY EXTRACT PO Take 4,200 mg by mouth daily.   Yes [provider]  Multiple Vitamin (MULTI VITAMIN MENS PO) Take 1 tablet by mouth daily.   Yes [provider]  naproxen sodium (ALEVE) 220 MG tablet Take 440 mg by mouth 2 (two) times daily as needed (pain).   Yes [provider]  Omega-3 Fatty Acids (FISH OIL) 1200 MG CAPS Take 1,200 mg by mouth daily.   Yes [provider]  Sod Picosulfate-Mag Ox-Cit Acd (CLENPIQ) 10-3.5-12 MG-GM -GM/160ML SOLN Take 1 kit by mouth as directed. 01/16/21  Yes Zehr, Laban Emperor, PA-C     Family History  Problem  Relation Age of Onset   Hyperlipidemia Mother    Diabetes Mother    Rosacea Mother    Lung cancer Mother        smoker   Hypertension Neg Hx     Social History   Socioeconomic History   Marital status: Soil scientist    Spouse name: Not on file   Number of children: 2   Years of education: Not on file   Highest education level: Not on file  Occupational History    Employer: [employer]  Tobacco Use   Smoking status: Never   Smokeless  tobacco: Never  Vaping Use   Vaping Use: Never used  Substance and Sexual Activity   Alcohol use: Yes    Alcohol/week: 10.0 standard drinks    Types: 10 Standard drinks or equivalent per week    Comment: per week   Drug use: Not Currently   Sexual activity: Yes    Partners: Female  Other Topics Concern   Not on file  Social History Narrative   1 caffeine drinks per day.  Some exercise.    Social Determinants of Health   Financial Resource Strain: Not on file  Food Insecurity: Not on file  Transportation Needs: Not on file  Physical Activity: Not on file  Stress: Not on file  Social Connections: Not on file     Review of Systems: A 12 point ROS discussed and pertinent positives are indicated in the HPI above.  All other systems are negative.  Review of Systems  Constitutional:  Positive for fatigue. Negative for fever.  Respiratory:  Negative for cough and shortness of breath.   Cardiovascular:  Negative for chest pain.  Gastrointestinal:  Negative for abdominal pain and nausea.  Genitourinary:  Positive for hematuria.  Musculoskeletal:  Negative for back pain.  Psychiatric/Behavioral:  Negative for behavioral problems and confusion.    Vital Signs: BP 109/71 (BP Location: Left Arm)   Pulse 85   Temp 97.6 F (36.4 C)   Resp 18   Ht _0  (1.676 m)   Wt 165 lb 6.4 oz (75 kg)   SpO2 100%   BMI 26.70 kg/m   Physical Exam Vitals and nursing note reviewed.  Constitutional:      General: He is not in acute distress.    Appearance: Normal appearance. He is not ill-appearing.  HENT:     Mouth/Throat:     Mouth: Mucous membranes are moist.     Pharynx: Oropharynx is clear.  Cardiovascular:     Rate and Rhythm: Normal rate and regular rhythm.  Pulmonary:     Effort: Pulmonary effort is normal.     Breath sounds: Normal breath sounds.  Abdominal:     General: Abdomen is flat.     Palpations: Abdomen is soft.  Musculoskeletal:     Comments: paraplegia  Skin:     General: Skin is warm and dry.  Neurological:     General: No focal deficit present.     Mental Status: He is alert and oriented to person, place, and time. Mental status is at baseline.  Psychiatric:        Mood and Affect: Mood normal.        Behavior: Behavior normal.        Thought Content: Thought content normal.        Judgment: Judgment normal.     MD Evaluation Airway: WNL Heart: WNL Abdomen: WNL Chest/ Lungs: WNL ASA  Classification: 3 Mallampati/Airway Score:  Two   Imaging: CT ABDOMEN PELVIS WO CONTRAST  Result Date: 03/18/2021 CLINICAL DATA:  Hematuria EXAM: CT ABDOMEN AND PELVIS WITHOUT CONTRAST TECHNIQUE: Multidetector CT imaging of the abdomen and pelvis was performed following the standard protocol without IV contrast. COMPARISON:  None. FINDINGS: Lower chest: Lung bases are clear. Hepatobiliary: No focal hepatic lesion. No biliary duct dilatation. Common bile duct is normal. Pancreas: Pancreas is normal. No ductal dilatation. No pancreatic inflammation. Spleen: Normal spleen Adrenals/urinary tract: Adrenal glands normal. LEFT kidney is atrophic. This compensatory hypertrophy of the RIGHT kidney. There is bilateral hydroureter extending to level of the vesicoureteral junctions. No obstructing lesion identified. 3 small nonobstructing 1 mm calculi in lower pole of the RIGHT kidney. There is Foley catheter in the bladder. There is dense echogenic material within the lumen the bladder with scattered gas within the lumen. Bladder wall appears thickened. Stomach/Bowel: Stomach, small bowel cecum normal. Colon rectosigmoid colon normal. Vascular/Lymphatic:  calcification of the abdominal aorta. There is a ovoid lesion in the LEFT pelvis adjacent to the operator space measuring 3.4 by 3.0 cm. Lesion measures 6.2 cm in craniocaudad dimension (sagittal image 85/series 6). Reproductive: Prostate unremarkable. Bilateral hip prosthetics limited evaluation of the pelvis. Other: No free  fluid. Musculoskeletal: No aggressive osseous lesion. IMPRESSION: 1. Bilateral hydroureter without obstructing lesion identified. Hydroureter extends to the level of the vascular junctions. There is echogenic material within the lumen of the bladder with scattered pockets of gas. Presumably this echogenic material obstructs the ureters. Chronic Foley catheter in bladder 2. Atrophic LEFT kidney and hypertrophied RIGHT kidney. 3. Several small nonobstructing calculi present lower pole the RIGHT kidney. 4. Ovoid lesion in the LEFT operator space. Differential includes enlarged necrotic lymph node versus vascular structure such as a venous varix. Consider contrast enhanced imaging for further evaluation. Electronically Signed   By: Suzy Bouchard M.D.   On: 03/18/2021 18:10    Labs:  CBC: Recent Labs    06/25/20 0747 10/04/20 1314 04/05/21 1558 04/05/21 1909 04/06/21 0342  WBC 9.2 11.1*  --  11.8* 12.6*  HGB 10.1* 11.3* 7.1* 7.2* 7.9*  HCT 30.5* 34.9* 21.0* 25.5* 26.1*  PLT 181 448*  --  387 371    COAGS: Recent Labs    04/19/20 1753 04/05/21 1614  INR 1.1 1.2    BMP: Recent Labs    04/21/20 0914 06/23/20 1430 10/04/20 1314 10/16/20 0000 12/05/20 0000 04/02/21 1558 04/04/21 1144 04/05/21 1246 04/05/21 1558 04/06/21 0342  NA 137   < > 133* 137 138 134 132* 131* 132* 133*  K 3.9   < > 4.4 4.6 4.6 5.1 5.3* 4.9 4.7 6.4*  CL 104   < > 98 99 100 101 102 100 105 106  CO2 23   < > _0 17* 21* 17*  --  19*  GLUCOSE 161*   < > 100 117* 118* 89 137* 79 116* 167*  BUN 9   < > _1 43* 49* 54* 50* 56*  CALCIUM 8.5*   < > 8.9 9.7 10.5* 10.3* 9.7 9.8  --  9.2  CREATININE 0.92   < > 0.77 0.73 0.74 2.62* 3.51* 4.30* 4.20* 3.96*  GFRNONAA >60   < > 99 101 100  --  19* 15*  --  17*  GFRAA >60  --  114 117 116  --   --   --   --   --    < > = values in this interval not  displayed.    LIVER FUNCTION TESTS: Recent Labs    04/19/20 1753 06/07/20 0931 10/04/20 1314  10/16/20 0000 12/05/20 0000 04/06/21 0342  BILITOT 1.1   < > 0.4 0.4 0.5 1.2  AST 32   < > 32 26 17 13*  ALT 51*   < > 47* 34 19 25  ALKPHOS 86  --   --   --   --  227*  PROT 6.8   < > 6.4 6.8 7.3 6.1*  ALBUMIN 3.4*  --   --   --   --  2.2*   < > = values in this interval not displayed.    TUMOR MARKERS: No results for input(s): AFPTM, CEA, CA199, CHROMGRNA in the last 8760 hours.  Assessment and Plan: Obstructive uropathy Patient underwent cystoscopy yesterday with attempted ureteral stent placement, however unsuccessful due to squamous metaplasia.  Transferred to [facility] for ongoing management.  IR consulted for percutaneous nephrostomy tube placement in solitary right kidney.  Case reviewed and approved by Dr. Pascal Lux.  Mr. Franze has been NPO.  He is aware of hte goals of the procedure today and is agreeable to proceed.   Risks and benefits of right PCN placement was discussed with the patient including, but not limited to, infection, bleeding, significant bleeding causing loss or decrease in renal function or damage to adjacent structures.   All of the patient's questions were answered, patient is agreeable to proceed.  Consent signed and in chart.  Thank you for this interesting consult.  I greatly enjoyed meeting DEMARCUS THIELKE and look forward to participating in their care.  A copy of this report was sent to the requesting provider on this date.  Electronically Signed: Docia Barrier, PA 04/06/2021, 10:27 AM   I spent a total of 40 Minutes    in face to face in clinical consultation, greater than 50% of which was counseling/coordinating care for right hydronephrosis, obstructive uropathy.

## 2021-04-07 LAB — CBC WITH DIFFERENTIAL/PLATELET
Abs Immature Granulocytes: 0.13 10*3/uL — ABNORMAL HIGH (ref 0.00–0.07)
Basophils Absolute: 0 10*3/uL (ref 0.0–0.1)
Basophils Relative: 0 %
Eosinophils Absolute: 0.1 10*3/uL (ref 0.0–0.5)
Eosinophils Relative: 1 %
HCT: 26.3 % — ABNORMAL LOW (ref 39.0–52.0)
Hemoglobin: 7.9 g/dL — ABNORMAL LOW (ref 13.0–17.0)
Immature Granulocytes: 1 %
Lymphocytes Relative: 9 %
Lymphs Abs: 0.9 10*3/uL (ref 0.7–4.0)
MCH: 24.4 pg — ABNORMAL LOW (ref 26.0–34.0)
MCHC: 30 g/dL (ref 30.0–36.0)
MCV: 81.2 fL (ref 80.0–100.0)
Monocytes Absolute: 0.7 10*3/uL (ref 0.1–1.0)
Monocytes Relative: 7 %
Neutro Abs: 7.9 10*3/uL — ABNORMAL HIGH (ref 1.7–7.7)
Neutrophils Relative %: 82 %
Platelets: 400 10*3/uL (ref 150–400)
RBC: 3.24 MIL/uL — ABNORMAL LOW (ref 4.22–5.81)
RDW: 18.4 % — ABNORMAL HIGH (ref 11.5–15.5)
WBC: 9.8 10*3/uL (ref 4.0–10.5)
nRBC: 0 % (ref 0.0–0.2)

## 2021-04-07 LAB — BASIC METABOLIC PANEL
Anion gap: 10 (ref 5–15)
BUN: 52 mg/dL — ABNORMAL HIGH (ref 6–20)
CO2: 22 mmol/L (ref 22–32)
Calcium: 9.5 mg/dL (ref 8.9–10.3)
Chloride: 107 mmol/L (ref 98–111)
Creatinine, Ser: 3.43 mg/dL — ABNORMAL HIGH (ref 0.61–1.24)
GFR, Estimated: 20 mL/min — ABNORMAL LOW (ref >60)
Glucose, Bld: 114 mg/dL — ABNORMAL HIGH (ref 70–99)
Potassium: 4.7 mmol/L (ref 3.5–5.1)
Sodium: 139 mmol/L (ref 135–145)

## 2021-04-07 LAB — MAGNESIUM: Magnesium: 1.9 mg/dL (ref 1.7–2.4)

## 2021-04-07 LAB — PHOSPHORUS: Phosphorus: 4.5 mg/dL (ref 2.5–4.6)

## 2021-04-07 NOTE — Progress Notes (Signed)
Pt nephrostomy draining pink urine with small blood clots. Foley catheter urine is also pink.   Pt refusing vital signs overnight until 0600 tomorrow morning.

## 2021-04-07 NOTE — Progress Notes (Signed)
Pt requesting nutritional supplements be discontinued as he on a regular diet and is eating majority of meals.  MD notified.  New orders received.

## 2021-04-07 NOTE — Progress Notes (Signed)
PROGRESS NOTE    Thomas Schroeder  [id number] DOB: 1960/11/07 DOA: 04/05/2021 PCP: Hali Marry, MD    Brief Narrative:  60 year old gentleman with history of traumatic paraplegia, neurogenic bladder with chronic indwelling Foley catheter, pressure ulcer stage II sacrum, hyperlipidemia, anxiety disorder.  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  He was found with worsening renal functions, hydronephrosis so transferred to [facility] to involve IR for nephrostomy placement.   Assessment & Plan:   Active Problems:   Paraplegia ([place])   History of decubitus ulcer   Urinary bladder neurogenic dysfunction   Alcohol use   Anxiety   Muscle spasticity   Obstructive uropathy   Pressure injury of skin  Acute urinary obstruction secondary to bladder tumor, hydronephrosis right more than left.  Atrophic left kidney, neurogenic bladder and chronic Foley catheter: Right-sided PCN by IR 7/22 with good response.  Left kidney is atrophic. Continue intake and output monitoring. Continue Foley catheter. Rest of the management as per urology. Apparently has new diagnosis of bladder cancer.  Acute renal failure with hyperkalemia: Recently known normal kidney functions.  Worsening kidney function secondary to bladder obstruction. Continue maintenance IV fluids today.  Renal functions already improving.  Potassium has normalized.  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  Lives at home with wife.  Acute blood loss anemia: Secondary to hematuria.  Hemoglobin 7.1.  Transfuse 1 unit overnight with appropriate response.  Will monitor.  Sacral decubitus ulcer stage II present on admission: Seen by wound care.  Recommended local wound care and barrier dressing.  Reported alcohol use:  Patient denies everyday use of alcohol.  So far no evidence of alcohol withdrawal.  Will monitor closely.  DVT prophylaxis: SCDs Start: 04/05/21 1856   Code Status: Full code Family Communication: Significant other [name] on the phone. Disposition Plan: Status is: Inpatient  Remains inpatient appropriate because:Inpatient level of care appropriate due to severity of illness  Dispo: The patient is from: Home              Anticipated d/c is to: Home              Patient currently is not medically stable to d/c.   Difficult to place patient No         Consultants:  Urology Interventional radiology  Procedures:  Right percutaneous nephrostomy tube placement interventional radiology 7/22  Antimicrobials:  Gentamicin perioperative given 7/21   Subjective: Patient seen and examined.  Anxious because of frequent disturbance at night.  Has some spasms on the buttock and lower legs but nothing new for him.  He wants to get some good night sleep. Objective: Vitals:   04/06/21 1211 04/06/21 1347 04/06/21 2057 04/07/21 0540  BP: 104/74 101/61 115/66 102/65  Pulse: (!) 101 88 97 91  Resp: '18 20 18 '$ (!) 22  Temp: 97.9 F (36.6 C) 97.6 F (36.4 C) 98.2 F (36.8 C) 98.3 F (36.8 C)  TempSrc: Oral Oral  Oral  SpO2: 99% 100% 100% 99%  Weight:      Height:        Intake/Output Summary (Last 24 hours) at 04/07/2021 1105 Last data filed at 04/07/2021 1100 Gross per 24 hour  Intake 3529.62 ml  Output 5975 ml  Net -2445.38 ml   Filed Weights   04/05/21 1838  Weight: 75 kg    Examination:  General exam: Appears calm and comfortable .  On room air. Respiratory system: Clear to auscultation. Respiratory effort normal.  No added sounds. Cardiovascular system: S1 & S2 heard, RRR.  Gastrointestinal system: Soft and nontender.  Bowel sounds present. Central nervous system: Alert and oriented.  Patient has atrophic both legs with no sensations. Foley catheter with minimal clear  urine. Right-sided nephrostomy tube with clear urine. Sacrum: Stage II decubitus ulcer.  On barrier dressing.    Data Reviewed: I have personally reviewed following labs and imaging studies  CBC: Recent Labs  Lab 04/05/21 1558 04/05/21 1909 04/06/21 0342 04/07/21 0326  WBC  --  11.8* 12.6* 9.8  NEUTROABS  --   --   --  7.9*  HGB 7.1* 7.2* 7.9* 7.9*  HCT 21.0* 25.5* 26.1* 26.3*  MCV  --  81.7 81.6 81.2  PLT  --  387 371 [id number]   Basic Metabolic Panel: Recent Labs  Lab 04/02/21 1558 04/04/21 1144 04/05/21 1246 04/05/21 1558 04/06/21 0342 04/06/21 1630 04/07/21 0326  NA 134 132* 131* 132* 133*  --  139  K 5.1 5.3* 4.9 4.7 6.4* 4.7 4.7  CL 101 102 100 105 106  --  107  CO2 17* 21* 17*  --  19*  --  22  GLUCOSE 89 137* 79 116* 167*  --  114*  BUN 43* 49* 54* 50* 56*  --  52*  CREATININE 2.62* 3.51* 4.30* 4.20* 3.96*  --  3.43*  CALCIUM 10.3* 9.7 9.8  --  9.2  --  9.5  MG  --   --   --   --   --   --  1.9  PHOS  --   --   --   --   --   --  4.5   GFR: Estimated Creatinine Clearance: 20.7 mL/min (A) (by C-G formula based on SCr of 3.43 mg/dL (H)). Liver Function Tests: Recent Labs  Lab 04/06/21 0342  AST 13*  ALT 25  ALKPHOS 227*  BILITOT 1.2  PROT 6.1*  ALBUMIN 2.2*   No results for input(s): LIPASE, AMYLASE in the last 168 hours. No results for input(s): AMMONIA in the last 168 hours. Coagulation Profile: Recent Labs  Lab 04/05/21 1614  INR 1.2   Cardiac Enzymes: No results for input(s): CKTOTAL, CKMB, CKMBINDEX, TROPONINI in the last 168 hours. BNP (last 3 results) No results for input(s): PROBNP in the last 8760 hours. HbA1C: No results for input(s): HGBA1C in the last 72 hours. CBG: No results for input(s): GLUCAP in the last 168 hours. Lipid Profile: No results for input(s): CHOL, HDL, LDLCALC, TRIG, CHOLHDL, LDLDIRECT in the last 72 hours. Thyroid Function Tests: No results for input(s): TSH, T4TOTAL, FREET4, T3FREE, THYROIDAB in the last 72  hours. Anemia Panel: Recent Labs    04/05/21 2036  VITAMINB12 486  FOLATE 18.8  FERRITIN 579*  TIBC 209*  IRON 15*  RETICCTPCT 1.7   Sepsis Labs: No results for input(s): PROCALCITON, LATICACIDVEN in the last 168 hours.  Recent Results (from the past 240 hour(s))  Urine Culture     Status: None   Collection Time: 04/02/21  3:59 PM   Specimen: Urine   UR  Result Value Ref Range Status   Urine Culture, Routine Final report  Final   Organism ID, Bacteria Comment  Final    Comment: Greater than 2 organisms recovered, none predominant. Please submit another sample if clinically indicated. Greater  than 100,000 colony forming units per mL   Aerobic/Anaerobic Culture w Gram Stain (surgical/deep wound)     Status: None (Preliminary result)   Collection Time: 04/06/21 11:40 AM   Specimen: Kidney; Abscess  Result Value Ref Range Status   Specimen Description   Final    KIDNEY RT Performed at [facility] [address] [place], [place] [address]    Special Requests   Final    Normal Performed at [facility], [place] [address], [place], [place] [address]    Gram Stain   Final    ABUNDANT WBC PRESENT, PREDOMINANTLY PMN RARE GRAM NEGATIVE RODS Performed at [facility], [place] [address], [place], [place] [address]    Culture PENDING  Incomplete   Report Status PENDING  Incomplete         Radiology Studies: IR US Guide Vasc Access Left  Result Date: 04/06/2021 INDICATION: 60 year old male with squamous metaplasia in the bladder resulting in bilateral ureteral obstruction and significant right-sided hydronephrosis. He has chronic atrophy of the left kidney. EXAM: IR ULTRASOUND GUIDANCE VASC ACCESS LEFT; IR NEPHROSTOMY PLACEMENT RIGHT COMPARISON:  CT abdomen/pelvis 03/16/2021 MEDICATIONS: 2 g Rocephin; The antibiotic was administered in an appropriate time frame prior to skin puncture. ANESTHESIA/SEDATION: Fentanyl 100 mcg IV; Versed 3  mg IV Moderate Sedation Time:  25 minutes The patient was continuously monitored during the procedure by the interventional radiology nurse under my direct supervision. CONTRAST:  22m OMNIPAQUE IOHEXOL 300 MG/ML SOLN - administered into the collecting system(s) FLUOROSCOPY TIME:  Fluoroscopy Time: 1 minutes 42 seconds (17 mGy). COMPLICATIONS: None immediate. PROCEDURE: Informed written consent was obtained from the patient after a thorough discussion of the procedural risks, benefits and alternatives. All questions were addressed. Maximal Sterile Barrier Technique was utilized including caps, mask, sterile gowns, sterile gloves, sterile drape, hand hygiene and skin antiseptic. A timeout was performed prior to the initiation of the procedure. Unfortunately, peripheral IV access could not be obtained. Therefore, a tourniquet was placed on the left arm. The antecubital fossa was interrogated with ultrasound. A suitable vein was identified. The overlying skin was sterilely prepped and draped in the standard fashion with chlorhexidine skin prep. A 20 gauge IV was then started using real-time ultrasound guidance. Images of the patent vein and the IV entering the vein were obtained and stored for the medical record. The IV was then secured in place. Attention was then turned to the right flank. The right flank was interrogated with ultrasound. The right kidney is identified. There is moderate to severe hydronephrosis. Local anesthesia was attained by infiltration with 1% lidocaine. A small dermatotomy was made. Utilizing a 21 gauge Accustick needle, a posterior interpolar calyx was accessed. There was return of clear urine. Contrast injection was performed outlining the hydronephrotic renal collecting system. A wire was then advanced into the renal pelvis. The Accustick needle was removed and exchanged for the transitional sheath. The 0.018 wire was then exchanged for a 0.035 Bentson wire. The transitional dilator was  removed. The tract was dilated to 10 [place] 10.2 [place]purpose drainage catheter was advanced over the wire and formed in the renal pelvis. Aspiration yields turbid urine. A sample was sent for Gram stain and cultures given the appearance. A gentle hand injection of contrast material was then performed confirming the position of the catheter in the renal pelvis. Images were obtained and stored for the medical record. The catheter was connected to gravity bag drainage and  secured to the skin with 0 Prolene suture. The patient tolerated the procedure well. IMPRESSION: 1. Successful ultrasound-guided IV start by MD in the left antecubital fossa. 2. Successful placement of a 10 French right-sided percutaneous nephrostomy tube. PLAN: Consider having the patient return Interventional Radiology in 1-2 weeks for an attempt at internalization with antegrade double-J ureteral stent placement. Otherwise, return Interventional Radiology in 6-8 weeks for routine nephrostomy tube check and exchange. Signed, Criselda Peaches, MD, [facility] Radiology Electronically Signed   By: Jacqulynn Cadet M.D.   On: 04/06/2021 13:43   IR NEPHROSTOMY PLACEMENT RIGHT  Result Date: 04/06/2021 Criselda Peaches, MD     04/06/2021 11:55 AM Interventional Radiology Procedure Note Procedure: Right 20F percutaneous nephrostomy placed. Complications: None Estimated Blood Loss: None Recommendations: - Urine sent for culture - Tube to bag, no need to flush - Consider conversion to JJ stent in 1-2 weeks Signed, Criselda Peaches, MD        Scheduled Meds:  (feeding supplement) PROSource Plus  30 mL Oral Daily   atorvastatin  20 mg Oral Daily   Chlorhexidine Gluconate Cloth  6 each Topical Daily   feeding supplement  1 Container Oral Q24H   feeding supplement (NEPRO CARB STEADY)  237 mL Oral Q24H   omega-3 acid ethyl esters  1 g Oral Daily   Continuous  Infusions:  sodium bicarbonate 150 mEq in D5W infusion 100 mL/hr at 04/07/21 0145     LOS: 2 days    Time spent: 30 minutes    Barb Merino, MD [facility] Pager [phone]

## 2021-04-07 NOTE — Plan of Care (Signed)
°  Problem: Health Behavior/Discharge Planning: °Goal: Ability to manage health-related needs will improve °Outcome: Progressing °  °Problem: Skin Integrity: °Goal: Risk for impaired skin integrity will decrease °Outcome: Progressing °  °

## 2021-04-07 NOTE — Plan of Care (Deleted)
  Problem: Clinical Measurements: Goal: Ability to maintain clinical measurements within normal limits will improve Outcome: Progressing   Problem: Activity: Goal: Risk for activity intolerance will decrease Outcome: Progressing   Problem: Coping: Goal: Level of anxiety will decrease Outcome: Progressing   Problem: Pain Managment: Goal: General experience of comfort will improve Outcome: Progressing   

## 2021-04-07 NOTE — Plan of Care (Signed)

## 2021-04-08 DIAGNOSIS — G8220 Paraplegia, unspecified: Secondary | ICD-10-CM

## 2021-04-08 LAB — BASIC METABOLIC PANEL
Anion gap: 10 (ref 5–15)
BUN: 50 mg/dL — ABNORMAL HIGH (ref 6–20)
CO2: 27 mmol/L (ref 22–32)
Calcium: 9.1 mg/dL (ref 8.9–10.3)
Chloride: 101 mmol/L (ref 98–111)
Creatinine, Ser: 2.93 mg/dL — ABNORMAL HIGH (ref 0.61–1.24)
GFR, Estimated: 24 mL/min — ABNORMAL LOW (ref >60)
Glucose, Bld: 131 mg/dL — ABNORMAL HIGH (ref 70–99)
Potassium: 3.9 mmol/L (ref 3.5–5.1)
Sodium: 138 mmol/L (ref 135–145)

## 2021-04-08 NOTE — Discharge Summary (Signed)
Physician Discharge Summary  Thomas Schroeder [id number] DOB: [birth year] DOA: 04/05/2021  PCP: Hali Marry, MD  Admit date: 04/05/2021 Discharge date: 04/08/2021  Admitted From: Home Disposition: Home  Recommendations for Outpatient Follow-up:  Follow up with PCP in 1-2 weeks Please obtain BMP/CBC in one week Urology will schedule follow-up for further care.  Home Health: Not applicable Equipment/Devices: Not applicable  Discharge Condition: Stable CODE STATUS: Full code Diet recommendation: Regular diet  Discharge summary: 60 year old gentleman with history of traumatic paraplegia, neurogenic bladder and chronic indwelling Foley catheter, stage II pressure ulcer in the sacrum, hyperlipidemia and anxiety disorder was brought to [facility] by urologist for cystoscopy and change of catheter with ongoing worsening renal functions and hydronephrosis.  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  With worsening renal functions and hydronephrosis, he was transferred to [facility] for IR to place nephrostomy tube.  Acute urinary obstruction secondary to bladder tumor, hydronephrosis right more than left.  Atrophic left kidney, neurogenic bladder and chronic Foley catheter: Right-sided PCN by IR 7/22 with good response.  Left kidney is atrophic. Continue Foley catheter. Urology to schedule follow-up.  Probably will need internalization of nephrostomy tube.   Acute renal failure with hyperkalemia: Recently known normal kidney functions.  Worsening kidney function secondary to bladder obstruction. After relief of obstruction, his renal functions are gradually improving.  Potassium is normalized.  Anticipate gradual improvement, will recommend recheck in 1 week to ensure stabilization.  Has adequate oral intake.   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  Lives  at home with wife.   Acute blood loss anemia: Secondary to hematuria.  Hemoglobin 7.1.  Transfused 1 unit overnight with appropriate response.  Recheck in 1 week.   Sacral decubitus ulcer stage II present on admission: Seen by wound care.  Recommended local wound care and barrier dressing.  Dressing instructions attached.  Patient medically stabilized.  He will need follow-up with IR and urology, they will schedule.  Stable to be discharged home.    Discharge Diagnoses:  Active Problems:   Paraplegia ([place])   History of decubitus ulcer   Urinary bladder neurogenic dysfunction   Alcohol use   Anxiety   Muscle spasticity   Obstructive uropathy   Pressure injury of skin    Discharge Instructions  Discharge Instructions     Diet general   Complete by: As directed    Discharge instructions   Complete by: As directed    Foley catheter and right side tube to free flow . No need to flush if flowing freely with plenty of urine. If you see any blood clots, can flush with 5 ml prefilled normal saline.  Call Urology office if experiencing any problems.   Discharge wound care:   Complete by: As directed    Wound care to stage 2 sacral pressure injury (POA):  cleanse with NS, pat dry. Cover with silver hydrofiber (Aquacel Ag+ Advantage, Lawson # [id number]), top with dry gauze 2x2 and top with silicone foam for sacrum.  Change Aquacel Ag+ Advantage daily, may reuse silicone foam for up to 3 days.   Increase activity slowly   Complete by: As directed       Allergies as of 04/08/2021       Reactions   Bactrim Rash   Skin sloughing    Ropinirole Other (See Comments)   Increased leg spasms/poor sleep.  Latex Rash        Medication List     STOP taking these medications    aspirin 325 MG tablet   Clenpiq 10-3.5-12 MG-GM -GM/160ML Soln Generic drug: Sod Picosulfate-Mag Ox-Cit Acd   naproxen sodium 220 MG tablet Commonly known as: ALEVE       TAKE these medications     acetaminophen 500 MG tablet Commonly known as: TYLENOL Take 1,000 mg by mouth every 6 (six) hours as needed for moderate pain or headache.   atorvastatin 20 MG tablet Commonly known as: LIPITOR TAKE 1 TABLET AT BEDTIME What changed: when to take this   CRANBERRY EXTRACT PO Take 4,200 mg by mouth daily.   Fish Oil 1200 MG Caps Take 1,200 mg by mouth daily.   MULTI VITAMIN MENS PO Take 1 tablet by mouth daily.               Discharge Care Instructions  (From admission, onward)           Start     Ordered   04/08/21 0000  Discharge wound care:       Comments: Wound care to stage 2 sacral pressure injury (POA):  cleanse with NS, pat dry. Cover with silver hydrofiber (Aquacel Ag+ Advantage, Lawson # [id number]), top with dry gauze 2x2 and top with silicone foam for sacrum.  Change Aquacel Ag+ Advantage daily, may reuse silicone foam for up to 3 days.   04/08/21 [id number]            Follow-up Information     McKenzie, Candee Furbish, MD Follow up.   Specialty: Urology Contact information: [place] [address] [phone]                Allergies  Allergen Reactions   Bactrim Rash    Skin sloughing    Ropinirole Other (See Comments)    Increased leg spasms/poor sleep.    Latex Rash    Consultations: Interventional radiology urology   Procedures/Studies: CT ABDOMEN PELVIS WO CONTRAST  Result Date: 03/18/2021 CLINICAL DATA:  Hematuria EXAM: CT ABDOMEN AND PELVIS WITHOUT CONTRAST TECHNIQUE: Multidetector CT imaging of the abdomen and pelvis was performed following the standard protocol without IV contrast. COMPARISON:  None. FINDINGS: Lower chest: Lung bases are clear. Hepatobiliary: No focal hepatic lesion. No biliary duct dilatation. Common bile duct is normal. Pancreas: Pancreas is normal. No ductal dilatation. No pancreatic inflammation. Spleen: Normal spleen Adrenals/urinary tract: Adrenal glands normal. LEFT kidney is  atrophic. This compensatory hypertrophy of the RIGHT kidney. There is bilateral hydroureter extending to level of the vesicoureteral junctions. No obstructing lesion identified. 3 small nonobstructing 1 mm calculi in lower pole of the RIGHT kidney. There is Foley catheter in the bladder. There is dense echogenic material within the lumen the bladder with scattered gas within the lumen. Bladder wall appears thickened. Stomach/Bowel: Stomach, small bowel cecum normal. Colon rectosigmoid colon normal. Vascular/Lymphatic:  calcification of the abdominal aorta. There is a ovoid lesion in the LEFT pelvis adjacent to the operator space measuring 3.4 by 3.0 cm. Lesion measures 6.2 cm in craniocaudad dimension (sagittal image 85/series 6). Reproductive: Prostate unremarkable. Bilateral hip prosthetics limited evaluation of the pelvis. Other: No free fluid. Musculoskeletal: No aggressive osseous lesion. IMPRESSION: 1. Bilateral hydroureter without obstructing lesion identified. Hydroureter extends to the level of the vascular junctions. There is echogenic material within the lumen of the bladder with scattered pockets of gas. Presumably this echogenic material obstructs the  ureters. Chronic Foley catheter in bladder 2. Atrophic LEFT kidney and hypertrophied RIGHT kidney. 3. Several small nonobstructing calculi present lower pole the RIGHT kidney. 4. Ovoid lesion in the LEFT operator space. Differential includes enlarged necrotic lymph node versus vascular structure such as a venous varix. Consider contrast enhanced imaging for further evaluation. Electronically Signed   By: Suzy Bouchard M.D.   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 He has chronic atrophy of the left kidney. EXAM: IR ULTRASOUND GUIDANCE VASC ACCESS LEFT; IR  NEPHROSTOMY PLACEMENT RIGHT COMPARISON:  CT abdomen/pelvis 03/16/2021 MEDICATIONS: 2 g Rocephin; The antibiotic was administered in an appropriate time frame prior to skin puncture. ANESTHESIA/SEDATION: Fentanyl 100 mcg IV; Versed 3 mg IV Moderate Sedation Time:  25 minutes The patient was continuously monitored during the procedure by the interventional radiology nurse under my direct supervision. CONTRAST:  36m OMNIPAQUE IOHEXOL 300 MG/ML SOLN - administered into the collecting system(s) FLUOROSCOPY TIME:  Fluoroscopy Time: 1 minutes 42 seconds (17 mGy). COMPLICATIONS: None immediate. PROCEDURE: Informed written consent was obtained from the patient after a thorough discussion of the procedural risks, benefits and alternatives. All questions were addressed. Maximal Sterile Barrier Technique was utilized including caps, mask, sterile gowns, sterile gloves, sterile drape, hand hygiene and skin antiseptic. A timeout was performed prior to the initiation of the procedure. Unfortunately, peripheral IV access could not be obtained. Therefore, a tourniquet was placed on the left arm. The antecubital fossa was interrogated with ultrasound. A suitable vein was identified. The overlying skin was sterilely prepped and draped in the standard fashion with chlorhexidine skin prep. A 20 gauge IV was then started using real-time ultrasound guidance. Images of the patent vein and the IV entering the vein were obtained and stored for the medical record. The IV was then secured in place. Attention was then turned to the right flank. The right flank was interrogated with ultrasound. The right kidney is identified. There is moderate to severe hydronephrosis. Local anesthesia was attained by infiltration with 1% lidocaine. A small dermatotomy was made. Utilizing a 21 gauge Accustick needle, a posterior interpolar calyx was accessed. There was return of clear urine. Contrast injection was performed outlining the hydronephrotic renal  collecting system. A wire was then advanced into the renal pelvis. The Accustick needle was removed and exchanged for the transitional sheath. The 0.018 wire was then exchanged for a 0.035 Bentson wire. The transitional dilator was removed. The tract was dilated to 10 [place] 10.2 [place]-purpose drainage catheter was advanced over the wire and formed in the renal pelvis. Aspiration yields turbid urine. A sample was sent for Gram stain and cultures given the appearance. A gentle hand injection of contrast material was then performed confirming the position of the catheter in the renal pelvis. Images were obtained and stored for the medical record. The catheter was connected to gravity bag drainage and secured to the skin with 0 Prolene suture. The patient tolerated the procedure well. IMPRESSION: 1. Successful ultrasound-guided IV start by MD in the left antecubital fossa. 2. Successful placement of a 10 French right-sided percutaneous nephrostomy tube. PLAN: Consider having the patient return Interventional Radiology in 1-2 weeks for an attempt at internalization with antegrade double-J ureteral stent placement. Otherwise, return Interventional Radiology in 6-8 weeks for routine nephrostomy tube check and exchange. Signed, HCriselda Peaches MD,  [facility] Electronically Signed   By: Jacqulynn Cadet M.D.   On: 04/06/2021 13:43   IR NEPHROSTOMY PLACEMENT RIGHT  Result Date: 04/06/2021 Criselda Peaches, MD     04/06/2021 11:55 AM Interventional Radiology Procedure Note Procedure: Right 47F percutaneous nephrostomy placed. Complications: None Estimated Blood Loss: None Recommendations: - Urine sent for culture - Tube to bag, no need to flush - Consider conversion to JJ stent in 1-2 weeks Signed, Criselda Peaches, MD   (Echo, Carotid, EGD, Colonoscopy, ERCP)    Subjective: Patient seen and examined.  Denies any complaints.  Has  some pink tinge on his nephrostomy catheter.  Eager to go home.  Without any nausea or vomiting.  Taking adequate oral intake.   Discharge Exam: Vitals:   04/07/21 0540 04/07/21 1216  BP: 102/65 104/61  Pulse: 91 97  Resp: (!) 22 16  Temp: 98.3 F (36.8 C) 98.2 F (36.8 C)  SpO2: 99% 99%   Vitals:   04/06/21 1347 04/06/21 2057 04/07/21 0540 04/07/21 1216  BP: 101/61 115/66 102/65 104/61  Pulse: 88 97 91 97  Resp: 20 18 (!) 22 16  Temp: 97.6 F (36.4 C) 98.2 F (36.8 C) 98.3 F (36.8 C) 98.2 F (36.8 C)  TempSrc: Oral  Oral   SpO2: 100% 100% 99% 99%  Weight:      Height:        General: Pt is alert, awake, not in acute distress Cardiovascular: RRR, S1/S2 +, no rubs, no gallops Respiratory: CTA bilaterally, no wheezing, no rhonchi Abdominal: Soft, NT, ND, bowel sounds + Nephrostomy tube with bladder full of urine, slight pink tinge in the urine. Foley catheter with minimal urine present.    The results of significant diagnostics from this hospitalization (including imaging, microbiology, ancillary and laboratory) are listed below for reference.     Microbiology: Recent Results (from the past 240 hour(s))  Urine Culture     Status: None   Collection Time: 04/02/21  3:59 PM   Specimen: Urine   UR  Result Value Ref Range Status   Urine Culture, Routine Final report  Final   Organism ID, Bacteria Comment  Final    Comment: Greater than 2 organisms recovered, none predominant. Please submit another sample if clinically indicated. Greater than 100,000 colony forming units per mL   Aerobic/Anaerobic Culture w Gram Stain (surgical/deep wound)     Status: None (Preliminary result)   Collection Time: 04/06/21 11:40 AM   Specimen: Kidney; Abscess  Result Value Ref Range Status   Specimen Description   Final    KIDNEY RT Performed at [facility] [address] [place], [place] [address]    Special Requests   Final    Normal Performed at [facility], [place] [address], [place], [place] [address]    Gram Stain   Final    ABUNDANT WBC PRESENT, PREDOMINANTLY PMN RARE GRAM NEGATIVE RODS    Culture   Final    CULTURE REINCUBATED FOR BETTER GROWTH Performed at [facility], [place] [address] [place], [place] [address]    Report Status PENDING  Incomplete     Labs: BNP (last 3 results) No results for input(s): BNP in the last 8760 hours. Basic Metabolic Panel: Recent Labs  Lab 04/04/21 1144 04/05/21 1246 04/05/21 1558 04/06/21 0342 04/06/21 1630 04/07/21 0326 04/08/21 0357  NA 132* 131* 132* 133*  --  139 138  K 5.3*  4.9 4.7 6.4* 4.7 4.7 3.9  CL 102 100 105 106  --  107 101  CO2 21* 17*  --  19*  --  22 27  GLUCOSE 137* 79 116* 167*  --  114* 131*  BUN 49* 54* 50* 56*  --  52* 50*  CREATININE 3.51* 4.30* 4.20* 3.96*  --  3.43* 2.93*  CALCIUM 9.7 9.8  --  9.2  --  9.5 9.1  MG  --   --   --   --   --  1.9  --   PHOS  --   --   --   --   --  4.5  --    Liver Function Tests: Recent Labs  Lab 04/06/21 0342  AST 13*  ALT 25  ALKPHOS 227*  BILITOT 1.2  PROT 6.1*  ALBUMIN 2.2*   No results for input(s): LIPASE, AMYLASE in the last 168 hours. No results for input(s): AMMONIA in the last 168 hours. CBC: Recent Labs  Lab 04/05/21 1558 04/05/21 1909 04/06/21 0342 04/07/21 0326  WBC  --  11.8* 12.6* 9.8  NEUTROABS  --   --   --  7.9*  HGB 7.1* 7.2* 7.9* 7.9*  HCT 21.0* 25.5* 26.1* 26.3*  MCV  --  81.7 81.6 81.2  PLT  --  387 371 400   Cardiac Enzymes: No results for input(s): CKTOTAL, CKMB, CKMBINDEX, TROPONINI in the last 168 hours. BNP: Invalid input(s): POCBNP CBG: No results for input(s): GLUCAP in the last 168 hours. D-Dimer No results for input(s): DDIMER in the last 72 hours. Hgb A1c No results for input(s): HGBA1C in the last 72 hours. Lipid Profile No results for input(s): CHOL, HDL, LDLCALC, TRIG, CHOLHDL, LDLDIRECT in the last 72 hours. Thyroid function studies No  results for input(s): TSH, T4TOTAL, T3FREE, THYROIDAB in the last 72 hours.  Invalid input(s): FREET3 Anemia work up Recent Labs    04/05/21 2036  VITAMINB12 486  FOLATE 18.8  FERRITIN 579*  TIBC 209*  IRON 15*  RETICCTPCT 1.7   Urinalysis    Component Value Date/Time   COLORURINE YELLOW 04/05/2021 2030   APPEARANCEUR CLOUDY (A) 04/05/2021 2030   LABSPEC 1.005 04/05/2021 2030   PHURINE 6.0 04/05/2021 2030   GLUCOSEU NEGATIVE 04/05/2021 2030   HGBUR LARGE (A) 04/05/2021 2030   HGBUR large 06/19/2010 [place] 04/05/2021 2030   BILIRUBINUR negative 01/08/2021 1117   BILIRUBINUR Negative 08/27/2018 0920   KETONESUR NEGATIVE 04/05/2021 2030   PROTEINUR 30 (A) 04/05/2021 2030   UROBILINOGEN 0.2 01/08/2021 1117   UROBILINOGEN 0.2 06/19/2010 1630   NITRITE NEGATIVE 04/05/2021 2030   LEUKOCYTESUR LARGE (A) 04/05/2021 2030   Sepsis Labs Invalid input(s): PROCALCITONIN,  WBC,  LACTICIDVEN Microbiology Recent Results (from the past 240 hour(s))  Urine Culture     Status: None   Collection Time: 04/02/21  3:59 PM   Specimen: Urine   UR  Result Value Ref Range Status   Urine Culture, Routine Final report  Final   Organism ID, Bacteria Comment  Final    Comment: Greater than 2 organisms recovered, none predominant. Please submit another sample if clinically indicated. Greater than 100,000 colony forming units per mL   Aerobic/Anaerobic Culture w Gram Stain (surgical/deep wound)     Status: None (Preliminary result)   Collection Time: 04/06/21 11:40 AM   Specimen: Kidney; Abscess  Result Value Ref Range Status   Specimen Description   Final    KIDNEY RT  Performed at [facility], [place] [address], [place], [place] [address]    Special Requests   Final    Normal Performed at [facility], [place] [address] [place], [place] [address]    Gram Stain   Final    ABUNDANT WBC PRESENT, PREDOMINANTLY PMN RARE GRAM NEGATIVE  RODS    Culture   Final    CULTURE REINCUBATED FOR BETTER GROWTH Performed at [facility], [place] [address] [place],  [address]    Report Status PENDING  Incomplete     Time coordinating discharge:  35 minutes  SIGNED:   Barb Merino, MD  [facility] 04/08/2021, 11:28 AM

## 2021-04-08 NOTE — Progress Notes (Signed)
Pt discharge to home.  Discharge instructions reviewed with pt and spouse.  Pt and spouse verbalized understanding.

## 2021-04-08 NOTE — Plan of Care (Signed)

## 2021-04-09 ENCOUNTER — Telehealth: Payer: Self-pay

## 2021-04-09 LAB — SURGICAL PATHOLOGY

## 2021-04-09 NOTE — Telephone Encounter (Signed)
Transition Care Management Unsuccessful Follow-up Telephone Call  Date of discharge and from where:  04/08/2021  Attempts:  1st Attempt  Reason for unsuccessful TCM follow-up call:  Left voice message

## 2021-04-10 NOTE — Telephone Encounter (Addendum)
Transition Care Management Unsuccessful Follow-up Telephone Call  Date of discharge and from where:  04/08/21 from [facility]  Attempts:  2nd Attempt  Reason for unsuccessful TCM follow-up call:  Left voice message

## 2021-04-11 LAB — AEROBIC/ANAEROBIC CULTURE W GRAM STAIN (SURGICAL/DEEP WOUND): Special Requests: NORMAL

## 2021-04-11 NOTE — Telephone Encounter (Signed)
Transition Care Management Unsuccessful Follow-up Telephone Call  Date of discharge and from where:  04/08/21 from [facility]  Attempts:  3rd Attempt  Reason for unsuccessful TCM follow-up call:  Left voice message

## 2021-04-16 ENCOUNTER — Ambulatory Visit (INDEPENDENT_AMBULATORY_CARE_PROVIDER_SITE_OTHER): Payer: BC Managed Care – PPO | Admitting: Urology

## 2021-04-16 ENCOUNTER — Other Ambulatory Visit: Payer: Self-pay

## 2021-04-16 ENCOUNTER — Other Ambulatory Visit: Payer: BC Managed Care – PPO | Admitting: Urology

## 2021-04-16 VITALS — BP 108/74 | HR 103

## 2021-04-16 DIAGNOSIS — N319 Neuromuscular dysfunction of bladder, unspecified: Secondary | ICD-10-CM

## 2021-04-16 DIAGNOSIS — N1330 Unspecified hydronephrosis: Secondary | ICD-10-CM

## 2021-04-16 DIAGNOSIS — C678 Malignant neoplasm of overlapping sites of bladder: Secondary | ICD-10-CM | POA: Diagnosis not present

## 2021-04-16 LAB — CALCULI, WITH PHOTOGRAPH (CLINICAL LAB): Weight Calculi: 4414 mg
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
## 2021-04-16 NOTE — Progress Notes (Signed)
Urological Symptom Review  Patient is experiencing the following symptoms: Blood in urine   Review of Systems  Gastrointestinal (upper)  : Negative for upper GI symptoms  Gastrointestinal (lower) : Negative for lower GI symptoms  Constitutional : Fatigue  Skin: Negative for skin symptoms  Eyes: Negative for eye symptoms  Ear/Nose/Throat : Negative for Ear/Nose/Throat symptoms  Hematologic/Lymphatic: Negative for Hematologic/Lymphatic symptoms  Cardiovascular : Negative for cardiovascular symptoms  Respiratory : Negative for respiratory symptoms  Endocrine: Negative for endocrine symptoms  Musculoskeletal: Negative for musculoskeletal symptoms  Neurological: Negative for neurological symptoms  Psychologic: Negative for psychiatric symptoms

## 2021-04-17 ENCOUNTER — Telehealth: Payer: Self-pay | Admitting: Urology

## 2021-04-17 ENCOUNTER — Other Ambulatory Visit: Payer: Self-pay | Admitting: Urology

## 2021-04-17 DIAGNOSIS — C678 Malignant neoplasm of overlapping sites of bladder: Secondary | ICD-10-CM

## 2021-04-17 LAB — COMPREHENSIVE METABOLIC PANEL
ALT: 15 IU/L (ref 0–44)
AST: 14 IU/L (ref 0–40)
Albumin/Globulin Ratio: 1.2 (ref 1.2–2.2)
Albumin: 4.2 g/dL (ref 3.8–4.9)
Alkaline Phosphatase: 198 IU/L — ABNORMAL HIGH (ref 44–121)
BUN/Creatinine Ratio: 17 (ref 10–24)
BUN: 29 mg/dL — ABNORMAL HIGH (ref 8–27)
Bilirubin Total: 0.3 mg/dL (ref 0.0–1.2)
CO2: 18 mmol/L — ABNORMAL LOW (ref 20–29)
Calcium: 12.3 mg/dL — ABNORMAL HIGH (ref 8.6–10.2)
Chloride: 103 mmol/L (ref 96–106)
Creatinine, Ser: 1.67 mg/dL — ABNORMAL HIGH (ref 0.76–1.27)
Globulin, Total: 3.5 g/dL (ref 1.5–4.5)
Glucose: 98 mg/dL (ref 65–99)
Potassium: 4.8 mmol/L (ref 3.5–5.2)
Sodium: 135 mmol/L (ref 134–144)
Total Protein: 7.7 g/dL (ref 6.0–8.5)
eGFR: 47 mL/min/{1.73_m2} — ABNORMAL LOW (ref >59)

## 2021-04-17 NOTE — Progress Notes (Signed)
Results sent my chart.

## 2021-04-17 NOTE — Telephone Encounter (Signed)
CT pelvis ordered to eval left pelvic lesion (lymph node vs vascular)

## 2021-04-17 NOTE — Progress Notes (Unsigned)
His creatinine is much better. Set up for CT pelvis to eval left pelvic lesions (LAD vs vascular)

## 2021-04-18 ENCOUNTER — Encounter (HOSPITAL_COMMUNITY): Payer: Self-pay
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
## 2021-04-25 ENCOUNTER — Inpatient Hospital Stay (HOSPITAL_COMMUNITY): Payer: BC Managed Care – PPO

## 2021-04-25 ENCOUNTER — Other Ambulatory Visit: Payer: Self-pay

## 2021-04-25 ENCOUNTER — Inpatient Hospital Stay (HOSPITAL_COMMUNITY): Payer: BC Managed Care – PPO | Attending: Hematology | Admitting: Hematology

## 2021-04-25 ENCOUNTER — Encounter (HOSPITAL_COMMUNITY): Payer: Self-pay | Admitting: Hematology

## 2021-04-25 VITALS — BP 106/65 | HR 100 | Temp 96.7°F | Resp 18

## 2021-04-25 DIAGNOSIS — Z801 Family history of malignant neoplasm of trachea, bronchus and lung: Secondary | ICD-10-CM | POA: Diagnosis not present

## 2021-04-25 DIAGNOSIS — Z79899 Other long term (current) drug therapy: Secondary | ICD-10-CM | POA: Diagnosis not present

## 2021-04-25 DIAGNOSIS — N189 Chronic kidney disease, unspecified: Secondary | ICD-10-CM | POA: Insufficient documentation

## 2021-04-25 DIAGNOSIS — C679 Malignant neoplasm of bladder, unspecified: Secondary | ICD-10-CM | POA: Insufficient documentation

## 2021-04-25 LAB — CBC WITH DIFFERENTIAL/PLATELET
Abs Immature Granulocytes: 0.07 10*3/uL (ref 0.00–0.07)
Basophils Absolute: 0.1 10*3/uL (ref 0.0–0.1)
Basophils Relative: 1 %
Eosinophils Absolute: 0.5 10*3/uL (ref 0.0–0.5)
Eosinophils Relative: 5 %
HCT: 32.3 % — ABNORMAL LOW (ref 39.0–52.0)
Hemoglobin: 9.6 g/dL — ABNORMAL LOW (ref 13.0–17.0)
Immature Granulocytes: 1 %
Lymphocytes Relative: 10 %
Lymphs Abs: 1.1 10*3/uL (ref 0.7–4.0)
MCH: 25.3 pg — ABNORMAL LOW (ref 26.0–34.0)
MCHC: 29.7 g/dL — ABNORMAL LOW (ref 30.0–36.0)
MCV: 85 fL (ref 80.0–100.0)
Monocytes Absolute: 1.1 10*3/uL — ABNORMAL HIGH (ref 0.1–1.0)
Monocytes Relative: 10 %
Neutro Abs: 7.7 10*3/uL (ref 1.7–7.7)
Neutrophils Relative %: 73 %
Platelets: 460 10*3/uL — ABNORMAL HIGH (ref 150–400)
RBC: 3.8 MIL/uL — ABNORMAL LOW (ref 4.22–5.81)
RDW: 18.7 % — ABNORMAL HIGH (ref 11.5–15.5)
WBC: 10.6 10*3/uL — ABNORMAL HIGH (ref 4.0–10.5)
nRBC: 0 % (ref 0.0–0.2)

## 2021-04-25 LAB — COMPREHENSIVE METABOLIC PANEL
ALT: 63 U/L — ABNORMAL HIGH (ref 0–44)
AST: 32 U/L (ref 15–41)
Albumin: 3.1 g/dL — ABNORMAL LOW (ref 3.5–5.0)
Alkaline Phosphatase: 218 U/L — ABNORMAL HIGH (ref 38–126)
Anion gap: 9 (ref 5–15)
BUN: 23 mg/dL — ABNORMAL HIGH (ref 6–20)
CO2: 20 mmol/L — ABNORMAL LOW (ref 22–32)
Calcium: 11.3 mg/dL — ABNORMAL HIGH (ref 8.9–10.3)
Chloride: 104 mmol/L (ref 98–111)
Creatinine, Ser: 1.72 mg/dL — ABNORMAL HIGH (ref 0.61–1.24)
GFR, Estimated: 45 mL/min — ABNORMAL LOW (ref >60)
Glucose, Bld: 88 mg/dL (ref 70–99)
Potassium: 4.6 mmol/L (ref 3.5–5.1)
Sodium: 133 mmol/L — ABNORMAL LOW (ref 135–145)
Total Bilirubin: 0.3 mg/dL (ref 0.3–1.2)
Total Protein: 7.8 g/dL (ref 6.5–8.1)

## 2021-04-25 LAB — TSH: TSH: 3.422 u[IU]/mL (ref 0.350–4.500)

## 2021-04-25 NOTE — Patient Instructions (Addendum)
[place] at [facility] Discharge Instructions  You were seen and examined today by Dr. Delton Coombes. Dr. Delton Coombes is a medical oncologist, meaning that he treats cancer diagnoses with medications. Dr. Delton Coombes discussed your past medical history, family history of cancer and the events that led to you being here today.  Dr. Delton Coombes reviewed your recent biopsy results, which revealed a type of cancer present in your bladder. You were referred here to determine if there is a role for chemotherapy prior to surgery. Dr. Delton Coombes has recommended additional lab work today to recheck your kidney function and calcium level. Dr. Delton Coombes has also recommended a PET scan. A PET scan is a specialized CT scan that illuminates where there is cancer present in the body.  The chemotherapy that is typically used for this type of cancer is Cisplatin. This is difficult on the kidneys so this is a concern given your recent kidney function.  Dr. Delton Coombes will follow-up with you after the PET scan.   Thank you for choosing [place] at [facility] to provide your oncology and hematology care.  To afford each patient quality time with our provider, please arrive at least 15 minutes before your scheduled appointment time.   If you have a lab appointment with the [facility] please come in thru the Main Entrance and check in at the main information desk.  You need to re-schedule your appointment should you arrive 10 or more minutes late.  We strive to give you quality time with our providers, and arriving late affects you and other patients whose appointments are after yours.  Also, if you no show three or more times for appointments you may be dismissed from the clinic at the providers discretion.     Again, thank you for choosing [facility].  Our hope is that these requests will decrease the amount of time that you wait before being seen by our  physicians.       _____________________________________________________________  Should you have questions after your visit to [facility], please contact our office at [phone] and follow the prompts.  Our office hours are 8:00 a.m. and 4:30 p.m. Monday - Friday.  Please note that voicemails left after 4:00 p.m. may not be returned until the following business day.  We are closed weekends and major holidays.  You do have access to a nurse 24-7, just call the main number to the clinic [phone] and do not press any options, hold on the line and a nurse will answer the phone.    For prescription refill requests, have your pharmacy contact our office and allow 72 hours.    Due to Covid, you will need to wear a mask upon entering the hospital. If you do not have a mask, a mask will be given to you at the Main Entrance upon arrival. For doctor visits, patients may have 1 support person age 70 or older with them. For treatment visits, patients can not have anyone with them due to social distancing guidelines and our immunocompromised population.

## 2021-04-25 NOTE — Progress Notes (Signed)
I met with the patient and his significant other today during and following visit with Dr. Delton Coombes. I introduced myself and explained my role in the patient's care. I provided my contact information and encouraged the patient to call with any questions or concerns.

## 2021-04-26 LAB — KAPPA/LAMBDA LIGHT CHAINS
Kappa free light chain: 97.9 mg/L — ABNORMAL HIGH (ref 3.3–19.4)
Kappa, lambda light chain ratio: 1.55 (ref 0.26–1.65)
Lambda free light chains: 63.2 mg/L — ABNORMAL HIGH (ref 5.7–26.3)

## 2021-04-26 LAB — PTH, INTACT AND CALCIUM
Calcium, Total (PTH): 11.5 mg/dL — ABNORMAL HIGH (ref 8.6–10.2)
PTH: 4 pg/mL — ABNORMAL LOW (ref 15–65)

## 2021-04-27 LAB — PROTEIN ELECTROPHORESIS, SERUM
A/G Ratio: 0.8 (ref 0.7–1.7)
Albumin ELP: 3 g/dL (ref 2.9–4.4)
Alpha-1-Globulin: 0.5 g/dL — ABNORMAL HIGH (ref 0.0–0.4)
Alpha-2-Globulin: 1.2 g/dL — ABNORMAL HIGH (ref 0.4–1.0)
Beta Globulin: 0.9 g/dL (ref 0.7–1.3)
Gamma Globulin: 1.4 g/dL (ref 0.4–1.8)
Globulin, Total: 4 g/dL — ABNORMAL HIGH (ref 2.2–3.9)
M-Spike, %: 0.3 g/dL — ABNORMAL HIGH
Total Protein ELP: 7 g/dL (ref 6.0–8.5)

## 2021-04-29 LAB — 25-HYDROXY VITAMIN D LCMS D2+D3
25-Hydroxy, Vitamin D-2: 1.3 ng/mL
25-Hydroxy, Vitamin D-3: 48 ng/mL
25-Hydroxy, Vitamin D: 49 ng/mL

## 2021-04-30 LAB — IMMUNOFIXATION ELECTROPHORESIS
IgA: 228 mg/dL (ref 90–386)
IgG (Immunoglobin G), Serum: 1468 mg/dL (ref 603–1613)
IgM (Immunoglobulin M), Srm: 65 mg/dL (ref 20–172)
Total Protein ELP: 7 g/dL (ref 6.0–8.5)

## 2021-05-03 ENCOUNTER — Other Ambulatory Visit: Payer: Self-pay

## 2021-05-03 ENCOUNTER — Ambulatory Visit (HOSPITAL_COMMUNITY)
Admission: RE | Admit: 2021-05-03 | Discharge: 2021-05-03 | Disposition: A | Discharge status: Home / Self Care | Payer: BC Managed Care – PPO | Source: Ambulatory Visit | Attending: Hematology | Admitting: Hematology

## 2021-05-03 DIAGNOSIS — C679 Malignant neoplasm of bladder, unspecified: Secondary | ICD-10-CM | POA: Diagnosis not present

## 2021-05-03 DIAGNOSIS — N3289 Other specified disorders of bladder: Secondary | ICD-10-CM | POA: Diagnosis not present

## 2021-05-03 DIAGNOSIS — N261 Atrophy of kidney (terminal): Secondary | ICD-10-CM | POA: Diagnosis not present

## 2021-05-03 DIAGNOSIS — N1330 Unspecified hydronephrosis: Secondary | ICD-10-CM | POA: Diagnosis not present

## 2021-05-03 MED ORDER — FLUDEOXYGLUCOSE F - 18 (FDG) INJECTION
8.3600 | Freq: Once | INTRAVENOUS | Status: AC | PRN
  Administered 2021-05-03: 8.36 via INTRAVENOUS

## 2021-05-07 ENCOUNTER — Emergency Department (HOSPITAL_COMMUNITY): Payer: BC Managed Care – PPO

## 2021-05-07 ENCOUNTER — Other Ambulatory Visit: Payer: Self-pay

## 2021-05-07 ENCOUNTER — Encounter (HOSPITAL_COMMUNITY): Payer: Self-pay

## 2021-05-07 ENCOUNTER — Inpatient Hospital Stay (HOSPITAL_COMMUNITY)
Admission: EM | Admit: 2021-05-07 | Discharge: 2021-05-15 | DRG: 698 | Disposition: A | Discharge status: Home / Self Care | Payer: BC Managed Care – PPO | Attending: Internal Medicine | Admitting: Internal Medicine

## 2021-05-07 DIAGNOSIS — R579 Shock, unspecified: Secondary | ICD-10-CM | POA: Diagnosis not present

## 2021-05-07 DIAGNOSIS — Z91040 Latex allergy status: Secondary | ICD-10-CM

## 2021-05-07 DIAGNOSIS — C679 Malignant neoplasm of bladder, unspecified: Secondary | ICD-10-CM | POA: Diagnosis not present

## 2021-05-07 DIAGNOSIS — R739 Hyperglycemia, unspecified: Secondary | ICD-10-CM | POA: Diagnosis not present

## 2021-05-07 DIAGNOSIS — S37002S Unspecified injury of left kidney, sequela: Secondary | ICD-10-CM

## 2021-05-07 DIAGNOSIS — E861 Hypovolemia: Secondary | ICD-10-CM | POA: Diagnosis present

## 2021-05-07 DIAGNOSIS — N318 Other neuromuscular dysfunction of bladder: Secondary | ICD-10-CM | POA: Diagnosis present

## 2021-05-07 DIAGNOSIS — N1330 Unspecified hydronephrosis: Secondary | ICD-10-CM | POA: Diagnosis not present

## 2021-05-07 DIAGNOSIS — S24103S Unspecified injury at T7-T10 level of thoracic spinal cord, sequela: Secondary | ICD-10-CM

## 2021-05-07 DIAGNOSIS — A419 Sepsis, unspecified organism: Secondary | ICD-10-CM | POA: Diagnosis not present

## 2021-05-07 DIAGNOSIS — Z87440 Personal history of urinary (tract) infections: Secondary | ICD-10-CM

## 2021-05-07 DIAGNOSIS — A411 Sepsis due to other specified staphylococcus: Secondary | ICD-10-CM | POA: Diagnosis present

## 2021-05-07 DIAGNOSIS — R310 Gross hematuria: Secondary | ICD-10-CM | POA: Diagnosis present

## 2021-05-07 DIAGNOSIS — N1832 Chronic kidney disease, stage 3b: Secondary | ICD-10-CM | POA: Diagnosis present

## 2021-05-07 DIAGNOSIS — E785 Hyperlipidemia, unspecified: Secondary | ICD-10-CM | POA: Diagnosis present

## 2021-05-07 DIAGNOSIS — Z888 Allergy status to other drugs, medicaments and biological substances status: Secondary | ICD-10-CM

## 2021-05-07 DIAGNOSIS — N261 Atrophy of kidney (terminal): Secondary | ICD-10-CM | POA: Diagnosis not present

## 2021-05-07 DIAGNOSIS — C772 Secondary and unspecified malignant neoplasm of intra-abdominal lymph nodes: Secondary | ICD-10-CM | POA: Diagnosis present

## 2021-05-07 DIAGNOSIS — I959 Hypotension, unspecified: Secondary | ICD-10-CM | POA: Diagnosis not present

## 2021-05-07 DIAGNOSIS — N179 Acute kidney failure, unspecified: Secondary | ICD-10-CM | POA: Diagnosis not present

## 2021-05-07 DIAGNOSIS — Z436 Encounter for attention to other artificial openings of urinary tract: Secondary | ICD-10-CM | POA: Diagnosis not present

## 2021-05-07 DIAGNOSIS — L89891 Pressure ulcer of other site, stage 1: Secondary | ICD-10-CM | POA: Diagnosis present

## 2021-05-07 DIAGNOSIS — R404 Transient alteration of awareness: Secondary | ICD-10-CM | POA: Diagnosis not present

## 2021-05-07 DIAGNOSIS — Z7982 Long term (current) use of aspirin: Secondary | ICD-10-CM

## 2021-05-07 DIAGNOSIS — R000 Tachycardia, unspecified: Secondary | ICD-10-CM | POA: Diagnosis not present

## 2021-05-07 DIAGNOSIS — Z9049 Acquired absence of other specified parts of digestive tract: Secondary | ICD-10-CM

## 2021-05-07 DIAGNOSIS — G8220 Paraplegia, unspecified: Secondary | ICD-10-CM | POA: Diagnosis present

## 2021-05-07 DIAGNOSIS — R509 Fever, unspecified: Secondary | ICD-10-CM | POA: Diagnosis not present

## 2021-05-07 DIAGNOSIS — F419 Anxiety disorder, unspecified: Secondary | ICD-10-CM | POA: Diagnosis present

## 2021-05-07 DIAGNOSIS — R6520 Severe sepsis without septic shock: Secondary | ICD-10-CM | POA: Diagnosis not present

## 2021-05-07 DIAGNOSIS — Z882 Allergy status to sulfonamides status: Secondary | ICD-10-CM

## 2021-05-07 DIAGNOSIS — Z6826 Body mass index (BMI) 26.0-26.9, adult: Secondary | ICD-10-CM

## 2021-05-07 DIAGNOSIS — N390 Urinary tract infection, site not specified: Secondary | ICD-10-CM

## 2021-05-07 DIAGNOSIS — N136 Pyonephrosis: Secondary | ICD-10-CM | POA: Diagnosis not present

## 2021-05-07 DIAGNOSIS — L89222 Pressure ulcer of left hip, stage 2: Secondary | ICD-10-CM | POA: Diagnosis present

## 2021-05-07 DIAGNOSIS — M21372 Foot drop, left foot: Secondary | ICD-10-CM | POA: Diagnosis present

## 2021-05-07 DIAGNOSIS — E162 Hypoglycemia, unspecified: Secondary | ICD-10-CM | POA: Diagnosis present

## 2021-05-07 DIAGNOSIS — Z8551 Personal history of malignant neoplasm of bladder: Secondary | ICD-10-CM

## 2021-05-07 DIAGNOSIS — D631 Anemia in chronic kidney disease: Secondary | ICD-10-CM | POA: Diagnosis present

## 2021-05-07 DIAGNOSIS — E43 Unspecified severe protein-calorie malnutrition: Secondary | ICD-10-CM | POA: Diagnosis present

## 2021-05-07 DIAGNOSIS — Z833 Family history of diabetes mellitus: Secondary | ICD-10-CM

## 2021-05-07 DIAGNOSIS — R627 Adult failure to thrive: Secondary | ICD-10-CM | POA: Diagnosis not present

## 2021-05-07 DIAGNOSIS — M21371 Foot drop, right foot: Secondary | ICD-10-CM | POA: Diagnosis present

## 2021-05-07 DIAGNOSIS — Z87442 Personal history of urinary calculi: Secondary | ICD-10-CM

## 2021-05-07 DIAGNOSIS — L89152 Pressure ulcer of sacral region, stage 2: Secondary | ICD-10-CM | POA: Diagnosis not present

## 2021-05-07 DIAGNOSIS — Z20822 Contact with and (suspected) exposure to covid-19: Secondary | ICD-10-CM | POA: Diagnosis not present

## 2021-05-07 DIAGNOSIS — Z801 Family history of malignant neoplasm of trachea, bronchus and lung: Secondary | ICD-10-CM

## 2021-05-07 DIAGNOSIS — Y846 Urinary catheterization as the cause of abnormal reaction of the patient, or of later complication, without mention of misadventure at the time of the procedure: Secondary | ICD-10-CM | POA: Diagnosis present

## 2021-05-07 DIAGNOSIS — R7881 Bacteremia: Secondary | ICD-10-CM | POA: Diagnosis not present

## 2021-05-07 DIAGNOSIS — R6521 Severe sepsis with septic shock: Secondary | ICD-10-CM | POA: Diagnosis present

## 2021-05-07 DIAGNOSIS — N320 Bladder-neck obstruction: Secondary | ICD-10-CM | POA: Diagnosis present

## 2021-05-07 DIAGNOSIS — N3289 Other specified disorders of bladder: Secondary | ICD-10-CM | POA: Diagnosis not present

## 2021-05-07 DIAGNOSIS — E871 Hypo-osmolality and hyponatremia: Secondary | ICD-10-CM | POA: Diagnosis not present

## 2021-05-07 DIAGNOSIS — Z8349 Family history of other endocrine, nutritional and metabolic diseases: Secondary | ICD-10-CM

## 2021-05-07 DIAGNOSIS — T83091A Other mechanical complication of indwelling urethral catheter, initial encounter: Secondary | ICD-10-CM | POA: Diagnosis present

## 2021-05-07 DIAGNOSIS — Z79899 Other long term (current) drug therapy: Secondary | ICD-10-CM

## 2021-05-07 DIAGNOSIS — T83511A Infection and inflammatory reaction due to indwelling urethral catheter, initial encounter: Secondary | ICD-10-CM | POA: Diagnosis not present

## 2021-05-07 DIAGNOSIS — N99522 Malfunction of other external stoma of urinary tract: Secondary | ICD-10-CM | POA: Diagnosis not present

## 2021-05-07 DIAGNOSIS — Z981 Arthrodesis status: Secondary | ICD-10-CM

## 2021-05-07 LAB — URINALYSIS, ROUTINE W REFLEX MICROSCOPIC
Bilirubin Urine: NEGATIVE
Glucose, UA: NEGATIVE mg/dL
Ketones, ur: NEGATIVE mg/dL
Nitrite: NEGATIVE
Protein, ur: NEGATIVE mg/dL
Specific Gravity, Urine: 1.003 — ABNORMAL LOW (ref 1.005–1.030)
pH: 7 (ref 5.0–8.0)

## 2021-05-07 LAB — PROCALCITONIN: Procalcitonin: 129.2 ng/mL

## 2021-05-07 LAB — TROPONIN I (HIGH SENSITIVITY): Troponin I (High Sensitivity): 12 ng/L (ref <18)

## 2021-05-07 LAB — COMPREHENSIVE METABOLIC PANEL
ALT: 48 U/L — ABNORMAL HIGH (ref 0–44)
AST: 42 U/L — ABNORMAL HIGH (ref 15–41)
Albumin: 2 g/dL — ABNORMAL LOW (ref 3.5–5.0)
Alkaline Phosphatase: 196 U/L — ABNORMAL HIGH (ref 38–126)
Anion gap: 6 (ref 5–15)
BUN: 54 mg/dL — ABNORMAL HIGH (ref 8–23)
CO2: 21 mmol/L — ABNORMAL LOW (ref 22–32)
Calcium: 9.9 mg/dL (ref 8.9–10.3)
Chloride: 103 mmol/L (ref 98–111)
Creatinine, Ser: 2.54 mg/dL — ABNORMAL HIGH (ref 0.61–1.24)
GFR, Estimated: 28 mL/min — ABNORMAL LOW (ref >60)
Glucose, Bld: 122 mg/dL — ABNORMAL HIGH (ref 70–99)
Potassium: 3.4 mmol/L — ABNORMAL LOW (ref 3.5–5.1)
Sodium: 130 mmol/L — ABNORMAL LOW (ref 135–145)
Total Bilirubin: 0.5 mg/dL (ref 0.3–1.2)
Total Protein: 5.6 g/dL — ABNORMAL LOW (ref 6.5–8.1)

## 2021-05-07 LAB — GLUCOSE, CAPILLARY
Glucose-Capillary: 89 mg/dL (ref 70–99)
Glucose-Capillary: 96 mg/dL (ref 70–99)

## 2021-05-07 LAB — LACTIC ACID, PLASMA: Lactic Acid, Venous: 0.9 mmol/L (ref 0.5–1.9)

## 2021-05-07 LAB — CBC WITH DIFFERENTIAL/PLATELET
Abs Immature Granulocytes: 0.07 10*3/uL (ref 0.00–0.07)
Basophils Absolute: 0 10*3/uL (ref 0.0–0.1)
Basophils Relative: 0 %
Eosinophils Absolute: 0.1 10*3/uL (ref 0.0–0.5)
Eosinophils Relative: 1 %
HCT: 23.1 % — ABNORMAL LOW (ref 39.0–52.0)
Hemoglobin: 7 g/dL — ABNORMAL LOW (ref 13.0–17.0)
Immature Granulocytes: 1 %
Lymphocytes Relative: 6 %
Lymphs Abs: 0.6 10*3/uL — ABNORMAL LOW (ref 0.7–4.0)
MCH: 25 pg — ABNORMAL LOW (ref 26.0–34.0)
MCHC: 30.3 g/dL (ref 30.0–36.0)
MCV: 82.5 fL (ref 80.0–100.0)
Monocytes Absolute: 0.8 10*3/uL (ref 0.1–1.0)
Monocytes Relative: 8 %
Neutro Abs: 8.7 10*3/uL — ABNORMAL HIGH (ref 1.7–7.7)
Neutrophils Relative %: 84 %
Platelets: 207 10*3/uL (ref 150–400)
RBC: 2.8 MIL/uL — ABNORMAL LOW (ref 4.22–5.81)
RDW: 18.3 % — ABNORMAL HIGH (ref 11.5–15.5)
WBC: 10.4 10*3/uL (ref 4.0–10.5)
nRBC: 0 % (ref 0.0–0.2)

## 2021-05-07 LAB — TYPE AND SCREEN
ABO/RH(D): A NEG
Antibody Screen: NEGATIVE

## 2021-05-07 LAB — RESP PANEL BY RT-PCR (FLU A&B, COVID) ARPGX2
Influenza A by PCR: NEGATIVE
Influenza B by PCR: NEGATIVE
SARS Coronavirus 2 by RT PCR: NEGATIVE

## 2021-05-07 LAB — PROTIME-INR
INR: 1.3 — ABNORMAL HIGH (ref 0.8–1.2)
Prothrombin Time: 16.2 seconds — ABNORMAL HIGH (ref 11.4–15.2)

## 2021-05-07 LAB — HEMOGLOBIN A1C
Hgb A1c MFr Bld: 5.9 % — ABNORMAL HIGH (ref 4.8–5.6)
Mean Plasma Glucose: 122.63 mg/dL

## 2021-05-07 LAB — APTT: aPTT: 32 seconds (ref 24–36)

## 2021-05-07 LAB — MRSA NEXT GEN BY PCR, NASAL: MRSA by PCR Next Gen: NOT DETECTED

## 2021-05-07 MED ORDER — NOREPINEPHRINE 4 MG/250ML-% IV SOLN
2.0000 ug/min | INTRAVENOUS | Status: DC
  Administered 2021-05-07: 2 ug/min via INTRAVENOUS
  Administered 2021-05-08 – 2021-05-09 (×2): 6 ug/min via INTRAVENOUS
  Filled 2021-05-07 (×2): qty 250

## 2021-05-07 MED ORDER — DOCUSATE SODIUM 100 MG PO CAPS: 100.0000 mg | ORAL_CAPSULE | Freq: Two times a day (BID) | ORAL | Status: DC | PRN

## 2021-05-07 MED ORDER — NOREPINEPHRINE 4 MG/250ML-% IV SOLN
0.0000 ug/min | INTRAVENOUS | Status: DC
  Administered 2021-05-07: 2 ug/min via INTRAVENOUS
  Filled 2021-05-07: qty 250

## 2021-05-07 MED ORDER — LACTATED RINGERS IV BOLUS (SEPSIS)
1000.0000 mL | Freq: Once | INTRAVENOUS | Status: AC
  Administered 2021-05-07: 1000 mL via INTRAVENOUS

## 2021-05-07 MED ORDER — SODIUM CHLORIDE 0.9 % IV SOLN
250.0000 mL | INTRAVENOUS | Status: DC
  Administered 2021-05-08 – 2021-05-11 (×2): 250 mL via INTRAVENOUS

## 2021-05-07 MED ORDER — LACTATED RINGERS IV BOLUS (SEPSIS)
1000.0000 mL | Freq: Once | INTRAVENOUS | Status: AC
Start: 2021-05-07 — End: 2021-05-07
  Administered 2021-05-07: 1000 mL via INTRAVENOUS

## 2021-05-07 MED ORDER — PANTOPRAZOLE SODIUM 40 MG IV SOLR
40.0000 mg | Freq: Every day | INTRAVENOUS | Status: DC
  Administered 2021-05-07 – 2021-05-09 (×3): 40 mg via INTRAVENOUS
  Filled 2021-05-07 (×3): qty 40

## 2021-05-07 MED ORDER — CHLORHEXIDINE GLUCONATE CLOTH 2 % EX PADS
6.0000 | MEDICATED_PAD | Freq: Every day | CUTANEOUS | Status: DC
  Administered 2021-05-08 – 2021-05-12 (×5): 6 via TOPICAL

## 2021-05-07 MED ORDER — INSULIN ASPART 100 UNIT/ML IJ SOLN
0.0000 [IU] | INTRAMUSCULAR | Status: DC
  Administered 2021-05-08: 3 [IU] via SUBCUTANEOUS
  Administered 2021-05-09: 2 [IU] via SUBCUTANEOUS
  Administered 2021-05-09: 1 [IU] via SUBCUTANEOUS
  Administered 2021-05-09: 2 [IU] via SUBCUTANEOUS
  Administered 2021-05-09: 3 [IU] via SUBCUTANEOUS
  Filled 2021-05-07: qty 0.09

## 2021-05-07 MED ORDER — SODIUM CHLORIDE 0.9 % IV SOLN: 2.0000 g | INTRAVENOUS | Status: DC

## 2021-05-07 MED ORDER — SODIUM CHLORIDE 0.9 % IV SOLN
2.0000 g | Freq: Once | INTRAVENOUS | Status: AC
  Administered 2021-05-07: 2 g via INTRAVENOUS
  Filled 2021-05-07: qty 2

## 2021-05-07 MED ORDER — LACTATED RINGERS IV SOLN: INTRAVENOUS | Status: DC

## 2021-05-07 MED ORDER — POLYETHYLENE GLYCOL 3350 17 G PO PACK: 17.0000 g | PACK | Freq: Every day | ORAL | Status: DC | PRN

## 2021-05-07 NOTE — Progress Notes (Signed)
eLink Physician-Brief Progress Note Patient Name: Thomas Schroeder DOB: 1961/02/05 MRN: [id number]   Date of Service  05/07/2021  HPI/Events of Note  Brief HPI: 60 yr old male with hx of Paraplegia, chronic indwelling foley, recent AKI 7/22 s/p rt sided neohrostomy tube placement now admitted for septic shock-  Notes, labs reviewed.   Data: Reviewed. K 3.4, Hg 7, LA 0.9 Sod 130, cr up from 1.5 to 2.54 Trop 12, pro cal 129 Wbc 10.4 Flu/covid neg, CxR neg UA LE +, rare bacteria, wbc + CT renal: IMPRESSION: 1. Stable appearance of right nephrostomy catheter, with no hydronephrosis. 2. Extensive clot distends the lumen of the urinary bladder. 3. Stable necrotic left external iliac adenopathy. 4. Stable left renal atrophy and hydroureteronephrosis.  Camera: Just rolled in. On room air. Scd on. Following commands. MAP 67, on Levophed gtt Sinus tachy at 102, 36.8 temp.on rom air, sats 97%.  A/P:  Septic shock. GU.AKI on CKD. S/p stable nephrostomy tube w/o hydronephrosis or abscess. 2. Anemia of chronic disease. Status post TURBT 04/05/2021 with diagnosis of high-grade T2 urothelial carcinoma 90% squamous differentiation -with lymph node metastasis.  Hypermetabolic necrotic metastatic LEFT operator node.  On PET scan 05/03/2021   .  Seen by Dr. Jonnie Finner at [facility] 04/25/2021.    Plan  = Chemo plan cisplatin when/if renal function improves.  Otherwise surgery. -Await results of CT scan 05/07/2021   3. Paraplegia/muscle spasticity. Wound care if has decube wounds.   eICU Interventions  - s/p fluids, abx. Follow cultures and de escalate antibiotics accordingly  -  on PPI - on SSI: goals < 180 - asp precautions - Urology consultation pending - wean Levophed gtt. Keep MAP > 65 - follow troponin On SCD as VTE.  - watch for any bleeding. - nutrition/diet care.        Intervention Category Major Interventions: Sepsis - evaluation and management;Hypotension - evaluation  and management Evaluation Type: New Patient Evaluation  [name] 05/07/2021, 7:39 PM

## 2021-05-07 NOTE — ED Notes (Signed)
Pt. Refused changing into a gown.

## 2021-05-07 NOTE — ED Notes (Signed)
ICU made aware patient's family member requesting air mattress.  They will call ER when in place.

## 2021-05-07 NOTE — ED Notes (Signed)
Patient transported to CT 

## 2021-05-07 NOTE — Progress Notes (Signed)
Pharmacy Antibiotic Note  Thomas Schroeder is a 60 y.o. male with hx bladder cancer, kidney stones, neurogenic bladder, right hydronephrosis (nephrostomy tube) and chronic foley presented to the ED on 05/07/2021 with c/o generalized weakness and hematuria.  Pharmacy has been consulted to dose cefepime for sepsis/UTI.    - scr 2.54 (crcl~27)  Plan: - cefepime 2gm IV q24h - monitor renal function closely  ____________________________________________  Height: '5\' 6"'$  (167.6 cm) Weight: 75 kg (165 lb 5.5 oz) IBW/kg (Calculated) : 63.8  Temp (24hrs), Avg:98 F (36.7 C), Min:97.8 F (36.6 C), Max:98.2 F (36.8 C)  Recent Labs  Lab 05/07/21 1038  WBC 10.4  CREATININE 2.54*  LATICACIDVEN 0.9    Estimated Creatinine Clearance: 27.6 mL/min (A) (by C-G formula based on SCr of 2.54 mg/dL (H)).    Allergies  Allergen Reactions   Bactrim Rash    Skin sloughing    Ropinirole Other (See Comments)    Increased leg spasms/poor sleep.    Latex Rash     Thank you for allowing pharmacy to be a part of this patient's care.  Lynelle Doctor 05/07/2021 5:48 PM

## 2021-05-07 NOTE — ED Triage Notes (Addendum)
Pt BIB [facility]. Patient c/o body weakness beginning 3 days ago. Patient has blood in his catheter beginning 3 days ago. Patients Urologist is made aware. '1000mg'$  of tynelol 900 ml of NS given by EMS. 18g left AC.  Patient has right nephrostomy tube that is draining cloudy yellow urine and urinary catheter that is draining bloody urine with lots of mucous looking sediment.    101.0- Temp  78/38- BP 100/50 80- HR 20-RR 99% on RA

## 2021-05-07 NOTE — ED Notes (Signed)
First lactic acid was 0.9, per Wells Guiles, [place] can d/c second lactic acid.
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
## 2021-05-07 NOTE — ED Notes (Signed)
   05/07/21 0947  Vitals  BP (!) 79/43  MAP (mmHg) (!) 55  BP Location Right Arm  BP Method Automatic  Patient Position (if appropriate) Lying  Pulse Rate 75  ECG Heart Rate 77  Resp (!) 21  MEWS COLOR  MEWS Score Color Yellow  Oxygen Therapy  SpO2 96 %  MEWS Score  MEWS Temp 0  MEWS Systolic 2  MEWS Pulse 0  MEWS RR 1  MEWS LOC 0  MEWS Score 3  Rebecca, PA made aware of patient's current BP.
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
## 2021-05-07 NOTE — Progress Notes (Signed)
A consult was received from an ED physician for Cefepime per pharmacy dosing.  The patient's profile has been reviewed for ht/wt/allergies/indication/available labs.    A one time order has been placed for Cefepime 2gm x1.  Further antibiotics/pharmacy consults should be ordered by admitting physician if indicated.                       Thank you,  Minda Ditto PharmD 05/07/2021  10:11 AM

## 2021-05-07 NOTE — Sepsis Progress Note (Signed)
eLink monitoring code sepsis.  

## 2021-05-08 ENCOUNTER — Ambulatory Visit (HOSPITAL_COMMUNITY): Payer: BC Managed Care – PPO | Admitting: Hematology

## 2021-05-08 ENCOUNTER — Encounter (HOSPITAL_COMMUNITY): Payer: Self-pay

## 2021-05-08 LAB — BLOOD CULTURE ID PANEL (REFLEXED) - BCID2

## 2021-05-08 LAB — GLUCOSE, CAPILLARY
Glucose-Capillary: 75 mg/dL (ref 70–99)
Glucose-Capillary: 71 mg/dL (ref 70–99)
Glucose-Capillary: 119 mg/dL — ABNORMAL HIGH (ref 70–99)
Glucose-Capillary: 155 mg/dL — ABNORMAL HIGH (ref 70–99)
Glucose-Capillary: 235 mg/dL — ABNORMAL HIGH (ref 70–99)

## 2021-05-08 LAB — URINE CULTURE

## 2021-05-08 LAB — COMPREHENSIVE METABOLIC PANEL
ALT: 104 U/L — ABNORMAL HIGH (ref 0–44)
AST: 162 U/L — ABNORMAL HIGH (ref 15–41)
Albumin: 2.1 g/dL — ABNORMAL LOW (ref 3.5–5.0)
Alkaline Phosphatase: 531 U/L — ABNORMAL HIGH (ref 38–126)
Anion gap: 10 (ref 5–15)
BUN: 30 mg/dL — ABNORMAL HIGH (ref 8–23)
CO2: 16 mmol/L — ABNORMAL LOW (ref 22–32)
Calcium: 10.1 mg/dL (ref 8.9–10.3)
Chloride: 107 mmol/L (ref 98–111)
Creatinine, Ser: 1.93 mg/dL — ABNORMAL HIGH (ref 0.61–1.24)
GFR, Estimated: 39 mL/min — ABNORMAL LOW (ref >60)
Glucose, Bld: 235 mg/dL — ABNORMAL HIGH (ref 70–99)
Potassium: 3.8 mmol/L (ref 3.5–5.1)
Sodium: 133 mmol/L — ABNORMAL LOW (ref 135–145)
Total Bilirubin: 0.6 mg/dL (ref 0.3–1.2)
Total Protein: 5.6 g/dL — ABNORMAL LOW (ref 6.5–8.1)

## 2021-05-08 LAB — CBC
HCT: 24.5 % — ABNORMAL LOW (ref 39.0–52.0)
HCT: 24.9 % — ABNORMAL LOW (ref 39.0–52.0)
Hemoglobin: 7.1 g/dL — ABNORMAL LOW (ref 13.0–17.0)
Hemoglobin: 7.2 g/dL — ABNORMAL LOW (ref 13.0–17.0)
MCH: 23.9 pg — ABNORMAL LOW (ref 26.0–34.0)
MCH: 24.5 pg — ABNORMAL LOW (ref 26.0–34.0)
MCHC: 29 g/dL — ABNORMAL LOW (ref 30.0–36.0)
MCHC: 28.9 g/dL — ABNORMAL LOW (ref 30.0–36.0)
MCV: 82.5 fL (ref 80.0–100.0)
MCV: 84.7 fL (ref 80.0–100.0)
Platelets: 237 10*3/uL (ref 150–400)
Platelets: 233 10*3/uL (ref 150–400)
RBC: 2.97 MIL/uL — ABNORMAL LOW (ref 4.22–5.81)
RBC: 2.94 MIL/uL — ABNORMAL LOW (ref 4.22–5.81)
RDW: 18.5 % — ABNORMAL HIGH (ref 11.5–15.5)
RDW: 18.6 % — ABNORMAL HIGH (ref 11.5–15.5)
WBC: 8.1 10*3/uL (ref 4.0–10.5)
WBC: 9.9 10*3/uL (ref 4.0–10.5)
nRBC: 0 % (ref 0.0–0.2)
nRBC: 0 % (ref 0.0–0.2)

## 2021-05-08 LAB — MAGNESIUM
Magnesium: 2.1 mg/dL (ref 1.7–2.4)
Magnesium: 1.7 mg/dL (ref 1.7–2.4)

## 2021-05-08 LAB — BASIC METABOLIC PANEL
Anion gap: 6 (ref 5–15)
BUN: 46 mg/dL — ABNORMAL HIGH (ref 8–23)
CO2: 22 mmol/L (ref 22–32)
Calcium: 10.2 mg/dL (ref 8.9–10.3)
Chloride: 108 mmol/L (ref 98–111)
Creatinine, Ser: 2.24 mg/dL — ABNORMAL HIGH (ref 0.61–1.24)
GFR, Estimated: 33 mL/min — ABNORMAL LOW (ref >60)
Glucose, Bld: 87 mg/dL (ref 70–99)
Potassium: 4.1 mmol/L (ref 3.5–5.1)
Sodium: 136 mmol/L (ref 135–145)

## 2021-05-08 LAB — PROTIME-INR
INR: 1.2 (ref 0.8–1.2)
INR: 1.3 — ABNORMAL HIGH (ref 0.8–1.2)
Prothrombin Time: 15.3 seconds — ABNORMAL HIGH (ref 11.4–15.2)
Prothrombin Time: 15.8 seconds — ABNORMAL HIGH (ref 11.4–15.2)

## 2021-05-08 LAB — TROPONIN I (HIGH SENSITIVITY): Troponin I (High Sensitivity): 17 ng/L (ref <18)

## 2021-05-08 LAB — HIV ANTIBODY (ROUTINE TESTING W REFLEX): HIV Screen 4th Generation wRfx: NONREACTIVE

## 2021-05-08 LAB — BLOOD GAS, VENOUS
Acid-base deficit: 6.9 mmol/L — ABNORMAL HIGH (ref 0.0–2.0)
Bicarbonate: 17.3 mmol/L — ABNORMAL LOW (ref 20.0–28.0)
O2 Saturation: 72.1 %
Patient temperature: 37
pCO2, Ven: 30.9 mmHg — ABNORMAL LOW (ref 44.0–60.0)
pH, Ven: 7.366 (ref 7.250–7.430)
pO2, Ven: 42.4 mmHg (ref 32.0–45.0)

## 2021-05-08 LAB — LACTIC ACID, PLASMA: Lactic Acid, Venous: 4.2 mmol/L (ref 0.5–1.9)

## 2021-05-08 LAB — PHOSPHORUS
Phosphorus: 3.5 mg/dL (ref 2.5–4.6)
Phosphorus: 1.5 mg/dL — ABNORMAL LOW (ref 2.5–4.6)

## 2021-05-08 LAB — PROCALCITONIN: Procalcitonin: 49.21 ng/mL

## 2021-05-08 MED ORDER — MAGNESIUM SULFATE 2 GM/50ML IV SOLN
2.0000 g | Freq: Once | INTRAVENOUS | Status: AC
  Administered 2021-05-08: 2 g via INTRAVENOUS
  Filled 2021-05-08: qty 50

## 2021-05-08 MED ORDER — DEXTROSE 50 % IV SOLN
INTRAVENOUS | Status: AC
  Filled 2021-05-08: qty 50

## 2021-05-08 MED ORDER — ACETAMINOPHEN 325 MG PO TABS
650.0000 mg | ORAL_TABLET | Freq: Four times a day (QID) | ORAL | Status: DC | PRN
  Administered 2021-05-08 – 2021-05-09 (×2): 650 mg via ORAL
  Filled 2021-05-08 (×2): qty 2

## 2021-05-08 MED ORDER — LACTATED RINGERS IV BOLUS
1000.0000 mL | Freq: Once | INTRAVENOUS | Status: AC
  Administered 2021-05-08: 1000 mL via INTRAVENOUS

## 2021-05-08 MED ORDER — HEPARIN SODIUM (PORCINE) 5000 UNIT/ML IJ SOLN
5000.0000 [IU] | Freq: Three times a day (TID) | INTRAMUSCULAR | Status: DC
  Administered 2021-05-08 – 2021-05-15 (×21): 5000 [IU] via SUBCUTANEOUS
  Filled 2021-05-08 (×22): qty 1

## 2021-05-08 MED ORDER — DEXTROSE 50 % IV SOLN
50.0000 mL | Freq: Once | INTRAVENOUS | Status: AC
  Administered 2021-05-08: 50 mL via INTRAVENOUS

## 2021-05-08 MED ORDER — DEXTROSE IN LACTATED RINGERS 5 % IV SOLN: INTRAVENOUS | Status: DC

## 2021-05-08 MED ORDER — POTASSIUM PHOSPHATES 15 MMOLE/5ML IV SOLN
45.0000 mmol | Freq: Once | INTRAVENOUS | Status: AC
  Administered 2021-05-08: 45 mmol via INTRAVENOUS
  Filled 2021-05-08: qty 15

## 2021-05-08 MED ORDER — LACTATED RINGERS IV BOLUS
500.0000 mL | Freq: Once | INTRAVENOUS | Status: AC
  Administered 2021-05-08: 500 mL via INTRAVENOUS

## 2021-05-08 MED ORDER — VANCOMYCIN HCL 1500 MG/300ML IV SOLN
1500.0000 mg | Freq: Once | INTRAVENOUS | Status: AC
  Administered 2021-05-08: 1500 mg via INTRAVENOUS
  Filled 2021-05-08: qty 300

## 2021-05-08 MED ORDER — VANCOMYCIN HCL 750 MG/150ML IV SOLN: 750.0000 mg | INTRAVENOUS | Status: DC

## 2021-05-08 MED ORDER — SODIUM CHLORIDE 0.9 % IV SOLN
2.0000 g | Freq: Two times a day (BID) | INTRAVENOUS | Status: AC
  Administered 2021-05-08 – 2021-05-15 (×15): 2 g via INTRAVENOUS
  Filled 2021-05-08 (×15): qty 2

## 2021-05-08 NOTE — Progress Notes (Signed)
Urine output in foley has decreased over the last few hours, output has a lot of thick purulent material and blood clots coming out. Dr. Claudia Desanctis (Urology) notified. Dr. Claudia Desanctis states that I can apply traction to foley and/or flush foley with 41m sterile water as needed. Will continue to monitor.

## 2021-05-08 NOTE — Progress Notes (Addendum)
Pharmacy Antibiotic Note  Thomas Schroeder is a 60 y.o. male with hx bladder cancer, kidney stones, neurogenic bladder, right hydronephrosis (nephrostomy tube) and chronic foley presented to the ED on 05/07/2021 with c/o generalized weakness and hematuria.  Pharmacy has been consulted to dose cefepime for sepsis/UTI.   05/08/21  Blood culture now growing GPC in clusters in 1/4 bottles with BCID detecting Staph species. Pharmacy consulted to initiate vancomycin  Plan: Vancomycin 1500 mg iv once followed by 750 mg IV Q 24 hrs. Goal AUC 400-550. Expected AUC: 437 SCr used: 2.24  Continue cefepime 2 g iv q 12 hours  Will f/u renal function, culture results, and clinical course Levels if/when indicated  Height: '5\' 6"'$  (167.6 cm) Weight: 78.5 kg (173 lb) IBW/kg (Calculated) : 63.8  Temp (24hrs), Avg:98.7 F (37.1 C), Min:98.2 F (36.8 C), Max:100 F (37.8 C)  Recent Labs  Lab 05/07/21 1038 05/08/21 0244  WBC 10.4 8.1  CREATININE 2.54* 2.24*  LATICACIDVEN 0.9  --     Estimated Creatinine Clearance: 34.1 mL/min (A) (by C-G formula based on SCr of 2.24 mg/dL (H)).    Allergies  Allergen Reactions   Bactrim Rash    Skin sloughing    Ropinirole Other (See Comments)    Increased leg spasms/poor sleep.    Latex Rash    Antimicrobials this admission: 8/22 cefepime>> 8/23 vanc >>  Dose adjustments this admission:  Microbiology results: Prev Cx 7/22 UCx R nephrostomy: Kleb pneumo, res Amp 1/31 UCx: E faecalis, pan-sens   8/22 bcx x2: GPC in clusters in 1/4; BCID w/ Staph species 8/22 ucx: multiple species    Thank you for allowing pharmacy to be a part of this patient's care.  Napoleon Form 05/08/2021 11:36 AM  PHARMACY - PHYSICIAN COMMUNICATION CRITICAL VALUE ALERT - BLOOD CULTURE IDENTIFICATION (BCID)  Thomas Schroeder is an 60 y.o. male who presented to [facility] on 05/07/2021 with a chief complaint of sepsis  Assessment:  GPC in 1/4 bottles w/ Staph species per  BCID  Name of physician (or Provider) Contacted: Dr. Chase Caller  Current antibiotics: cefepime  Changes to prescribed antibiotics:  CCM wants to initiate vancomycin  Results for orders placed or performed during the hospital encounter of 05/07/21  Blood Culture ID Panel (Reflexed) (Collected: 05/07/2021 10:02 AM)  Result Value Ref Range   Enterococcus faecalis NOT DETECTED NOT DETECTED   Enterococcus Faecium NOT DETECTED NOT DETECTED   Listeria monocytogenes NOT DETECTED NOT DETECTED   Staphylococcus species DETECTED (A) NOT DETECTED   Staphylococcus aureus (BCID) NOT DETECTED NOT DETECTED   Staphylococcus epidermidis NOT DETECTED NOT DETECTED   Staphylococcus lugdunensis NOT DETECTED NOT DETECTED   Streptococcus species NOT DETECTED NOT DETECTED   Streptococcus agalactiae NOT DETECTED NOT DETECTED   Streptococcus pneumoniae NOT DETECTED NOT DETECTED   Streptococcus pyogenes NOT DETECTED NOT DETECTED   A.calcoaceticus-baumannii NOT DETECTED NOT DETECTED   Bacteroides fragilis NOT DETECTED NOT DETECTED   Enterobacterales NOT DETECTED NOT DETECTED   Enterobacter cloacae complex NOT DETECTED NOT DETECTED   Escherichia coli NOT DETECTED NOT DETECTED   Klebsiella aerogenes NOT DETECTED NOT DETECTED   Klebsiella oxytoca NOT DETECTED NOT DETECTED   Klebsiella pneumoniae NOT DETECTED NOT DETECTED   Proteus species NOT DETECTED NOT DETECTED   Salmonella species NOT DETECTED NOT DETECTED   Serratia marcescens NOT DETECTED NOT DETECTED   Haemophilus influenzae NOT DETECTED NOT DETECTED   Neisseria meningitidis NOT DETECTED NOT DETECTED   Pseudomonas aeruginosa NOT DETECTED NOT  DETECTED   Stenotrophomonas maltophilia NOT DETECTED NOT DETECTED   Candida albicans NOT DETECTED NOT DETECTED   Candida auris NOT DETECTED NOT DETECTED   Candida glabrata NOT DETECTED NOT DETECTED   Candida krusei NOT DETECTED NOT DETECTED   Candida parapsilosis NOT DETECTED NOT DETECTED   Candida tropicalis NOT  DETECTED NOT DETECTED   Cryptococcus neoformans/gattii NOT DETECTED NOT DETECTED    Napoleon Form 05/08/2021  11:42 AM

## 2021-05-08 NOTE — Progress Notes (Signed)
Urology Inpatient Progress Report         Intv/Subj: Patient admitted to ICU for sepsis.  He remains on pressors.  PCN tube continues to drain well.  Foley catheter with minimal drainage.  Urology was called overnight due to change in Foley output.  Discussed that this is appropriate as most of urine is draining through her nephrostomy tube.  Active Problems:   Shock circulatory ([place])  Current Facility-Administered Medications  Medication Dose Route Frequency Provider Last Rate Last Admin   0.9 %  sodium chloride infusion  250 mL Intravenous Continuous Chase Caller, Murali, MD       ceFEPIme (MAXIPIME) 2 g in sodium chloride 0.9 % 100 mL IVPB  2 g Intravenous Q24H Pham, Anh P, RPH       Chlorhexidine Gluconate Cloth 2 % PADS 6 each  6 each Topical Daily Ramaswamy, Murali, MD       docusate sodium (COLACE) capsule 100 mg  100 mg Oral BID PRN Brand Males, MD       insulin aspart (novoLOG) injection 0-9 Units  0-9 Units Subcutaneous Q4H Ramaswamy, Murali, MD       norepinephrine (LEVOPHED) '4mg'$  in 222m premix infusion  2-10 mcg/min Intravenous Titrated RBrand Males MD   Stopped at 05/08/21 0255   pantoprazole (PROTONIX) injection 40 mg  40 mg Intravenous QHS RBrand Males MD   40 mg at 05/07/21 2300   polyethylene glycol (MIRALAX / GLYCOLAX) packet 17 g  17 g Oral Daily PRN RBrand Males MD         Objective: Vital: Vitals:   05/08/21 0400 05/08/21 0500 05/08/21 0630 05/08/21 0700  BP: (!) 123/48  (!) 140/42 (!) 126/38  Pulse: 80  97 79  Resp: 18  (!) 21 (!) 22  Temp: 98.3 F (36.8 C)     TempSrc: Oral     SpO2: 99%  97% 98%  Weight:  78.5 kg    Height:       I/Os: I/O last 3 completed shifts: In: 2215.2 [I.V.:2095.2; Other:120] Out: 2940 [Urine:2940]  Physical Exam:  General: Patient is in no apparent distress; no longer shaking Lungs: Normal respiratory effort, chest expands symmetrically. GI: IThe abdomen is soft and nontender without mass. RIGHT  PCN tube with clear yellow urine in bag Foley: tea/light red urine, minimal output as expected   Ext: lower extremities symmetric  Lab Results: Recent Labs    05/07/21 1038 05/08/21 0244  WBC 10.4 8.1  HGB 7.0* 7.1*  HCT 23.1* 24.5*   Recent Labs    05/07/21 1038 05/08/21 0244  NA 130* 136  K 3.4* 4.1  CL 103 108  CO2 21* 22  GLUCOSE 122* 87  BUN 54* 46*  CREATININE 2.54* 2.24*  CALCIUM 9.9 10.2   Recent Labs    05/07/21 1038  INR 1.3*   No results for input(s): LABURIN in the last 72 hours. Results for orders placed or performed during the hospital encounter of 05/07/21  Resp Panel by RT-PCR (Flu A&B, Covid) Nasopharyngeal Swab     Status: None   Collection Time: 05/07/21 11:07 AM   Specimen: Nasopharyngeal Swab; Nasopharyngeal(NP) swabs in vial transport medium  Result Value Ref Range Status   SARS Coronavirus 2 by RT PCR NEGATIVE NEGATIVE Final    Comment: (NOTE) SARS-CoV-2 target nucleic acids are NOT DETECTED.  The SARS-CoV-2 RNA is generally detectable in upper respiratory specimens during the acute phase of infection. The lowest concentration of SARS-CoV-2 viral copies this assay can  detect is 138 copies/mL. A negative result does not preclude SARS-Cov-2 infection and should not be used as the sole basis for treatment or other patient management decisions. A negative result may occur with  improper specimen collection/handling, submission of specimen other than nasopharyngeal swab, presence of viral mutation(s) within the areas targeted by this assay, and inadequate number of viral copies(<138 copies/mL). A negative result must be combined with clinical observations, patient history, and epidemiological information. The expected result is Negative.  Fact Sheet for Patients:  EntrepreneurPulse.com.au  Fact Sheet for Healthcare Providers:  IncredibleEmployment.be  This test is no t yet approved or cleared by the  [place] FDA and  has been authorized for detection and/or diagnosis of SARS-CoV-2 by FDA under an Emergency Use Authorization (EUA). This EUA will remain  in effect (meaning this test can be used) for the duration of the COVID-19 declaration under Section 564(b)(1) of the Act, 21 U.S.C.section 360bbb-3(b)(1), unless the authorization is terminated  or revoked sooner.       Influenza A by PCR NEGATIVE NEGATIVE Final   Influenza B by PCR NEGATIVE NEGATIVE Final    Comment: (NOTE) The Xpert Xpress SARS-CoV-2/FLU/RSV plus assay is intended as an aid in the diagnosis of influenza from Nasopharyngeal swab specimens and should not be used as a sole basis for treatment. Nasal washings and aspirates are unacceptable for Xpert Xpress SARS-CoV-2/FLU/RSV testing.  Fact Sheet for Patients: EntrepreneurPulse.com.au  Fact Sheet for Healthcare Providers: IncredibleEmployment.be  This test is not yet approved or cleared by the [place] FDA and has been authorized for detection and/or diagnosis of SARS-CoV-2 by FDA under an Emergency Use Authorization (EUA). This EUA will remain in effect (meaning this test can be used) for the duration of the COVID-19 declaration under Section 564(b)(1) of the Act, 21 U.S.C. section 360bbb-3(b)(1), unless the authorization is terminated or revoked.  Performed at [facility], [place] [address] [place], [place] [address]   MRSA Next Gen by PCR, Nasal     Status: None   Collection Time: 05/07/21  8:40 PM   Specimen: Nasal Mucosa; Nasal Swab  Result Value Ref Range Status   MRSA by PCR Next Gen NOT DETECTED NOT DETECTED Final    Comment: (NOTE) The GeneXpert MRSA Assay (FDA approved for NASAL specimens only), is one component of a comprehensive MRSA colonization surveillance program. It is not intended to diagnose MRSA infection nor to guide or monitor treatment for MRSA infections. Test  performance is not FDA approved in patients less than 29 years old. Performed at [facility], [place] [address] [place], [place] [address]     Studies/Results: DG Chest Port 1 View  Result Date: 05/07/2021 CLINICAL DATA:  Sepsis. EXAM: PORTABLE CHEST 1 VIEW COMPARISON:  October 04, 2020. FINDINGS: The heart size and mediastinal contours are within normal limits. Both lungs are clear. The visualized skeletal structures are unremarkable. IMPRESSION: No active disease. Electronically Signed   By: Marijo Conception M.D.   On: 05/07/2021 11:19   CT Renal Stone Study  Result Date: 05/07/2021 CLINICAL DATA:  Hematuria, right nephrostomy tube, bladder carcinoma, sepsis EXAM: CT ABDOMEN AND PELVIS WITHOUT CONTRAST TECHNIQUE: Multidetector CT imaging of the abdomen and pelvis was performed following the standard protocol without IV contrast. COMPARISON:  PET-CT 05/03/2021 and previous FINDINGS: Lower chest: No pleural or pericardial effusion. Dependent atelectasis in the lung bases right greater than left, increased since previous. Hepatobiliary: Stable benign segment 4 hepatic cyst. No calcified gallstones.  Pancreas: Unremarkable. No pancreatic ductal dilatation or surrounding inflammatory changes. Spleen: Normal in size without focal abnormality. Adrenals/Urinary Tract: Adrenal glands unremarkable. Chronic left renal parenchymal atrophy with hydronephrosis and ureterectasis to the ureteral orifice. Stable right percutaneous nephrostomy catheter. No pneumothorax. 4 and 1 mm calcifications in the lower pole right renal collecting system. Urinary bladder is distended. Scattered gas bubbles and high attenuation material in the lumen consistent with clot. Stomach/Bowel: Stomach is nondistended. Small bowel decompressed. The colon is nondilated, unremarkable. Vascular/Lymphatic: Bulky left external iliac adenopathy as before. Subcentimeter left para-aortic and aortocaval lymph nodes stable. Aortic  calcifications without aneurysm. Reproductive: Prostate is unremarkable. Other: No ascites.  No free air. Musculoskeletal: Stable L5 compression deformity. Orthopedic fixation hardware across proximal femoral fracture. IM rod in the right femur partially visualized. IMPRESSION: 1. Stable appearance of right nephrostomy catheter, with no hydronephrosis. 2. Extensive clot distends the lumen of the urinary bladder. 3. Stable necrotic left external iliac adenopathy. 4. Stable left renal atrophy and hydroureteronephrosis. Electronically Signed   By: Lucrezia Europe M.D.   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  1.  Continue right PCN tube to gravity drainage 2.  Continue Foley catheter to gravity drainage however there may not be significant output due to nephrostomy tube in place 3.  Follow-up urine and blood culture results 4.  Outpatient follow-up with Dr. Stacy Gardner, MD Urology 05/08/2021, 7:27 AM

## 2021-05-08 NOTE — Progress Notes (Signed)
PHARMACY NOTE:  ANTIMICROBIAL RENAL DOSAGE ADJUSTMENT  Current antimicrobial regimen includes a mismatch between antimicrobial dosage and estimated renal function.  As per policy approved by the Pharmacy & Therapeutics and Medical Executive Committees, the antimicrobial dosage will be adjusted accordingly.  Current antimicrobial dosage:  cefepime 2 g q 24 h  Indication: complicated uti w/ sepsis  Renal Function:  Estimated Creatinine Clearance: 34.1 mL/min (A) (by C-G formula based on SCr of 2.24 mg/dL (H)). '[]'$      On intermittent HD, scheduled: '[]'$      On CRRT    Antimicrobial dosage has been changed to:  cefepime 2 g q 12 h  Additional comments: patient w/ paraplegia so CrCl may overestimate GFR, however good UOP and SCr down-trending w/ serious infection   Thank you for allowing pharmacy to be a part of this patient's care.  Napoleon Form, [facility] 05/08/2021 7:46 AM

## 2021-05-08 NOTE — Consult Note (Signed)
[facility] Nurse Consult Note: Reason for Consult: Stage 2 PI to sacrum, POA.  Stage 1 PIs noted to left lateral knee and left lateral hip Wound type:pressure Pressure Injury POA: Yes Measurement:Per Nursing Flow Sheet Wound bed: Sacrum is pink, moist with scant serous exudate Drainage (amount, consistency, odor) As noted above Periwound:intact, dry Dressing procedure/placement/frequency:Guidance is provided for floatation of heels, topical care to sacrum using a xeroform gauze with silicone foam topper and silicone foams to the left lateral knee and hip. This guidance is consistent with our Skin Care Order Set.  [facility] nursing team will not follow, but will remain available to this patient, the nursing and medical teams.  Please re-consult if needed. Thanks, Maudie Flakes, MSN, RN, [place], Arther Abbott  Pager# [phone]

## 2021-05-08 NOTE — Progress Notes (Signed)
eLink Physician-Brief Progress Note Patient Name: Thomas Schroeder DOB: [birth year] MRN: [id number]   Date of Service  05/08/2021  HPI/Events of Note  Urology placed foley  eICU Interventions  Foley insert ordered.     Intervention Category Minor Interventions: Other:  [name] 05/08/2021, 1:09 AM

## 2021-05-08 NOTE — Progress Notes (Signed)
Call from bedside nurse that patient is spiking fever.  No update on cultures so far.  He did get fluid bolus and his Levophed need continues to go up.  Plan  -Repeat 1 L fluid bolus - Check VBG - Check CBC, chemistry, liver function test, lactic acid, procalcitonin, magnesium and phosphorus -Check chest x-ray tomorrow -If pressor needs continue to go up low threshold to add GIaprezza/vasopressin) a central line    Electronic ICU to follow-up on labs tonight and camera care SIGNATURE    Dr. Brand Males, M.D., F.C.C.P,  Pulmonary and Critical Care Medicine Staff Physician, [place] Director - Interstitial Lung Disease  Program  Pulmonary [place] [facility] [place], [place], [address]  NPI Number:  NPI [id number]  Pager: [phone], If no answer  -> Check AMION or Try [phone] Telephone (clinical office): [phone] Telephone (research): [phone]  6:19 PM 05/08/2021
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
## 2021-05-08 NOTE — Progress Notes (Signed)
eLink Physician-Brief Progress Note Patient Name: Thomas Schroeder DOB: [birth year] MRN: [id number]   Date of Service  05/08/2021  HPI/Events of Note  Lactic acid 4.2, Phos 1.5, Mg++ 1.7. K+ 3.8.  eICU Interventions  LR 500 ml iv bolus x 1, electrolytes replaced per E-Link electrolyte protocol, repeat labs ordered for 3:00 AM.        Kerry Kass [name] 05/08/2021, 9:17 PM

## 2021-05-09 ENCOUNTER — Inpatient Hospital Stay (HOSPITAL_COMMUNITY): Payer: BC Managed Care – PPO

## 2021-05-09 DIAGNOSIS — N179 Acute kidney failure, unspecified: Secondary | ICD-10-CM | POA: Diagnosis not present

## 2021-05-09 DIAGNOSIS — R579 Shock, unspecified: Secondary | ICD-10-CM | POA: Diagnosis not present

## 2021-05-09 DIAGNOSIS — R6520 Severe sepsis without septic shock: Secondary | ICD-10-CM

## 2021-05-09 DIAGNOSIS — A419 Sepsis, unspecified organism: Secondary | ICD-10-CM

## 2021-05-09 DIAGNOSIS — R7881 Bacteremia: Secondary | ICD-10-CM | POA: Diagnosis not present

## 2021-05-09 LAB — ECHOCARDIOGRAM COMPLETE
AR max vel: 3.15 cm2
AV Peak grad: 14 mmHg
Ao pk vel: 1.87 m/s
Area-P 1/2: 3.12 cm2
Height: 66 in
S' Lateral: 2.6 cm
Weight: 2768 oz

## 2021-05-09 LAB — GLUCOSE, CAPILLARY
Glucose-Capillary: 127 mg/dL — ABNORMAL HIGH (ref 70–99)
Glucose-Capillary: 132 mg/dL — ABNORMAL HIGH (ref 70–99)
Glucose-Capillary: 137 mg/dL — ABNORMAL HIGH (ref 70–99)
Glucose-Capillary: 156 mg/dL — ABNORMAL HIGH (ref 70–99)
Glucose-Capillary: 158 mg/dL — ABNORMAL HIGH (ref 70–99)
Glucose-Capillary: 174 mg/dL — ABNORMAL HIGH (ref 70–99)
Glucose-Capillary: 201 mg/dL — ABNORMAL HIGH (ref 70–99)

## 2021-05-09 LAB — BASIC METABOLIC PANEL
Anion gap: 10 (ref 5–15)
BUN: 30 mg/dL — ABNORMAL HIGH (ref 8–23)
CO2: 19 mmol/L — ABNORMAL LOW (ref 22–32)
Calcium: 10.2 mg/dL (ref 8.9–10.3)
Chloride: 105 mmol/L (ref 98–111)
Creatinine, Ser: 1.87 mg/dL — ABNORMAL HIGH (ref 0.61–1.24)
GFR, Estimated: 40 mL/min — ABNORMAL LOW (ref >60)
Glucose, Bld: 169 mg/dL — ABNORMAL HIGH (ref 70–99)
Potassium: 4.5 mmol/L (ref 3.5–5.1)
Sodium: 134 mmol/L — ABNORMAL LOW (ref 135–145)

## 2021-05-09 LAB — CBC
HCT: 29.3 % — ABNORMAL LOW (ref 39.0–52.0)
Hemoglobin: 8.7 g/dL — ABNORMAL LOW (ref 13.0–17.0)
MCH: 24.4 pg — ABNORMAL LOW (ref 26.0–34.0)
MCHC: 29.7 g/dL — ABNORMAL LOW (ref 30.0–36.0)
MCV: 82.3 fL (ref 80.0–100.0)
Platelets: 261 10*3/uL (ref 150–400)
RBC: 3.56 MIL/uL — ABNORMAL LOW (ref 4.22–5.81)
RDW: 18.6 % — ABNORMAL HIGH (ref 11.5–15.5)
WBC: 9.9 10*3/uL (ref 4.0–10.5)
nRBC: 0 % (ref 0.0–0.2)

## 2021-05-09 LAB — PROCALCITONIN: Procalcitonin: 44.19 ng/mL

## 2021-05-09 LAB — PHOSPHORUS: Phosphorus: 3.9 mg/dL (ref 2.5–4.6)

## 2021-05-09 LAB — LACTIC ACID, PLASMA
Lactic Acid, Venous: 2.6 mmol/L (ref 0.5–1.9)
Lactic Acid, Venous: 2.4 mmol/L (ref 0.5–1.9)

## 2021-05-09 LAB — MAGNESIUM: Magnesium: 1.9 mg/dL (ref 1.7–2.4)

## 2021-05-09 LAB — CORTISOL: Cortisol, Plasma: 14.8 ug/dL

## 2021-05-09 MED ORDER — VANCOMYCIN HCL IN DEXTROSE 1-5 GM/200ML-% IV SOLN
1000.0000 mg | INTRAVENOUS | Status: DC
  Administered 2021-05-09 – 2021-05-10 (×2): 1000 mg via INTRAVENOUS
  Filled 2021-05-09 (×2): qty 200

## 2021-05-09 MED ORDER — LACTATED RINGERS IV SOLN: INTRAVENOUS | Status: DC

## 2021-05-09 MED ORDER — LACTATED RINGERS IV BOLUS
500.0000 mL | Freq: Once | INTRAVENOUS | Status: AC
  Administered 2021-05-09: 500 mL via INTRAVENOUS
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
## 2021-05-09 NOTE — Progress Notes (Signed)
Pharmacy Antibiotic Note  Thomas Schroeder is a 60 y.o. male with hx bladder cancer, kidney stones, neurogenic bladder, right hydronephrosis (nephrostomy tube) and chronic foley presented to the ED on 05/07/2021 with c/o generalized weakness and hematuria.  Pharmacy has been consulted to dose cefepime for sepsis/UTI.   Blood culture now growing GPC in clusters in 1/4 bottles with BCID detecting Staph species. Pharmacy consulted to initiate vancomycin  05/09/21  Patient is spiking fevers Lactic acid elevated SCr improved  Plan: Increase vancomycin dosing to 1000 mg IV Q 24 hrs. Goal AUC 400-550. Expected AUC: 499 SCr used: 1.87  Continue cefepime 2 g iv q 12 hours  Will f/u renal function, culture results, and clinical course Levels if/when indicated  Height: '5\' 6"'$  (167.6 cm) Weight: 78.5 kg (173 lb) IBW/kg (Calculated) : 63.8  Temp (24hrs), Avg:99.9 F (37.7 C), Min:97.9 F (36.6 C), Max:102.1 F (38.9 C)  Recent Labs  Lab 05/07/21 1038 05/08/21 0244 05/08/21 1957 05/09/21 0329 05/09/21 0435  WBC 10.4 8.1 9.9 9.9  --   CREATININE 2.54* 2.24* 1.93* 1.87*  --   LATICACIDVEN 0.9  --  4.2*  --  2.6*     Estimated Creatinine Clearance: 40.9 mL/min (A) (by C-G formula based on SCr of 1.87 mg/dL (H)).    Allergies  Allergen Reactions   Bactrim Rash    Skin sloughing    Ropinirole Other (See Comments)    Increased leg spasms/poor sleep.    Latex Rash    Antimicrobials this admission: 8/22 cefepime>> 8/23 vanc >>  Dose adjustments this admission:  Microbiology results: Prev Cx 7/22 UCx R nephrostomy: Kleb pneumo, res Amp 1/31 UCx: E faecalis, pan-sens   8/22 bcx x2: GPC in clusters in 1/4; BCID w/ Staph species 8/22 ucx: multiple species    Thank you for allowing pharmacy to be a part of this patient's care.  Ulice Dash D 05/09/2021 7:52 AM

## 2021-05-10 DIAGNOSIS — R579 Shock, unspecified: Secondary | ICD-10-CM | POA: Diagnosis not present

## 2021-05-10 DIAGNOSIS — R6521 Severe sepsis with septic shock: Secondary | ICD-10-CM

## 2021-05-10 DIAGNOSIS — N390 Urinary tract infection, site not specified: Secondary | ICD-10-CM | POA: Diagnosis not present

## 2021-05-10 DIAGNOSIS — A419 Sepsis, unspecified organism: Secondary | ICD-10-CM | POA: Diagnosis not present

## 2021-05-10 LAB — COMPREHENSIVE METABOLIC PANEL
ALT: 129 U/L — ABNORMAL HIGH (ref 0–44)
AST: 85 U/L — ABNORMAL HIGH (ref 15–41)
Albumin: 2.2 g/dL — ABNORMAL LOW (ref 3.5–5.0)
Alkaline Phosphatase: 520 U/L — ABNORMAL HIGH (ref 38–126)
Anion gap: 6 (ref 5–15)
BUN: 25 mg/dL — ABNORMAL HIGH (ref 8–23)
CO2: 20 mmol/L — ABNORMAL LOW (ref 22–32)
Calcium: 10.1 mg/dL (ref 8.9–10.3)
Chloride: 109 mmol/L (ref 98–111)
Creatinine, Ser: 1.71 mg/dL — ABNORMAL HIGH (ref 0.61–1.24)
GFR, Estimated: 45 mL/min — ABNORMAL LOW (ref >60)
Glucose, Bld: 111 mg/dL — ABNORMAL HIGH (ref 70–99)
Potassium: 4 mmol/L (ref 3.5–5.1)
Sodium: 135 mmol/L (ref 135–145)
Total Bilirubin: 0.3 mg/dL (ref 0.3–1.2)
Total Protein: 6.1 g/dL — ABNORMAL LOW (ref 6.5–8.1)

## 2021-05-10 LAB — CBC
HCT: 28 % — ABNORMAL LOW (ref 39.0–52.0)
Hemoglobin: 8.2 g/dL — ABNORMAL LOW (ref 13.0–17.0)
MCH: 24.2 pg — ABNORMAL LOW (ref 26.0–34.0)
MCHC: 29.3 g/dL — ABNORMAL LOW (ref 30.0–36.0)
MCV: 82.6 fL (ref 80.0–100.0)
Platelets: 280 10*3/uL (ref 150–400)
RBC: 3.39 MIL/uL — ABNORMAL LOW (ref 4.22–5.81)
RDW: 18.9 % — ABNORMAL HIGH (ref 11.5–15.5)
WBC: 10.3 10*3/uL (ref 4.0–10.5)
nRBC: 0 % (ref 0.0–0.2)

## 2021-05-10 LAB — CULTURE, BLOOD (ROUTINE X 2): Special Requests: ADEQUATE

## 2021-05-10 LAB — PROCALCITONIN
Procalcitonin: 29.05 ng/mL
Procalcitonin: 21.81 ng/mL

## 2021-05-10 LAB — GLUCOSE, CAPILLARY
Glucose-Capillary: 113 mg/dL — ABNORMAL HIGH (ref 70–99)
Glucose-Capillary: 93 mg/dL (ref 70–99)

## 2021-05-10 LAB — LACTIC ACID, PLASMA: Lactic Acid, Venous: 1.9 mmol/L (ref 0.5–1.9)

## 2021-05-10 LAB — PHOSPHORUS: Phosphorus: 2.7 mg/dL (ref 2.5–4.6)

## 2021-05-10 MED ORDER — LACTATED RINGERS IV BOLUS
500.0000 mL | Freq: Once | INTRAVENOUS | Status: AC
  Administered 2021-05-10: 500 mL via INTRAVENOUS

## 2021-05-10 MED ORDER — PANTOPRAZOLE SODIUM 40 MG PO TBEC
40.0000 mg | DELAYED_RELEASE_TABLET | Freq: Every day | ORAL | Status: DC
  Administered 2021-05-10 – 2021-05-15 (×6): 40 mg via ORAL
  Filled 2021-05-10 (×6): qty 1
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
## 2021-05-10 NOTE — Progress Notes (Signed)
PIV consult: Arrived to room, site already established by ICU RN. Cancel consult.  It appears pt has had 5 PIVs in 2 days. Please consider PICC or central line for prolonged infusions.
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
## 2021-05-10 NOTE — Progress Notes (Signed)
eLink Physician-Brief Progress Note Patient Name: NAZARIO WHITEHAIR DOB: June 06, 1961 MRN: [id number]   Date of Service  05/10/2021  HPI/Events of Note  BP 89/45, MAP 57, patient in significantly negative fluid balance over the past 24 hours.  eICU Interventions  LR 500 ml iv bolus ordered.        Kerry Kass Marnae Madani 05/10/2021, 10:17 PM

## 2021-05-10 NOTE — Progress Notes (Addendum)
Pt BP low. BP 88/47 MAP 57. MD notified. Verbal order for 500cc LR bolus.

## 2021-05-11 LAB — CORTISOL: Cortisol, Plasma: 10 ug/dL

## 2021-05-11 LAB — CBC
HCT: 25.2 % — ABNORMAL LOW (ref 39.0–52.0)
Hemoglobin: 7.4 g/dL — ABNORMAL LOW (ref 13.0–17.0)
MCH: 24.1 pg — ABNORMAL LOW (ref 26.0–34.0)
MCHC: 29.4 g/dL — ABNORMAL LOW (ref 30.0–36.0)
MCV: 82.1 fL (ref 80.0–100.0)
Platelets: 287 10*3/uL (ref 150–400)
RBC: 3.07 MIL/uL — ABNORMAL LOW (ref 4.22–5.81)
RDW: 19 % — ABNORMAL HIGH (ref 11.5–15.5)
WBC: 9.4 10*3/uL (ref 4.0–10.5)
nRBC: 0 % (ref 0.0–0.2)

## 2021-05-11 LAB — COMPREHENSIVE METABOLIC PANEL
ALT: 108 U/L — ABNORMAL HIGH (ref 0–44)
AST: 55 U/L — ABNORMAL HIGH (ref 15–41)
Albumin: 2 g/dL — ABNORMAL LOW (ref 3.5–5.0)
Alkaline Phosphatase: 477 U/L — ABNORMAL HIGH (ref 38–126)
Anion gap: 8 (ref 5–15)
BUN: 26 mg/dL — ABNORMAL HIGH (ref 8–23)
CO2: 21 mmol/L — ABNORMAL LOW (ref 22–32)
Calcium: 10.8 mg/dL — ABNORMAL HIGH (ref 8.9–10.3)
Chloride: 108 mmol/L (ref 98–111)
Creatinine, Ser: 1.73 mg/dL — ABNORMAL HIGH (ref 0.61–1.24)
GFR, Estimated: 44 mL/min — ABNORMAL LOW (ref >60)
Glucose, Bld: 95 mg/dL (ref 70–99)
Potassium: 4.6 mmol/L (ref 3.5–5.1)
Sodium: 137 mmol/L (ref 135–145)
Total Bilirubin: 0.4 mg/dL (ref 0.3–1.2)
Total Protein: 5.9 g/dL — ABNORMAL LOW (ref 6.5–8.1)

## 2021-05-11 LAB — PROCALCITONIN: Procalcitonin: 16.44 ng/mL

## 2021-05-11 LAB — PHOSPHORUS: Phosphorus: 2.5 mg/dL (ref 2.5–4.6)

## 2021-05-11 LAB — MAGNESIUM: Magnesium: 1.8 mg/dL (ref 1.7–2.4)

## 2021-05-11 MED ORDER — ALBUMIN HUMAN 25 % IV SOLN
25.0000 g | Freq: Once | INTRAVENOUS | Status: AC
  Administered 2021-05-11: 25 g via INTRAVENOUS
  Filled 2021-05-11: qty 100

## 2021-05-11 NOTE — Progress Notes (Signed)
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 femoral fracture. IM rod in the right femur partially visualized. IMPRESSION: 1. Stable appearance of right nephrostomy catheter, with no hydronephrosis. 2. Extensive clot distends the lumen of the urinary bladder. 3. Stable necrotic left external iliac adenopathy. 4. Stable left renal atrophy and hydroureteronephrosis. Electronically Signed   By: Lucrezia Europe M.D.   On: 05/07/2021 14:52    Lab Data:  CBC: Recent Labs  Lab 05/07/21 1038 05/08/21 0244 05/08/21 1957 05/09/21 0329 05/10/21 0238  05/11/21 0246  WBC 10.4 8.1 9.9 9.9 10.3 9.4  NEUTROABS 8.7*  --   --   --   --   --   HGB 7.0* 7.1* 7.2* 8.7* 8.2* 7.4*  HCT 23.1* 24.5* 24.9* 29.3* 28.0* 25.2*  MCV 82.5 82.5 84.7 82.3 82.6 82.1  PLT 207 237 233 261 280 [id number]   Basic Metabolic Panel: Recent Labs  Lab 05/08/21 0244 05/08/21 1957 05/09/21 0329 05/10/21 0238 05/11/21 0246  NA 136 133* 134* 135 137  K 4.1 3.8 4.5 4.0 4.6  CL 108 107 105 109 108  CO2 22 16* 19* 20* 21*  GLUCOSE 87 235* 169* 111* 95  BUN 46* 30* 30* 25* 26*  CREATININE 2.24* 1.93* 1.87* 1.71* 1.73*  CALCIUM 10.2 10.1 10.2 10.1 10.8*  MG 2.1 1.7 1.9  --  1.8  PHOS 3.5 1.5* 3.9 2.7 2.5   GFR: Estimated Creatinine Clearance: 44.2 mL/min (A) (by C-G formula based on SCr of 1.73 mg/dL (H)). Liver Function Tests: Recent Labs  Lab 05/07/21 1038 05/08/21 1957 05/10/21 0238 05/11/21 0246  AST 42* 162* 85* 55*  ALT 48* 104* 129* 108*  ALKPHOS 196* 531* 520* 477*  BILITOT 0.5 0.6 0.3 0.4  PROT 5.6* 5.6* 6.1* 5.9*  ALBUMIN 2.0* 2.1* 2.2* 2.0*   No results for input(s): LIPASE, AMYLASE in the  last 168 hours. No results for input(s): AMMONIA in the last 168 hours. Coagulation Profile: Recent Labs  Lab 05/07/21 1038 05/08/21 1151 05/08/21 1957  INR 1.3* 1.2 1.3*   Cardiac Enzymes: No results for input(s): CKTOTAL, CKMB, CKMBINDEX, TROPONINI in the last 168 hours. BNP (last 3 results) No results for input(s): PROBNP in the last 8760 hours. HbA1C: No results for input(s): HGBA1C in the last 72 hours. CBG: Recent Labs  Lab 05/09/21 1543 05/09/21 1944 05/09/21 2256 05/10/21 0308 05/10/21 0743  GLUCAP 127* 158* 137* 113* 93   Lipid Profile: No results for input(s): CHOL, HDL, LDLCALC, TRIG, CHOLHDL, LDLDIRECT in the last 72 hours. Thyroid Function Tests: No results for input(s): TSH, T4TOTAL, FREET4, T3FREE, THYROIDAB in the last 72 hours. Anemia Panel: No results for input(s): VITAMINB12, FOLATE, FERRITIN, TIBC, IRON, RETICCTPCT in  the last 72 hours. Urine analysis:    Component Value Date/Time   COLORURINE YELLOW 05/07/2021 1038   APPEARANCEUR HAZY (A) 05/07/2021 1038   LABSPEC 1.003 (L) 05/07/2021 1038   PHURINE 7.0 05/07/2021 1038   GLUCOSEU NEGATIVE 05/07/2021 1038   HGBUR SMALL (A) 05/07/2021 1038   HGBUR large 06/19/2010 1630   BILIRUBINUR NEGATIVE 05/07/2021 1038   BILIRUBINUR negative 01/08/2021 1117   BILIRUBINUR Negative 08/27/2018 0920   KETONESUR NEGATIVE 05/07/2021 1038   PROTEINUR NEGATIVE 05/07/2021 1038   UROBILINOGEN 0.2 01/08/2021 1117   UROBILINOGEN 0.2 06/19/2010 1630   NITRITE NEGATIVE 05/07/2021 1038   LEUKOCYTESUR LARGE (A) 05/07/2021 1038     Leronda Lewers M.D. [facility] 05/11/2021, 1:10 PM  Available via Epic secure chat 7am-7pm After 7 pm, please refer to night coverage provider listed on amion.

## 2021-05-12 LAB — CBC
HCT: 27.3 % — ABNORMAL LOW (ref 39.0–52.0)
Hemoglobin: 8.1 g/dL — ABNORMAL LOW (ref 13.0–17.0)
MCH: 24.5 pg — ABNORMAL LOW (ref 26.0–34.0)
MCHC: 29.7 g/dL — ABNORMAL LOW (ref 30.0–36.0)
MCV: 82.7 fL (ref 80.0–100.0)
Platelets: 400 10*3/uL (ref 150–400)
RBC: 3.3 MIL/uL — ABNORMAL LOW (ref 4.22–5.81)
RDW: 19.2 % — ABNORMAL HIGH (ref 11.5–15.5)
WBC: 12.4 10*3/uL — ABNORMAL HIGH (ref 4.0–10.5)
nRBC: 0 % (ref 0.0–0.2)

## 2021-05-12 LAB — COMPREHENSIVE METABOLIC PANEL
ALT: 84 U/L — ABNORMAL HIGH (ref 0–44)
AST: 37 U/L (ref 15–41)
Albumin: 2.4 g/dL — ABNORMAL LOW (ref 3.5–5.0)
Alkaline Phosphatase: 463 U/L — ABNORMAL HIGH (ref 38–126)
Anion gap: 9 (ref 5–15)
BUN: 26 mg/dL — ABNORMAL HIGH (ref 8–23)
CO2: 20 mmol/L — ABNORMAL LOW (ref 22–32)
Calcium: 11.6 mg/dL — ABNORMAL HIGH (ref 8.9–10.3)
Chloride: 104 mmol/L (ref 98–111)
Creatinine, Ser: 1.71 mg/dL — ABNORMAL HIGH (ref 0.61–1.24)
GFR, Estimated: 45 mL/min — ABNORMAL LOW (ref >60)
Glucose, Bld: 86 mg/dL (ref 70–99)
Potassium: 4.8 mmol/L (ref 3.5–5.1)
Sodium: 133 mmol/L — ABNORMAL LOW (ref 135–145)
Total Bilirubin: 0.8 mg/dL (ref 0.3–1.2)
Total Protein: 6.4 g/dL — ABNORMAL LOW (ref 6.5–8.1)

## 2021-05-12 LAB — PHOSPHORUS: Phosphorus: 2.4 mg/dL — ABNORMAL LOW (ref 2.5–4.6)

## 2021-05-12 LAB — PROCALCITONIN: Procalcitonin: 9.18 ng/mL

## 2021-05-12 NOTE — Progress Notes (Signed)
Pharmacy Antibiotic Note  Thomas Schroeder is a 60 y.o. male with hx bladder cancer, kidney stones, neurogenic bladder, right hydronephrosis (nephrostomy tube) and chronic foley presented to the ED on 05/07/2021 with c/o generalized weakness and hematuria.  Pharmacy has been consulted to dose cefepime for sepsis/UTI.   Blood culture now growing GPC in clusters in 1/4 bottles with BCID detecting Staph species. Pharmacy consulted to initiate vancomycin  05/12/21  WBC 12.4 increased  Afebrile PCT 9.18 down significantly SCr 1.73 stable 8/26  Plan: Continue cefepime 2 g iv q 12 hours with plan to continue through 8/29 per ID  Will sign off and follow remotely  Thank you for allowing pharmacy to participate in this patient's care.    Height: '5\' 6"'$  (167.6 cm) Weight: 78.5 kg (173 lb) IBW/kg (Calculated) : 63.8  Temp (24hrs), Avg:98.4 F (36.9 C), Min:98 F (36.7 C), Max:99.1 F (37.3 C)  Recent Labs  Lab 05/07/21 1038 05/08/21 0244 05/08/21 1957 05/09/21 0329 05/09/21 0435 05/09/21 1346 05/10/21 0238 05/10/21 2241 05/11/21 0246 05/12/21 0256  WBC 10.4 8.1 9.9 9.9  --   --  10.3  --  9.4 12.4*  CREATININE 2.54* 2.24* 1.93* 1.87*  --   --  1.71*  --  1.73*  --   LATICACIDVEN 0.9  --  4.2*  --  2.6* 2.4*  --  1.9  --   --      Estimated Creatinine Clearance: 44.2 mL/min (A) (by C-G formula based on SCr of 1.73 mg/dL (H)).    Allergies  Allergen Reactions   Bactrim Rash    Skin sloughing    Ropinirole Other (See Comments)    Increased leg spasms/poor sleep.    Latex Rash    Napoleon Form 05/12/2021 9:48 AM

## 2021-05-12 NOTE — Progress Notes (Addendum)
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 necrotic left external iliac adenopathy. 4. Stable left renal atrophy and hydroureteronephrosis. Electronically Signed   By: Lucrezia Europe M.D.   On: 05/07/2021 14:52    Lab Data:  CBC: Recent Labs  Lab 05/07/21 1038 05/08/21 0244 05/08/21 1957 05/09/21 0329 05/10/21 0238 05/11/21 0246 05/12/21 0256  WBC 10.4   < > 9.9 9.9 10.3 9.4 12.4*  NEUTROABS 8.7*  --   --   --   --   --   --   HGB 7.0*   < > 7.2* 8.7* 8.2* 7.4* 8.1*  HCT 23.1*   < > 24.9* 29.3* 28.0* 25.2* 27.3*  MCV  82.5   < > 84.7 82.3 82.6 82.1 82.7  PLT 207   < > 233 261 280 287 400   < > = values in this interval not displayed.   Basic Metabolic Panel: Recent Labs  Lab 05/08/21 0244 05/08/21 1957 05/09/21 0329 05/10/21 0238 05/11/21 0246 05/12/21 0256  NA 136 133* 134* 135 137 133*  K 4.1 3.8 4.5 4.0 4.6 4.8  CL 108 107 105 109 108 104  CO2 22 16* 19* 20* 21* 20*  GLUCOSE 87 235* 169* 111* 95 86  BUN 46* 30* 30* 25* 26* 26*  CREATININE 2.24* 1.93* 1.87* 1.71* 1.73* 1.71*  CALCIUM 10.2 10.1 10.2 10.1 10.8* 11.6*  MG 2.1 1.7 1.9  --  1.8  --   PHOS 3.5 1.5* 3.9 2.7 2.5 2.4*   GFR: Estimated Creatinine Clearance: 44.7 mL/min (A) (by C-G formula based on SCr of 1.71 mg/dL (H)). Liver Function Tests: Recent Labs  Lab 05/07/21 1038 05/08/21 1957 05/10/21 0238 05/11/21 0246 05/12/21 0256  AST 42* 162* 85* 55* 37  ALT 48* 104* 129* 108* 84*  ALKPHOS 196* 531* 520* 477*  463*  BILITOT 0.5 0.6 0.3 0.4 0.8  PROT 5.6* 5.6* 6.1* 5.9* 6.4*  ALBUMIN 2.0* 2.1* 2.2* 2.0* 2.4*   No results for input(s): LIPASE, AMYLASE in the last 168 hours. No results for input(s): AMMONIA in the last 168 hours. Coagulation Profile: Recent Labs  Lab 05/07/21 1038 05/08/21 1151 05/08/21 1957  INR 1.3* 1.2 1.3*   Cardiac Enzymes: No results for input(s): CKTOTAL, CKMB, CKMBINDEX, TROPONINI in the last 168 hours. BNP (last 3 results) No results for input(s): PROBNP in the last 8760 hours. HbA1C: No results for input(s): HGBA1C in the last 72 hours. CBG: Recent Labs  Lab 05/09/21 1543 05/09/21 1944 05/09/21 2256 05/10/21 0308 05/10/21 0743  GLUCAP 127* 158* 137* 113* 93   Lipid Profile: No results for input(s): CHOL, HDL, LDLCALC, TRIG, CHOLHDL, LDLDIRECT in the last 72 hours. Thyroid Function Tests: No results for input(s): TSH, T4TOTAL, FREET4, T3FREE, THYROIDAB in the last 72 hours. Anemia Panel: No results for input(s): VITAMINB12, FOLATE, FERRITIN, TIBC, IRON, RETICCTPCT in the last 72  hours. Urine analysis:    Component Value Date/Time   COLORURINE YELLOW 05/07/2021 1038   APPEARANCEUR HAZY (A) 05/07/2021 1038   LABSPEC 1.003 (L) 05/07/2021 1038   PHURINE 7.0 05/07/2021 1038   GLUCOSEU NEGATIVE 05/07/2021 1038   HGBUR SMALL (A) 05/07/2021 1038   HGBUR large 06/19/2010 1630   BILIRUBINUR NEGATIVE 05/07/2021 1038   BILIRUBINUR negative 01/08/2021 1117   BILIRUBINUR Negative 08/27/2018 0920   KETONESUR NEGATIVE 05/07/2021 1038   PROTEINUR NEGATIVE 05/07/2021 1038   UROBILINOGEN 0.2 01/08/2021 1117   UROBILINOGEN 0.2 06/19/2010 1630   NITRITE NEGATIVE 05/07/2021 1038   LEUKOCYTESUR LARGE (A) 05/07/2021 1038     Svetlana Bagby M.D. [facility] 05/12/2021, 12:38 PM  Available via Epic secure chat 7am-7pm After 7 pm, please refer to night coverage provider listed on amion.

## 2021-05-13 LAB — COMPREHENSIVE METABOLIC PANEL
ALT: 63 U/L — ABNORMAL HIGH (ref 0–44)
AST: 25 U/L (ref 15–41)
Albumin: 2.3 g/dL — ABNORMAL LOW (ref 3.5–5.0)
Alkaline Phosphatase: 400 U/L — ABNORMAL HIGH (ref 38–126)
Anion gap: 8 (ref 5–15)
BUN: 23 mg/dL (ref 8–23)
CO2: 22 mmol/L (ref 22–32)
Calcium: 12 mg/dL — ABNORMAL HIGH (ref 8.9–10.3)
Chloride: 109 mmol/L (ref 98–111)
Creatinine, Ser: 1.55 mg/dL — ABNORMAL HIGH (ref 0.61–1.24)
GFR, Estimated: 51 mL/min — ABNORMAL LOW (ref >60)
Glucose, Bld: 94 mg/dL (ref 70–99)
Potassium: 4.5 mmol/L (ref 3.5–5.1)
Sodium: 139 mmol/L (ref 135–145)
Total Bilirubin: 0.6 mg/dL (ref 0.3–1.2)
Total Protein: 6.3 g/dL — ABNORMAL LOW (ref 6.5–8.1)

## 2021-05-13 LAB — CBC
HCT: 25.1 % — ABNORMAL LOW (ref 39.0–52.0)
Hemoglobin: 7.4 g/dL — ABNORMAL LOW (ref 13.0–17.0)
MCH: 24.3 pg — ABNORMAL LOW (ref 26.0–34.0)
MCHC: 29.5 g/dL — ABNORMAL LOW (ref 30.0–36.0)
MCV: 82.6 fL (ref 80.0–100.0)
Platelets: 395 10*3/uL (ref 150–400)
RBC: 3.04 MIL/uL — ABNORMAL LOW (ref 4.22–5.81)
RDW: 18.8 % — ABNORMAL HIGH (ref 11.5–15.5)
WBC: 11.3 10*3/uL — ABNORMAL HIGH (ref 4.0–10.5)
nRBC: 0 % (ref 0.0–0.2)

## 2021-05-13 NOTE — Progress Notes (Signed)
Nephrostomy bag emptied

## 2021-05-13 NOTE — Progress Notes (Signed)
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 distends the lumen of the urinary bladder. 3. Stable necrotic left external iliac adenopathy. 4. Stable left renal atrophy and hydroureteronephrosis. Electronically Signed   By: Lucrezia Europe M.D.   On: 05/07/2021 14:52    Lab Data:  CBC: Recent Labs  Lab 05/07/21 1038 05/08/21 0244 05/09/21 0329 05/10/21 0238 05/11/21 0246 05/12/21 0256 05/13/21 0253  WBC 10.4   < > 9.9 10.3 9.4 12.4* 11.3*  NEUTROABS 8.7*  --   --   --   --   --   --   HGB 7.0*   < > 8.7* 8.2* 7.4* 8.1* 7.4*  HCT 23.1*   < > 29.3*  28.0* 25.2* 27.3* 25.1*  MCV 82.5   < > 82.3 82.6 82.1 82.7 82.6  PLT 207   < > 261 280 287 400 395   < > = values in this interval not displayed.   Basic Metabolic Panel: Recent Labs  Lab 05/08/21 0244 05/08/21 1957 05/09/21 0329 05/10/21 0238 05/11/21 0246 05/12/21 0256 05/13/21 0253  NA 136 133* 134* 135 137 133* 139  K 4.1 3.8 4.5 4.0 4.6 4.8 4.5  CL 108 107 105 109 108 104 109  CO2 22 16* 19* 20* 21* 20* 22  GLUCOSE 87 235* 169* 111* 95 86 94  BUN 46* 30* 30* 25* 26* 26* 23  CREATININE 2.24* 1.93* 1.87* 1.71* 1.73* 1.71* 1.55*  CALCIUM 10.2 10.1 10.2 10.1 10.8* 11.6* 12.0*  MG 2.1 1.7 1.9  --  1.8  --   --   PHOS 3.5 1.5* 3.9 2.7 2.5 2.4*  --    GFR: Estimated Creatinine Clearance: 50.1 mL/min (A) (by C-G formula based on SCr of 1.55 mg/dL (H)). Liver Function Tests: Recent Labs  Lab 05/08/21 1957 05/10/21 0238 05/11/21 0246 05/12/21 0256 05/13/21 0253  AST 162* 85*  55* 37 25  ALT 104* 129* 108* 84* 63*  ALKPHOS 531* 520* 477* 463* 400*  BILITOT 0.6 0.3 0.4 0.8 0.6  PROT 5.6* 6.1* 5.9* 6.4* 6.3*  ALBUMIN 2.1* 2.2* 2.0* 2.4* 2.3*   No results for input(s): LIPASE, AMYLASE in the last 168 hours. No results for input(s): AMMONIA in the last 168 hours. Coagulation Profile: Recent Labs  Lab 05/07/21 1038 05/08/21 1151 05/08/21 1957  INR 1.3* 1.2 1.3*   Cardiac Enzymes: No results for input(s): CKTOTAL, CKMB, CKMBINDEX, TROPONINI in the last 168 hours. BNP (last 3 results) No results for input(s): PROBNP in the last 8760 hours. HbA1C: No results for input(s): HGBA1C in the last 72 hours. CBG: Recent Labs  Lab 05/09/21 1543 05/09/21 1944 05/09/21 2256 05/10/21 0308 05/10/21 0743  GLUCAP 127* 158* 137* 113* 93   Lipid Profile: No results for input(s): CHOL, HDL, LDLCALC, TRIG, CHOLHDL, LDLDIRECT in the last 72 hours. Thyroid Function Tests: No results for input(s): TSH, T4TOTAL, FREET4, T3FREE, THYROIDAB in the last 72 hours. Anemia Panel: No  results for input(s): VITAMINB12, FOLATE, FERRITIN, TIBC, IRON, RETICCTPCT in the last 72 hours. Urine analysis:    Component Value Date/Time   COLORURINE YELLOW 05/07/2021 1038   APPEARANCEUR HAZY (A) 05/07/2021 1038   LABSPEC 1.003 (L) 05/07/2021 1038   PHURINE 7.0 05/07/2021 1038   GLUCOSEU NEGATIVE 05/07/2021 1038   HGBUR SMALL (A) 05/07/2021 1038   HGBUR large 06/19/2010 1630   BILIRUBINUR NEGATIVE 05/07/2021 1038   BILIRUBINUR negative 01/08/2021 1117   BILIRUBINUR Negative 08/27/2018 0920   KETONESUR NEGATIVE 05/07/2021 1038   PROTEINUR NEGATIVE 05/07/2021 1038   UROBILINOGEN 0.2 01/08/2021 1117   UROBILINOGEN 0.2 06/19/2010 1630   NITRITE NEGATIVE 05/07/2021 1038   LEUKOCYTESUR LARGE (A) 05/07/2021 1038     Trinisha Paget M.D. [facility] 05/13/2021, 11:52 AM  Available via Epic secure chat 7am-7pm After 7 pm, please refer to night coverage provider listed on amion.

## 2021-05-13 NOTE — Plan of Care (Signed)
  Problem: Fluid Volume: Goal: Hemodynamic stability will improve Outcome: Progressing   Problem: Clinical Measurements: Goal: Signs and symptoms of infection will decrease Outcome: Progressing   Problem: Clinical Measurements: Goal: Diagnostic test results will improve Outcome: Progressing Goal: Signs and symptoms of infection will decrease Outcome: Progressing

## 2021-05-13 NOTE — Progress Notes (Signed)
Patient requests to speak w/ physician before scheduled procedure w/ IR tomorrow

## 2021-05-14 ENCOUNTER — Inpatient Hospital Stay (HOSPITAL_COMMUNITY): Payer: BC Managed Care – PPO

## 2021-05-14 HISTORY — PX: IR NEPHROSTOMY EXCHANGE RIGHT: IMG6070

## 2021-05-14 LAB — COMPREHENSIVE METABOLIC PANEL
ALT: 55 U/L — ABNORMAL HIGH (ref 0–44)
AST: 27 U/L (ref 15–41)
Albumin: 2.3 g/dL — ABNORMAL LOW (ref 3.5–5.0)
Alkaline Phosphatase: 333 U/L — ABNORMAL HIGH (ref 38–126)
Anion gap: 7 (ref 5–15)
BUN: 21 mg/dL (ref 8–23)
CO2: 19 mmol/L — ABNORMAL LOW (ref 22–32)
Calcium: 12.4 mg/dL — ABNORMAL HIGH (ref 8.9–10.3)
Chloride: 108 mmol/L (ref 98–111)
Creatinine, Ser: 1.63 mg/dL — ABNORMAL HIGH (ref 0.61–1.24)
GFR, Estimated: 48 mL/min — ABNORMAL LOW (ref >60)
Glucose, Bld: 119 mg/dL — ABNORMAL HIGH (ref 70–99)
Potassium: 4.3 mmol/L (ref 3.5–5.1)
Sodium: 134 mmol/L — ABNORMAL LOW (ref 135–145)
Total Bilirubin: 0.4 mg/dL (ref 0.3–1.2)
Total Protein: 6.4 g/dL — ABNORMAL LOW (ref 6.5–8.1)

## 2021-05-14 LAB — CBC
HCT: 26.4 % — ABNORMAL LOW (ref 39.0–52.0)
Hemoglobin: 7.7 g/dL — ABNORMAL LOW (ref 13.0–17.0)
MCH: 25 pg — ABNORMAL LOW (ref 26.0–34.0)
MCHC: 29.2 g/dL — ABNORMAL LOW (ref 30.0–36.0)
MCV: 85.7 fL (ref 80.0–100.0)
Platelets: 367 10*3/uL (ref 150–400)
RBC: 3.08 MIL/uL — ABNORMAL LOW (ref 4.22–5.81)
RDW: 19.3 % — ABNORMAL HIGH (ref 11.5–15.5)
WBC: 9 10*3/uL (ref 4.0–10.5)
nRBC: 0 % (ref 0.0–0.2)

## 2021-05-14 LAB — PROCALCITONIN: Procalcitonin: 3.14 ng/mL

## 2021-05-14 MED ORDER — LIDOCAINE HCL 1 % IJ SOLN
INTRAMUSCULAR | Status: DC | PRN
  Administered 2021-05-14: 5 mL via INTRADERMAL

## 2021-05-14 MED ORDER — IOHEXOL 300 MG/ML  SOLN
25.0000 mL | Freq: Once | INTRAMUSCULAR | Status: AC | PRN
  Administered 2021-05-14: 10 mL

## 2021-05-14 MED ORDER — ALBUMIN HUMAN 25 % IV SOLN
25.0000 g | Freq: Once | INTRAVENOUS | Status: AC
  Administered 2021-05-14: 25 g via INTRAVENOUS
  Filled 2021-05-14: qty 100

## 2021-05-14 MED ORDER — LIDOCAINE HCL 1 % IJ SOLN
INTRAMUSCULAR | Status: AC
  Filled 2021-05-14: qty 20

## 2021-05-14 NOTE — Progress Notes (Signed)
Nephrostomy bag emptied

## 2021-05-14 NOTE — Progress Notes (Signed)
I followed up on Thomas Schroeder today in the IR pre-op area. He is getting cefepime through today, so it's a good day to change his right neph tube. It's been in for over a month and he was due for a change anyway 9/2. He looks well.   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 Cr still remains elevated at 1.6. Given sepsis and decreased kidney fxn, probably best to proceed with cystectomy (if pt can get strong enough to get it). Has an appt with Dr. Tresa Moore to discuss left nephro-ureterectomy and cystectomy 05/22/2021.    3) right hydronephrosis - right neph tube change today.

## 2021-05-14 NOTE — Progress Notes (Signed)
Nephrostomy bag emptied 225 mL. 60 mL flushed into foley, no output for 30 minutes.

## 2021-05-14 NOTE — Progress Notes (Signed)
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 fracture. IM rod in the right femur partially visualized. IMPRESSION: 1. Stable appearance of right nephrostomy catheter, with no hydronephrosis. 2. Extensive clot distends the lumen of the urinary bladder. 3. Stable necrotic left external iliac adenopathy. 4. Stable left renal atrophy and hydroureteronephrosis. Electronically Signed   By: Lucrezia Europe M.D.   On: 05/07/2021 14:52   IR NEPHROSTOMY EXCHANGE RIGHT  Result Date: 05/14/2021 INDICATION: Routine exchange. EXAM: FLUOROSCOPIC GUIDED RIGHT SIDED NEPHROSTOMY  CATHETER EXCHANGE COMPARISON:  IR fluoroscopy, 04/06/2021. CT abdomen pelvis, 05/07/2021. CONTRAST:  10 mL Isovue-300 administered into the collecting system FLUOROSCOPY TIME:  0 minutes 18 seconds COMPLICATIONS: None immediate. TECHNIQUE: Informed written consent was obtained from the patient after a discussion of the risks, benefits and alternatives to treatment. Questions regarding the procedure were encouraged and answered. A timeout was performed prior to the initiation of the procedure. The RIGHT flank and external portion of existing nephrostomy catheter were prepped and draped in the usual sterile fashion. A sterile drape was applied covering the operative field. Maximum barrier sterile technique with sterile gowns and gloves were used for the procedure. A timeout was performed prior to the initiation of the procedure. A pre procedural spot fluoroscopic image was obtained after contrast was injected via the existing nephrostomy catheter demonstrating appropriate positioning within the renal pelvis. The existing nephrostomy catheter was cut and cannulated with an Amplatz wire which was coiled within the renal pelvis. Under intermittent fluoroscopic guidance, the existing nephrostomy catheter was exchanged for a new 10.2 [place] all-purpose drainage catheter. Contrast injection confirmed appropriate positioning within the renal pelvis and a post exchange fluoroscopic image was obtained. The catheter was locked and secured to the skin with a suture. A dressing was placed. The patient tolerated the procedure well without immediate postprocedural complication. FINDINGS: The existing nephrostomy catheter is  appropriately positioned and functioning. After successful fluoroscopic guided exchange, the new nephrostomy catheter is coiled and locked within the right renal pelvis. IMPRESSION: Successful exchange of RIGHT sided 10.2 French percutaneous nephrostomy catheter, as above. Patient is to return to interventional  radiology for routine RIGHT PCN exchange in 3 months. Michaelle Birks, MD [facility] Radiology Electronically Signed   By: Michaelle Birks M.D.   On: 05/14/2021 13:20    Lab Data:  CBC: Recent Labs  Lab 05/10/21 0238 05/11/21 0246 05/12/21 0256 05/13/21 0253 05/14/21 0433  WBC 10.3 9.4 12.4* 11.3* 9.0  HGB 8.2* 7.4* 8.1* 7.4* 7.7*  HCT 28.0* 25.2* 27.3* 25.1* 26.4*  MCV 82.6 82.1 82.7 82.6 85.7  PLT 280 287 400 395 [id number]   Basic Metabolic Panel: Recent Labs  Lab 05/08/21 0244 05/08/21 1957 05/09/21 0329 05/10/21 0238 05/11/21 0246 05/12/21 0256 05/13/21 0253 05/14/21 0433  NA 136 133* 134* 135 137 133* 139 134*  K 4.1 3.8 4.5 4.0 4.6 4.8 4.5 4.3  CL 108 107 105 109 108 104 109 108  CO2 22 16* 19* 20* 21* 20* 22 19*  GLUCOSE 87 235* 169* 111* 95 86 94 119*  BUN 46* 30* 30* 25* 26* 26* 23 21  CREATININE 2.24* 1.93* 1.87* 1.71* 1.73* 1.71* 1.55* 1.63*  CALCIUM 10.2 10.1 10.2 10.1 10.8* 11.6* 12.0* 12.4*  MG 2.1 1.7 1.9  --  1.8  --   --   --   PHOS 3.5 1.5* 3.9 2.7 2.5 2.4*  --   --    GFR: Estimated Creatinine Clearance: 47.1 mL/min (A) (by C-G formula based on SCr of 1.63 mg/dL (H)). Liver Function Tests: Recent Labs  Lab 05/10/21 0238 05/11/21 0246 05/12/21 0256 05/13/21 0253 05/14/21 0433  AST 85* 55* 37 25 27  ALT 129* 108* 84* 63* 55*  ALKPHOS 520* 477* 463* 400* 333*  BILITOT 0.3 0.4 0.8 0.6 0.4  PROT 6.1* 5.9* 6.4* 6.3* 6.4*  ALBUMIN 2.2* 2.0* 2.4* 2.3* 2.3*   No results for input(s): LIPASE, AMYLASE in the last 168 hours. No results for input(s): AMMONIA in the last 168 hours. Coagulation Profile: Recent Labs  Lab 05/08/21 1151 05/08/21 1957  INR 1.2 1.3*   Cardiac Enzymes: No results for input(s): CKTOTAL, CKMB, CKMBINDEX, TROPONINI in the last 168 hours. BNP (last 3 results) No results for input(s): PROBNP in the last 8760 hours. HbA1C: No results for input(s): HGBA1C in the last 72  hours. CBG: Recent Labs  Lab 05/09/21 1543 05/09/21 1944 05/09/21 2256 05/10/21 0308 05/10/21 0743  GLUCAP 127* 158* 137* 113* 93   Lipid Profile: No results for input(s): CHOL, HDL, LDLCALC, TRIG, CHOLHDL, LDLDIRECT in the last 72 hours. Thyroid Function Tests: No results for input(s): TSH, T4TOTAL, FREET4, T3FREE, THYROIDAB in the last 72 hours. Anemia Panel: No results for input(s): VITAMINB12, FOLATE, FERRITIN, TIBC, IRON, RETICCTPCT in the last 72 hours. Urine analysis:    Component Value Date/Time   COLORURINE YELLOW 05/07/2021 1038   APPEARANCEUR HAZY (A) 05/07/2021 1038   LABSPEC 1.003 (L) 05/07/2021 1038   PHURINE 7.0 05/07/2021 1038   GLUCOSEU NEGATIVE 05/07/2021 1038   HGBUR SMALL (A) 05/07/2021 1038   HGBUR large 06/19/2010 1630   BILIRUBINUR NEGATIVE 05/07/2021 1038   BILIRUBINUR negative 01/08/2021 1117   BILIRUBINUR Negative 08/27/2018 0920   KETONESUR NEGATIVE 05/07/2021 1038   PROTEINUR NEGATIVE 05/07/2021 1038   UROBILINOGEN 0.2 01/08/2021 1117   UROBILINOGEN 0.2 06/19/2010 1630   NITRITE NEGATIVE  05/07/2021 1038   [place] (A) 05/07/2021 1038     Caroll Weinheimer M.D. [facility] 05/14/2021, 1:36 PM  Available via Epic secure chat 7am-7pm After 7 pm, please refer to night coverage provider listed on amion.
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
## 2021-05-14 NOTE — Progress Notes (Signed)
261m emptied out of nephrostomy bag

## 2021-05-14 NOTE — Progress Notes (Signed)
457m emptied out of nephrostomy

## 2021-05-14 NOTE — Progress Notes (Signed)
Went to flush Thomas Schroeder foley as ordered when I flushed in 2 76m intervals per patients requested, was unable to flush the 2nd 358mas the foley was clogged, attempted to manipulate in order to break up the blockage was unable to and patient requested that I remove the foley and place a new one. Upon removal of foley a moderate amount of milky pink fluid was expelled with tissue like pieces that were approximately size of peanuts, numerous other smaller tissue like pieces.

## 2021-05-14 NOTE — Procedures (Signed)
Vascular and Interventional Radiology Procedure Note  Patient: Thomas Schroeder DOB: October 02, 1960 Medical Record Number: [id number] Note Date/Time: 05/14/21 1:06 PM   Performing Physician: Michaelle Birks, MD Assistant(s): None  Diagnosis: Catheter malfunction.  Procedure:  RIGHT NEPHROSTOMY TUBE EXCHANGE RIGHT ANTEROGRADE NEPHROSTOGRAM  Anesthesia: Conscious Sedation Complications: None Estimated Blood Loss:  0 mL Specimens:  None  Findings:  Successful exchange for a right-sided, 10 F nephrostomy tube into the right kidney(s).   Plan: Resume previous  care. Follow up for routine nephrostomy tube exchange in 3 month(s).   See detailed procedure note with images in PACS. The patient tolerated the procedure well without incident or complication and was returned to Floor Bed in stable condition.    Michaelle Birks, MD [facility]ascular and Interventional Radiology Specialists [facility] Radiology   Pager. [place]

## 2021-05-15 LAB — COMPREHENSIVE METABOLIC PANEL
ALT: 41 U/L (ref 0–44)
AST: 17 U/L (ref 15–41)
Albumin: 2.5 g/dL — ABNORMAL LOW (ref 3.5–5.0)
Alkaline Phosphatase: 339 U/L — ABNORMAL HIGH (ref 38–126)
Anion gap: 7 (ref 5–15)
BUN: 21 mg/dL (ref 8–23)
CO2: 20 mmol/L — ABNORMAL LOW (ref 22–32)
Calcium: 12.5 mg/dL — ABNORMAL HIGH (ref 8.9–10.3)
Chloride: 109 mmol/L (ref 98–111)
Creatinine, Ser: 1.63 mg/dL — ABNORMAL HIGH (ref 0.61–1.24)
GFR, Estimated: 48 mL/min — ABNORMAL LOW (ref >60)
Glucose, Bld: 98 mg/dL (ref 70–99)
Potassium: 4.5 mmol/L (ref 3.5–5.1)
Sodium: 136 mmol/L (ref 135–145)
Total Bilirubin: 0.4 mg/dL (ref 0.3–1.2)
Total Protein: 6.6 g/dL (ref 6.5–8.1)

## 2021-05-15 LAB — CULTURE, BLOOD (ROUTINE X 2)
Culture: NO GROWTH
Culture: NO GROWTH
Special Requests: ADEQUATE
Special Requests: ADEQUATE

## 2021-05-15 LAB — CBC
HCT: 25.7 % — ABNORMAL LOW (ref 39.0–52.0)
Hemoglobin: 7.5 g/dL — ABNORMAL LOW (ref 13.0–17.0)
MCH: 25.2 pg — ABNORMAL LOW (ref 26.0–34.0)
MCHC: 29.2 g/dL — ABNORMAL LOW (ref 30.0–36.0)
MCV: 86.2 fL (ref 80.0–100.0)
Platelets: 404 10*3/uL — ABNORMAL HIGH (ref 150–400)
RBC: 2.98 MIL/uL — ABNORMAL LOW (ref 4.22–5.81)
RDW: 19.4 % — ABNORMAL HIGH (ref 11.5–15.5)
WBC: 11 10*3/uL — ABNORMAL HIGH (ref 4.0–10.5)
nRBC: 0 % (ref 0.0–0.2)

## 2021-05-15 MED ORDER — ALBUMIN HUMAN 25 % IV SOLN
25.0000 g | Freq: Four times a day (QID) | INTRAVENOUS | Status: DC
  Administered 2021-05-15 (×2): 25 g via INTRAVENOUS
  Filled 2021-05-15 (×2): qty 100
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
## 2021-05-15 NOTE — Progress Notes (Signed)
I provided support to [facility].  He is grateful that today is his last day in the hospital. I provided listening presence as he shared some of his frustrations with me.  I provided social support as he shared about some of his interests as well.  [place], Bcc Pager, [phone] 1:33 PM

## 2021-05-15 NOTE — Progress Notes (Signed)
This RN reviewed AVS discharge paperwork with patient. Questions answered. Belongings returned to patient. Patient transported self down to main entrance via personal wheelchair. RN accompanied patient. Patient safely transitioned into vehicle. Education and care plan completed. PIVs removed.

## 2021-05-15 NOTE — Progress Notes (Signed)
Nephrostomy bag emptied @ 2000, 2200, [place], [place], 0430, & [id number]. Volumes noted in intake/output flowsheet.

## 2021-05-15 NOTE — Progress Notes (Signed)
22 Fr latex free foley replaced. Previously attempted to flush last foley w/ 60 mL per order, no output noted after 30 minutes. Pt visibly more flushed, requesting removal/replacement of foley. Catheter removed, red/pink/mucus/tissue output noted after removal. Placed new catheter, flushed w/ 20 mL saline, debris/tissue output noted.

## 2021-05-15 NOTE — Plan of Care (Signed)
  Problem: Education: Goal: Knowledge of General Education information will improve Description: Including pain rating scale, medication(s)/side effects and non-pharmacologic comfort measures Outcome: Adequate for Discharge   Problem: Health Behavior/Discharge Planning: Goal: Ability to manage health-related needs will improve Outcome: Adequate for Discharge   Problem: Clinical Measurements: Goal: Ability to maintain clinical measurements within normal limits will improve Outcome: Adequate for Discharge Goal: Will remain free from infection Outcome: Adequate for Discharge Goal: Diagnostic test results will improve Outcome: Adequate for Discharge Goal: Respiratory complications will improve Outcome: Adequate for Discharge Goal: Cardiovascular complication will be avoided Outcome: Adequate for Discharge   Problem: Activity: Goal: Risk for activity intolerance will decrease Outcome: Adequate for Discharge   Problem: Nutrition: Goal: Adequate nutrition will be maintained Outcome: Adequate for Discharge   Problem: Coping: Goal: Level of anxiety will decrease Outcome: Adequate for Discharge   Problem: Elimination: Goal: Will not experience complications related to bowel motility Outcome: Adequate for Discharge Goal: Will not experience complications related to urinary retention Outcome: Adequate for Discharge   Problem: Pain Managment: Goal: General experience of comfort will improve Outcome: Adequate for Discharge   Problem: Safety: Goal: Ability to remain free from injury will improve Outcome: Adequate for Discharge   Problem: Skin Integrity: Goal: Risk for impaired skin integrity will decrease Outcome: Adequate for Discharge   Problem: Fluid Volume: Goal: Hemodynamic stability will improve Outcome: Adequate for Discharge   Problem: Clinical Measurements: Goal: Diagnostic test results will improve Outcome: Adequate for Discharge Goal: Signs and symptoms of  infection will decrease Outcome: Adequate for Discharge   Problem: Respiratory: Goal: Ability to maintain adequate ventilation will improve Outcome: Adequate for Discharge   Problem: Fluid Volume: Goal: Hemodynamic stability will improve Outcome: Adequate for Discharge   Problem: Clinical Measurements: Goal: Diagnostic test results will improve Outcome: Adequate for Discharge Goal: Signs and symptoms of infection will decrease Outcome: Adequate for Discharge   Problem: Respiratory: Goal: Ability to maintain adequate ventilation will improve Outcome: Adequate for Discharge   

## 2021-05-16 ENCOUNTER — Telehealth: Payer: Self-pay | Admitting: General Practice

## 2021-05-16 ENCOUNTER — Other Ambulatory Visit (HOSPITAL_COMMUNITY): Payer: Self-pay | Admitting: Interventional Radiology

## 2021-05-16 DIAGNOSIS — N1339 Other hydronephrosis: Secondary | ICD-10-CM

## 2021-05-16 DIAGNOSIS — N135 Crossing vessel and stricture of ureter without hydronephrosis: Secondary | ICD-10-CM

## 2021-05-16 NOTE — Telephone Encounter (Signed)
Transition Care Management Follow-up Telephone Call Date of discharge and from where: 05/15/21 from [facility] How have you been since you were released from the hospital? Still feeling weak and tired. Any questions or concerns? No  Items Reviewed: Did the pt receive and understand the discharge instructions provided? Yes  Medications obtained and verified? Yes  Other? No  Any new allergies since your discharge? No  Dietary orders reviewed? Yes Do you have support at home? Yes   Home Care and Equipment/Supplies: Were home health services ordered? no  Functional Questionnaire: (I = Independent and D = Dependent) ADLs: I  Bathing/Dressing- I  Meal Prep- I  Eating- I  Maintaining continence- I  Transferring/Ambulation- I  Managing Meds- I  Follow up appointments reviewed:  PCP Hospital f/u appt confirmed? Yes  Scheduled to see Dr. Madilyn Fireman on 05/17/21 @ 1130. [facility] f/u appt confirmed? No. Patient will call to schedule. Are transportation arrangements needed? No  If their condition worsens, is the pt aware to call PCP or go to the Emergency Dept.? Yes Was the patient provided with contact information for the PCP's office or ED? Yes Was to pt encouraged to call back with questions or concerns? Yes

## 2021-05-17 ENCOUNTER — Telehealth (INDEPENDENT_AMBULATORY_CARE_PROVIDER_SITE_OTHER): Payer: BC Managed Care – PPO | Admitting: Family Medicine

## 2021-05-17 ENCOUNTER — Encounter: Payer: Self-pay | Admitting: Family Medicine

## 2021-05-17 ENCOUNTER — Other Ambulatory Visit: Payer: Self-pay

## 2021-05-17 VITALS — BP 108/77 | HR 99

## 2021-05-17 DIAGNOSIS — L98491 Non-pressure chronic ulcer of skin of other sites limited to breakdown of skin: Secondary | ICD-10-CM | POA: Diagnosis not present

## 2021-05-17 DIAGNOSIS — D649 Anemia, unspecified: Secondary | ICD-10-CM

## 2021-05-17 DIAGNOSIS — N179 Acute kidney failure, unspecified: Secondary | ICD-10-CM | POA: Diagnosis not present

## 2021-05-17 DIAGNOSIS — Z8619 Personal history of other infectious and parasitic diseases: Secondary | ICD-10-CM | POA: Diagnosis not present

## 2021-05-17 DIAGNOSIS — C679 Malignant neoplasm of bladder, unspecified: Secondary | ICD-10-CM

## 2021-05-17 DIAGNOSIS — N1831 Chronic kidney disease, stage 3a: Secondary | ICD-10-CM

## 2021-05-17 NOTE — Assessment & Plan Note (Signed)
Notations is limited to skin breakdown but it may be further into the tissue.  Assessment based on virtual visit.  He does feel like the wound is healing.  They have continue to take care of it at home.

## 2021-05-17 NOTE — Assessment & Plan Note (Signed)
Acute kidney injury on top of CKD 3-due to recheck renal function.

## 2021-05-17 NOTE — Assessment & Plan Note (Signed)
Coming appointment with Dr. Tresa Moore to discuss further treatment options.

## 2021-05-17 NOTE — Progress Notes (Signed)
Pt is taking care of pressure ulcer cleaning and dressing this daily.   Denies any f/s/c/n/v/d.  Pt reports just being physically tired. He is continuing to work from home.

## 2021-05-17 NOTE — Assessment & Plan Note (Signed)
Globin at discharge around 7.5.  Plan to recheck next week.

## 2021-05-17 NOTE — Progress Notes (Signed)
Virtual Visit via Video Note  I connected with Thomas Schroeder on 05/17/21 at 11:30 AM EDT by a video enabled telemedicine application and verified that I am speaking with the correct person using two identifiers.   I discussed the limitations of evaluation and management by telemedicine and the availability of in person appointments. The patient expressed understanding and agreed to proceed.  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  He has been following with Dr. Junious Silk.  Was admitted on 8.22 for sepsis and d/c home on 8/30.  He was admitted to the ICU.  He had severe hypotension and fevers.  Cultures grew staph hominis.  Negative echocardiogram for vegetations.  He completed a course of cefepime for treatment.  Over the last 6 mo he has had protein calorie malnutrition and FTT with significant weight loss.  He also had acute kidney injury with CKD 3.  He has chronic left renal atrophy and right hydronephrosis with nephrostomy tube.  Creatinine at discharge was around 1.6.  He did undergo right-sided nephrostomy tube exchange on 829.  His baseline hemoglobin was around 7.5 at the time of discharge.  Does have an upcoming appointment with Dr. Tammi Klippel to discuss nephroureterectomy and possible cystectomy on September 6.  F/U perineal ulcer -wound is currently measuring 1.5 x 1.5 cm Depth 0.5 x 0.3 cm, feels likes like it is healing.      Past medical history, Surgical history, Family history not pertinant except as noted below, Social history, Allergies, and medications have been entered into the medical record, reviewed, and corrections made.    Objective:    General: Speaking clearly in complete sentences without any shortness of breath.  Alert and oriented x3.  Normal judgment. No apparent acute distress.    Impression and Recommendations:    Perineal ulcer, limited to  breakdown of skin ([place]) Notations is limited to skin breakdown but it may be further into the tissue.  Assessment based on virtual visit.  He does feel like the wound is healing.  They have continue to take care of it at home.  Stage 3a chronic kidney disease ([place]) Acute kidney injury on top of CKD 3-due to recheck renal function.  Normochromic anemia Globin at discharge around 7.5.  Plan to recheck next week.  Urothelial carcinoma of bladder ([place]) Coming appointment with Dr. Tresa Moore to discuss further treatment options.   Orders Placed This Encounter  Procedures   CBC with Differential/Platelet   BASIC METABOLIC PANEL WITH GFR     No orders of the defined types were placed in this encounter.    I discussed the assessment and treatment plan with the patient. The patient was provided an opportunity to ask questions and all were answered. The patient agreed with the plan and demonstrated an understanding of the instructions.   The patient was advised to call back or seek an in-person evaluation if the symptoms worsen or if the condition fails to improve as anticipated.  I spent 45 minutes on the day of the encounter to include pre-visit record review, face-to-face time with the patient and post visit ordering of test.   Thomas Lecher, MD

## 2021-05-18 ENCOUNTER — Telehealth: Payer: Self-pay | Admitting: Family Medicine

## 2021-05-18 NOTE — Telephone Encounter (Signed)
Please see MyChart note sent to patient.   @TODAY @

## 2021-05-22 DIAGNOSIS — C775 Secondary and unspecified malignant neoplasm of intrapelvic lymph nodes: Secondary | ICD-10-CM | POA: Diagnosis not present

## 2021-05-22 DIAGNOSIS — N310 Uninhibited neuropathic bladder, not elsewhere classified: Secondary | ICD-10-CM | POA: Diagnosis not present

## 2021-05-22 DIAGNOSIS — C678 Malignant neoplasm of overlapping sites of bladder: Secondary | ICD-10-CM | POA: Diagnosis not present

## 2021-05-22 DIAGNOSIS — N261 Atrophy of kidney (terminal): Secondary | ICD-10-CM | POA: Diagnosis not present

## 2021-05-22 NOTE — Telephone Encounter (Signed)
Pls forward labs rom video visit.

## 2021-05-24 ENCOUNTER — Other Ambulatory Visit: Payer: Self-pay | Admitting: Urology

## 2021-05-25 LAB — CULTURE, BLOOD (ROUTINE X 2)
Culture: NO GROWTH
Special Requests: ADEQUATE

## 2021-05-25 NOTE — Progress Notes (Signed)
Sorry Thomas Schroeder,  I apologize for the error.  When I clicked the mammogram it was right below your's and when I clicked I was off just slightly and it sent to you instead.  I realized it as soon as I hit the button and it disappeared.  Again I apologize.  It looks like your blood cultures were negative from the hospital which is great.  Plesae disregard the mammogram message.

## 2021-05-25 NOTE — Progress Notes (Signed)
Please call patient. Normal mammogram.  Repeat in 1 year.  

## 2021-06-04 ENCOUNTER — Ambulatory Visit: Payer: BC Managed Care – PPO | Admitting: Urology

## 2021-06-12 ENCOUNTER — Emergency Department (HOSPITAL_BASED_OUTPATIENT_CLINIC_OR_DEPARTMENT_OTHER)
Admission: EM | Admit: 2021-06-12 | Discharge: 2021-06-13 | Disposition: A | Discharge status: Home / Self Care | Payer: BC Managed Care – PPO | Attending: Emergency Medicine | Admitting: Emergency Medicine

## 2021-06-12 ENCOUNTER — Other Ambulatory Visit: Payer: Self-pay

## 2021-06-12 ENCOUNTER — Emergency Department (HOSPITAL_BASED_OUTPATIENT_CLINIC_OR_DEPARTMENT_OTHER): Payer: BC Managed Care – PPO

## 2021-06-12 ENCOUNTER — Telehealth: Payer: Self-pay | Admitting: *Deleted

## 2021-06-12 ENCOUNTER — Encounter (HOSPITAL_BASED_OUTPATIENT_CLINIC_OR_DEPARTMENT_OTHER): Payer: Self-pay

## 2021-06-12 ENCOUNTER — Telehealth (HOSPITAL_BASED_OUTPATIENT_CLINIC_OR_DEPARTMENT_OTHER): Payer: Self-pay | Admitting: Nurse Practitioner

## 2021-06-12 ENCOUNTER — Telehealth: Payer: Self-pay

## 2021-06-12 DIAGNOSIS — Z7982 Long term (current) use of aspirin: Secondary | ICD-10-CM | POA: Diagnosis not present

## 2021-06-12 DIAGNOSIS — N1831 Chronic kidney disease, stage 3a: Secondary | ICD-10-CM | POA: Diagnosis not present

## 2021-06-12 DIAGNOSIS — L89159 Pressure ulcer of sacral region, unspecified stage: Secondary | ICD-10-CM | POA: Diagnosis not present

## 2021-06-12 DIAGNOSIS — R509 Fever, unspecified: Secondary | ICD-10-CM | POA: Diagnosis not present

## 2021-06-12 DIAGNOSIS — Z91040 Latex allergy status: Secondary | ICD-10-CM | POA: Insufficient documentation

## 2021-06-12 DIAGNOSIS — D72829 Elevated white blood cell count, unspecified: Secondary | ICD-10-CM | POA: Diagnosis not present

## 2021-06-12 DIAGNOSIS — T148XXA Other injury of unspecified body region, initial encounter: Secondary | ICD-10-CM

## 2021-06-12 LAB — CBC WITH DIFFERENTIAL/PLATELET
Abs Immature Granulocytes: 0.13 10*3/uL — ABNORMAL HIGH (ref 0.00–0.07)
Basophils Absolute: 0.1 10*3/uL (ref 0.0–0.1)
Basophils Relative: 0 %
Eosinophils Absolute: 0.2 10*3/uL (ref 0.0–0.5)
Eosinophils Relative: 1 %
HCT: 29.6 % — ABNORMAL LOW (ref 39.0–52.0)
Hemoglobin: 9 g/dL — ABNORMAL LOW (ref 13.0–17.0)
Immature Granulocytes: 1 %
Lymphocytes Relative: 8 %
Lymphs Abs: 1.1 10*3/uL (ref 0.7–4.0)
MCH: 25.1 pg — ABNORMAL LOW (ref 26.0–34.0)
MCHC: 30.4 g/dL (ref 30.0–36.0)
MCV: 82.7 fL (ref 80.0–100.0)
Monocytes Absolute: 1.1 10*3/uL — ABNORMAL HIGH (ref 0.1–1.0)
Monocytes Relative: 8 %
Neutro Abs: 10.9 10*3/uL — ABNORMAL HIGH (ref 1.7–7.7)
Neutrophils Relative %: 82 %
Platelets: 488 10*3/uL — ABNORMAL HIGH (ref 150–400)
RBC: 3.58 MIL/uL — ABNORMAL LOW (ref 4.22–5.81)
RDW: 17 % — ABNORMAL HIGH (ref 11.5–15.5)
WBC: 13.6 10*3/uL — ABNORMAL HIGH (ref 4.0–10.5)
nRBC: 0 % (ref 0.0–0.2)

## 2021-06-12 LAB — BASIC METABOLIC PANEL
Anion gap: 10 (ref 5–15)
BUN: 30 mg/dL — ABNORMAL HIGH (ref 8–23)
CO2: 21 mmol/L — ABNORMAL LOW (ref 22–32)
Calcium: 11.5 mg/dL — ABNORMAL HIGH (ref 8.9–10.3)
Chloride: 100 mmol/L (ref 98–111)
Creatinine, Ser: 1.66 mg/dL — ABNORMAL HIGH (ref 0.61–1.24)
GFR, Estimated: 47 mL/min — ABNORMAL LOW (ref >60)
Glucose, Bld: 215 mg/dL — ABNORMAL HIGH (ref 70–99)
Potassium: 4.2 mmol/L (ref 3.5–5.1)
Sodium: 131 mmol/L — ABNORMAL LOW (ref 135–145)

## 2021-06-12 MED ORDER — SODIUM CHLORIDE 0.9 % IV BOLUS: 1000.0000 mL | Freq: Once | INTRAVENOUS | Status: DC

## 2021-06-12 MED ORDER — CEPHALEXIN 500 MG PO CAPS: 500.0000 mg | ORAL_CAPSULE | Freq: Four times a day (QID) | ORAL | 0 refills | Status: DC

## 2021-06-12 NOTE — Telephone Encounter (Signed)
Pt's SO Selinda Eon) called back to find out about the message that she left earlier today.   I informed her that Dr. Madilyn Fireman has been busy up thru lunch and is now in clinic however understanding the urgency of this matter I will resend this note and see if I can find out what direction she advises for her to take with regards to next steps.   She voiced understanding.

## 2021-06-12 NOTE — ED Notes (Signed)
MD made aware that Patient BP was trending downward.

## 2021-06-12 NOTE — ED Triage Notes (Addendum)
Pt states  he has stage 3  pressure ulcer to his buttocks and was told to come over to  ED for possible infection on site.   Pt is paraplegic and has hx   CA of bladder.  Was admitted recently due to sepsis

## 2021-06-12 NOTE — ED Provider Notes (Addendum)
[facility] EMERGENCY DEPT Provider Note   CSN: [id number] Arrival date & time: 06/12/21  1839     History Chief Complaint  Patient presents with   Wound Check    Thomas Schroeder is a 60 y.o. male.  This is a 60 y.o. male with significant medical history as below, including CKD, paraplegia, invasive bladder cancer who presents to the ED with complaint of wound check.  Chronic wound to his left sacral decubitus region.  Spouse concerned for possible infection.  They have been unable to reach primary care doctor to help set up wound care the past 24 hours and so they reported to the ER for evaluation.  Spouse has been dressing the wound at home.  Location: Sacral decubitus Duration: Greater than 1 month Onset: Gradual Timing: Constant Description: No significant discomfort although there is scant drainage from the wound Exacerbating/Alleviating Factors: None Associated Symptoms: No nausea or vomiting.  No fevers or chills.  No recent medication or dietary changes.    The history is provided by the patient and the spouse. No language interpreter was used.  Wound Check This is a chronic problem. The current episode started more than 1 week ago. The problem has been gradually worsening. Pertinent negatives include no chest pain, no abdominal pain, no headaches and no shortness of breath.      Past Medical History:  Diagnosis Date   Allergy    Chronic indwelling Foley catheter    Chronic kidney disease    ED (erectile dysfunction)    History of recurrent UTIs    Night muscle spasms    Paraplegia [facility])     Patient Active Problem List   Diagnosis Date Noted   Stage 3a chronic kidney disease ([place]) 05/17/2021   Normochromic anemia 05/17/2021   Urothelial carcinoma of bladder ([place]) 05/17/2021   Shock circulatory ([place]) 05/07/2021   Pressure injury of skin 04/06/2021   Obstructive uropathy 04/05/2021   Grade II internal hemorrhoids    Rectal polyp    Rectal ulcer     Colon cancer screening 01/16/2021   Positive colorectal cancer screening using Cologuard test 01/16/2021   Hip fracture ([place]) 06/23/2020   Closed comminuted intertrochanteric fracture of left femur ([place]) 06/23/2020   Muscle spasticity 19/14/7829   Complicated UTI (urinary tract infection) 04/20/2020   Severe sepsis with septic shock ([place]) 04/20/2020   Alcohol use 04/20/2020   Anxiety 04/20/2020   Hyperlipidemia 06/16/2015   Recurrent kidney stones 08/04/2014   History of decubitus ulcer 10/18/2013   Male erectile disorder 10/02/2012   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   Perineal ulcer, limited to breakdown of skin ([place]) 09/04/2011   Microscopic colitis 04/12/2011   Nonspecific findings on examination of blood 03/30/2009   Paraplegia ([place]) 03/13/2009   Osteopenia 03/13/2009   Bone/cartilage disorder 03/13/2009    Past Surgical History:  Procedure Laterality Date   APPENDECTOMY     BIOPSY  03/22/2021   Procedure: BIOPSY;  Surgeon: Lavena Bullion, DO;  Location: [facility];  Service: Gastroenterology;;   COLONOSCOPY N/A 03/22/2021   Procedure: COLONOSCOPY;  Surgeon: Lavena Bullion, DO;  Location: [facility];  Service: Gastroenterology;  Laterality: N/A;   CYSTOSCOPY W/ RETROGRADES Bilateral 04/05/2021   Procedure: CYSTOSCOPY;  Surgeon: Cleon Gustin, MD;  Location: [facility];  Service: Urology;  Laterality: Bilateral;   FEMUR FRACTURE SURGERY     INTRAMEDULLARY (IM) NAIL INTERTROCHANTERIC Left 06/24/2020   Procedure: INTRAMEDULLARY (IM) NAIL FEMORAL;  Surgeon: Mcarthur Rossetti, MD;  Location: [facility];  Service: Orthopedics;  Laterality: Left;   IR NEPHROSTOMY EXCHANGE RIGHT  05/14/2021   IR NEPHROSTOMY PLACEMENT RIGHT  04/06/2021   IR US GUIDE VASC ACCESS LEFT  04/06/2021   kidney stone removal     POLYPECTOMY  03/22/2021   Procedure: POLYPECTOMY;  Surgeon: Lavena Bullion, DO;  Location: [facility];  Service: Gastroenterology;;   RT tibia  fracture     T8 T9 fusion      TONSILLECTOMY     TRANSURETHRAL RESECTION OF BLADDER TUMOR N/A 04/05/2021   Procedure: TRANSURETHRAL RESECTION OF BLADDER TUMOR (TURBT);  Surgeon: Cleon Gustin, MD;  Location: [facility];  Service: Urology;  Laterality: N/A;       Family History  Problem Relation Age of Onset   Hyperlipidemia Mother    Diabetes Mother    Rosacea Mother    Lung cancer Mother        smoker   Hypertension Neg Hx     Social History   Tobacco Use   Smoking status: Never   Smokeless tobacco: Never  Vaping Use   Vaping Use: Never used  Substance Use Topics   Alcohol use: Yes    Alcohol/week: 10.0 standard drinks    Types: 10 Standard drinks or equivalent per week    Comment: per week   Drug use: Not Currently    Home Medications Prior to Admission medications   Medication Sig Start Date End Date Taking? Authorizing Provider  cephALEXin (KEFLEX) 500 MG capsule Take 1 capsule (500 mg total) by mouth 4 (four) times daily. 06/12/21  Yes Wynona Dove A, DO  aspirin EC 325 MG tablet Take 325 mg by mouth daily.    [provider]  atorvastatin (LIPITOR) 20 MG tablet TAKE 1 TABLET AT BEDTIME Patient taking differently: Take 20 mg by mouth daily. 11/21/20   Hali Marry, MD  CRANBERRY EXTRACT PO Take 4,200 mg by mouth daily.    [provider]  diazepam (VALIUM) 10 MG tablet Take 10 mg by mouth every 6 (six) hours as needed for anxiety.    [provider]  Multiple Vitamin (MULTI VITAMIN MENS PO) Take 1 tablet by mouth daily.    [provider]  Omega-3 Fatty Acids (FISH OIL) 1200 MG CAPS Take 1,200 mg by mouth daily.    [provider]    Allergies    Bactrim, Ropinirole, and Latex  Review of Systems   Review of Systems  Constitutional:  Negative for appetite change and chills.  HENT:  Negative for ear pain and sore throat.   Eyes:  Negative for pain and visual disturbance.  Respiratory:  Negative for cough and  shortness of breath.   Cardiovascular:  Negative for chest pain and palpitations.  Gastrointestinal:  Negative for abdominal pain and vomiting.  Genitourinary:  Negative for dysuria and hematuria.  Musculoskeletal:  Negative for arthralgias and back pain.  Skin:  Positive for wound. Negative for color change and rash.  Neurological:  Negative for seizures, syncope and headaches.  All other systems reviewed and are negative.  Physical Exam Updated Vital Signs BP (!) 88/48   Pulse 99   Temp 100.2 F (37.9 C) (Oral)   Resp 18   Wt 68 kg   SpO2 100%   BMI 24.21 kg/m   Physical Exam Vitals and nursing note reviewed. Exam conducted with a chaperone present.  Constitutional:      General: He is not in acute distress.  Appearance: Normal appearance. He is well-developed.  HENT:     Head: Normocephalic and atraumatic.     Right Ear: External ear normal.     Left Ear: External ear normal.     Mouth/Throat:     Mouth: Mucous membranes are moist.  Eyes:     General: No scleral icterus. Cardiovascular:     Rate and Rhythm: Normal rate and regular rhythm.     Pulses: Normal pulses.     Heart sounds: Normal heart sounds.  Pulmonary:     Effort: Pulmonary effort is normal. No respiratory distress.     Breath sounds: Normal breath sounds.  Abdominal:     General: Abdomen is flat.     Palpations: Abdomen is soft.     Tenderness: There is no abdominal tenderness.  Genitourinary:   Musculoskeletal:        General: Normal range of motion.     Cervical back: Normal range of motion.     Right lower leg: No edema.     Left lower leg: No edema.  Skin:    General: Skin is warm and dry.     Capillary Refill: Capillary refill takes less than 2 seconds.  Neurological:     Mental Status: He is alert and oriented to person, place, and time.  Psychiatric:        Mood and Affect: Mood normal.        Behavior: Behavior normal.    ED Results / Procedures / Treatments   Labs (all labs  ordered are listed, but only abnormal results are displayed) Labs Reviewed  BASIC METABOLIC PANEL - Abnormal; Notable for the following components:      Result Value   Sodium 131 (*)    CO2 21 (*)    Glucose, Bld 215 (*)    BUN 30 (*)    Creatinine, Ser 1.66 (*)    Calcium 11.5 (*)    GFR, Estimated 47 (*)    All other components within normal limits  CBC WITH DIFFERENTIAL/PLATELET - Abnormal; Notable for the following components:   WBC 13.6 (*)    RBC 3.58 (*)    Hemoglobin 9.0 (*)    HCT 29.6 (*)    MCH 25.1 (*)    RDW 17.0 (*)    Platelets 488 (*)    Neutro Abs 10.9 (*)    Monocytes Absolute 1.1 (*)    Abs Immature Granulocytes 0.13 (*)    All other components within normal limits    EKG None  Radiology DG Chest Portable 1 View  Result Date: 06/12/2021 CLINICAL DATA:  Fever EXAM: PORTABLE CHEST 1 VIEW COMPARISON:  05/09/2021 FINDINGS: The heart size and mediastinal contours are within normal limits. Both lungs are clear. The visualized skeletal structures are unremarkable. IMPRESSION: No active disease. Electronically Signed   By: Fidela Salisbury M.D.   On: 06/12/2021 23:24    Procedures Procedures   Medications Ordered in ED Medications - No data to display   ED Course  I have reviewed the triage vital signs and the nursing notes.  Pertinent labs & imaging results that were available during my care of the patient were reviewed by me and considered in my medical decision making (see chart for details).    MDM Rules/Calculators/A&P                         This patient complains of sacral decubitus wound; this involves an extensive number  of treatment options and is a complaint that carries with it a high risk of complications and morbidity. Vital signs were reviewed and are stable. Serious etiologies considered.   I ordered, reviewed and interpreted labs, BMP with renal function similar to baseline. He has mild leukocytosis on CBC. Hgb is similar to his baseline.    CXR was reviewed and is stable.   Patient does have low grade fever, per spouse and patient he will typically have fevers at night time chronically and this has been ongoing a/w his malignancy. Patient became upset when I asked him further details regarding his fevers.   I ordered medication keflex  I ordered imaging studies which included CXR and I independently visualized and interpreted imaging which showed no acute process  Additional history obtained from spouse  Previous records obtained and reviewed   Patient with sacral decubitus ulcer, the wound appears clean and intact. Healing appropriately. Given patients history will start empiric antibiotics. Wound dressing changed at bedside.   I did recommend further evaluation however patient became upset with nursing and was asking to leave. Patient upset over length of stay in the ED. See nursing documentation for details regarding this. He did not want any further blood draws, imaging or IVF. Refusing further assessments. Did not want to have his urine assessed for infection. Says he wants to go home and go to bed. During my assessment of the patient he was treated with the utmost respect at all times to the best of my abilities. I listened to his concerns at bedside and had extensive discussion with patient and spouse regarding care in the ED and our shared goals today. Unable to rule out osteomyelitis without imaging but my suspicion for this is low. Discussed local wound care with spouse and patient at bedside and SW consult was placed for home wound care. Advised pt to f/u with his pcp in the next 24 hours for ongoing evaluation of sacral wound and complaints. His mentation is appropriate. He is speaking clearly in full sentences. He and spouse are able to contract for safety.   The patient improved significantly and was discharged in stable condition. Detailed discussions were had with the patient regarding current findings, and need for  close f/u with PCP or on call doctor. The patient has been instructed to return immediately if the symptoms worsen in any way for re-evaluation. Patient verbalized understanding and is in agreement with current care plan. All questions answered prior to discharge.    Final Clinical Impression(s) / ED Diagnoses Final diagnoses:  Pressure injury of skin of sacral region, unspecified injury stage    Rx / DC Orders ED Discharge Orders          Ordered    cephALEXin (KEFLEX) 500 MG capsule  4 times daily        06/12/21 2334             Jeanell Sparrow, DO 06/13/21 0045    Jeanell Sparrow, DO 06/14/21 1117

## 2021-06-12 NOTE — Telephone Encounter (Signed)
Pt's significant other called stating that pt has a pressure ulcer measuring 4.2 x 2.4 with tunneling of 2.5.  Selinda Eon states that the bandage this morning had green drainage on it.  Selinda Eon is requesting referral to wound care.  Please advise.  Charyl Bigger, CMA

## 2021-06-12 NOTE — Telephone Encounter (Signed)
On Call Note:  Notified by nurse line that patient wife had called to report increased drainage from pressure wound with concerns of infection present. Patient wife requesting to see if antibiotics could be called in tonight as opposed to being seen after hours in ED/UC. Patient reportedly in no distress at this time.   Review of chart reveals recent hospitalization for sepsis with ICU stay and active treatment for bladder cancer.   Based on recent health history, recommend that patient be seen for evaluation tonight in person. Given history, I feel that visualization of the wound, patients vitals, and cultures are warranted prior to beginning antibiotic treatment. Patient at risk of complications and/or rapid decompensation through the night given his current immunocompromised state.  On call nurse to discuss with patients wife and will notify me if further questions or concerns arise.    Worthy Keeler, DNP, AGNP-C [facility] Phone: [phone]

## 2021-06-12 NOTE — ED Notes (Signed)
This RN entered the Examination Room to complete Blood Specimen Collections and provide Patient care when patient stated he would like the MD to come into the Room to instruct them to dress the wound and "get the fuck outta here". Patient states he is just frustrated with how long he has been here and would like to leave. MD notified.

## 2021-06-12 NOTE — Telephone Encounter (Signed)
There are 2 messages open in this regard.  I would recommend evaluation in the emergency department.  We can certainly try to order home health wound care but unfortunately they are usually quite booked out and I would likely not be able to get anyone there this week.

## 2021-06-12 NOTE — ED Notes (Signed)
Upon entering the Examination Room to discharge the Patient. The patient stated he never stated he would like to leave and wanted to have another individual draw blood specimens. He requested Roselyn Reef, NT to draw blood specimens and have the MD enter Examination Room to speak to Patient. MD at Bedside to speak with patient.

## 2021-06-13 ENCOUNTER — Telehealth: Payer: Self-pay | Admitting: General Practice

## 2021-06-13 NOTE — Telephone Encounter (Signed)
Pt was seen in the ED last night.

## 2021-06-13 NOTE — Telephone Encounter (Addendum)
Transition Care Management Unsuccessful Follow-up Telephone Call  Date of discharge and from where:  06/13/21 from [facility]  Attempts:  1st Attempt  Reason for unsuccessful TCM follow-up call:  Left voice message   Please schedule patient a two day follow up when he calls back.

## 2021-06-14 ENCOUNTER — Ambulatory Visit: Payer: BC Managed Care – PPO | Admitting: Family Medicine

## 2021-06-15 NOTE — Telephone Encounter (Signed)
Transition Care Management Unsuccessful Follow-up Telephone Call  Date of discharge and from where:  06/13/21 from [facility]  Attempts:  2nd Attempt  Reason for unsuccessful TCM follow-up call:  Left voice message Please schedule a patient two day follow up when he calls back.

## 2021-06-18 ENCOUNTER — Ambulatory Visit (INDEPENDENT_AMBULATORY_CARE_PROVIDER_SITE_OTHER): Payer: BC Managed Care – PPO | Admitting: Urology

## 2021-06-18 ENCOUNTER — Telehealth: Payer: Self-pay | Admitting: Family Medicine

## 2021-06-18 ENCOUNTER — Other Ambulatory Visit: Payer: Self-pay

## 2021-06-18 VITALS — BP 102/61 | HR 97 | Temp 98.1°F

## 2021-06-18 DIAGNOSIS — L89152 Pressure ulcer of sacral region, stage 2: Secondary | ICD-10-CM | POA: Diagnosis not present

## 2021-06-18 DIAGNOSIS — K621 Rectal polyp: Secondary | ICD-10-CM

## 2021-06-18 DIAGNOSIS — C672 Malignant neoplasm of lateral wall of bladder: Secondary | ICD-10-CM | POA: Diagnosis not present

## 2021-06-18 DIAGNOSIS — N319 Neuromuscular dysfunction of bladder, unspecified: Secondary | ICD-10-CM | POA: Diagnosis not present

## 2021-06-18 NOTE — Telephone Encounter (Signed)
Transition Care Management Follow-up Telephone Call Date of discharge and from where: 06/13/21 from [facility] How have you been since you were released from the hospital? Still taking antibiotics. He feels that the antibiotics should continue longer than they are prescribed for. O Any questions or concerns? No  Items Reviewed: Did the pt receive and understand the discharge instructions provided? Yes  Medications obtained and verified? No  Other? No  Any new allergies since your discharge? No  Dietary orders reviewed? Yes Do you have support at home? Yes   Home Care and Equipment/Supplies: Were home health services ordered? no  Functional Questionnaire: (I = Independent and D = Dependent) ADLs: I  Bathing/Dressing- I  Meal Prep- I  Eating- I  Maintaining continence- D  Transferring/Ambulation- D  Managing Meds- I  Follow up appointments reviewed:  PCP Hospital f/u appt confirmed? No. I offered the appointment for tomorrow (in-office or virutal) but patient stated that he will call back to schedule. [facility] f/u appt confirmed? No   Are transportation arrangements needed? No  If their condition worsens, is the pt aware to call PCP or go to the Emergency Dept.? Yes Was the patient provided with contact information for the PCP's office or ED? Yes Was to pt encouraged to call back with questions or concerns? Yes

## 2021-06-18 NOTE — Progress Notes (Signed)
Urological Symptom Review  Patient is experiencing the following symptoms: N/a   Review of Systems  Gastrointestinal (upper)  : Negative for upper GI symptoms  Gastrointestinal (lower) : Negative for lower GI symptoms  Constitutional : Weight loss Fatigue  Skin: Negative for skin symptoms  Eyes: Negative for eye symptoms  Ear/Nose/Throat : Negative for Ear/Nose/Throat symptoms  Hematologic/Lymphatic: Negative for Hematologic/Lymphatic symptoms  Cardiovascular : Leg swelling  Respiratory : Negative for respiratory symptoms  Endocrine: Negative for endocrine symptoms  Musculoskeletal: Negative for musculoskeletal symptoms  Neurological: Dizziness  Psychologic: Negative for psychiatric symptoms

## 2021-06-18 NOTE — Telephone Encounter (Signed)
Patient stated that he has decubitus ulcer  and knows we have emergency appointment open and wants to see PCP. [facility] only and patient is a 40 minute patient did not want to see anyone else. Please advise.
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
## 2021-06-18 NOTE — Telephone Encounter (Signed)
Patient scheduled.

## 2021-06-19 ENCOUNTER — Ambulatory Visit (INDEPENDENT_AMBULATORY_CARE_PROVIDER_SITE_OTHER): Payer: BC Managed Care – PPO | Admitting: Family Medicine

## 2021-06-19 ENCOUNTER — Encounter: Payer: Self-pay | Admitting: Family Medicine

## 2021-06-19 VITALS — BP 116/60 | HR 78 | Temp 97.6°F | Ht 66.0 in | Wt 149.0 lb

## 2021-06-19 DIAGNOSIS — L98491 Non-pressure chronic ulcer of skin of other sites limited to breakdown of skin: Secondary | ICD-10-CM | POA: Diagnosis not present

## 2021-06-19 DIAGNOSIS — Z23 Encounter for immunization: Secondary | ICD-10-CM | POA: Diagnosis not present

## 2021-06-19 DIAGNOSIS — L98493 Non-pressure chronic ulcer of skin of other sites with necrosis of muscle: Secondary | ICD-10-CM | POA: Diagnosis not present

## 2021-06-19 MED ORDER — CEPHALEXIN 500 MG PO CAPS: 500.0000 mg | ORAL_CAPSULE | Freq: Four times a day (QID) | ORAL | 0 refills | Status: DC

## 2021-06-19 NOTE — Progress Notes (Signed)
Established Patient Office Visit  Subjective:  Patient ID: Thomas Schroeder, male    DOB: 11/13/1960  Age: 60 y.o. MRN: [id number]  CC:  Chief Complaint  Patient presents with   Pressure Ulcer    HPI Thomas Schroeder presents for hospital follow up.    He has unfortunately recently developed a left peroneal tunneling wound.  His wife had actually called last week because she was very concerned that it was starting to drain.  He went to the emergency room on Friday and was evaluated they placed him on Keflex.  His wife reports that the drainage is now less green and is little bit more yellow with a little bit of blood-tinged.  She has been packing it with some saline gauze and then putting some dry gauze on the outside.  No fevers.  Has had some sweats at night. Wants to get a nurse in to the home for wound care for [place].  Still on Keflex and the drainage is still bloody but more yellow than green now.    Past Medical History:  Diagnosis Date   Allergy    Chronic indwelling Foley catheter    Chronic kidney disease    ED (erectile dysfunction)    History of recurrent UTIs    Night muscle spasms    Paraplegia [facility])     Past Surgical History:  Procedure Laterality Date   APPENDECTOMY     BIOPSY  03/22/2021   Procedure: BIOPSY;  Surgeon: Lavena Bullion, DO;  [facility];  Service: Gastroenterology;;   COLONOSCOPY N/A 03/22/2021   Procedure: COLONOSCOPY;  Surgeon: Lavena Bullion, DO;  [facility];  Service: Gastroenterology;  Laterality: N/A;   CYSTOSCOPY W/ RETROGRADES Bilateral 04/05/2021   Procedure: CYSTOSCOPY;  Surgeon: Cleon Gustin, MD;  Location[facility];  Service: Urology;  Laterality: Bilateral;   FEMUR FRACTURE SURGERY     INTRAMEDULLARY (IM) NAIL INTERTROCHANTERIC Left 06/24/2020   Procedure: INTRAMEDULLARY (IM) NAIL FEMORAL;  Surgeon: Mcarthur Rossetti, MD;  [facility];  Service: Orthopedics;  Laterality: Left;   IR NEPHROSTOMY  EXCHANGE RIGHT  05/14/2021   IR NEPHROSTOMY PLACEMENT RIGHT  04/06/2021   IR US GUIDE VASC ACCESS LEFT  04/06/2021   kidney stone removal     POLYPECTOMY  03/22/2021   Procedure: POLYPECTOMY;  Surgeon: Lavena Bullion, DO;  [facility];  Service: Gastroenterology;;   RT tibia fracture     T8 T9 fusion      TONSILLECTOMY     TRANSURETHRAL RESECTION OF BLADDER TUMOR N/A 04/05/2021   Procedure: TRANSURETHRAL RESECTION OF BLADDER TUMOR (TURBT);  Surgeon: Cleon Gustin, MD;  Location[facility];  Service: Urology;  Laterality: N/A;    Family History  Problem Relation Age of Onset   Hyperlipidemia Mother    Diabetes Mother    Rosacea Mother    Lung cancer Mother        smoker   Hypertension Neg Hx     Social History   Socioeconomic History   Marital status: Soil scientist    Spouse name: Not on file   Number of children: 2   Years of education: Not on file   Highest education level: Not on file  Occupational History    Employer: [employer]  Tobacco Use   Smoking status: Never   Smokeless tobacco: Never  Vaping Use   Vaping Use: Never used  Substance and Sexual Activity   Alcohol use: Yes  Alcohol/week: 10.0 standard drinks    Types: 10 Standard drinks or equivalent per week    Comment: per week   Drug use: Not Currently   Sexual activity: Yes    Partners: Female  Other Topics Concern   Not on file  Social History Narrative   1 caffeine drinks per day.  Some exercise.    Social Determinants of Health   Financial Resource Strain: Not on file  Food Insecurity: Not on file  Transportation Needs: Not on file  Physical Activity: Not on file  Stress: Not on file  Social Connections: Not on file  Intimate Partner Violence: Not on file    Outpatient Medications Prior to Visit  Medication Sig Dispense Refill   aspirin EC 325 MG tablet Take 325 mg by mouth daily.     atorvastatin (LIPITOR) 20 MG tablet TAKE 1 TABLET AT BEDTIME (Patient taking  differently: Take 20 mg by mouth daily.) 90 tablet 3   CRANBERRY EXTRACT PO Take 4,200 mg by mouth daily.     diazepam (VALIUM) 10 MG tablet Take 10 mg by mouth every 6 (six) hours as needed for anxiety.     Multiple Vitamin (MULTI VITAMIN MENS PO) Take 1 tablet by mouth daily.     Omega-3 Fatty Acids (FISH OIL) 1200 MG CAPS Take 1,200 mg by mouth daily.     cephALEXin (KEFLEX) 500 MG capsule Take 1 capsule (500 mg total) by mouth 4 (four) times daily. 20 capsule 0   No facility-administered medications prior to visit.    Allergies  Allergen Reactions   Bactrim Rash    Skin sloughing    Ropinirole Other (See Comments)    Increased leg spasms/poor sleep.    Latex Rash    ROS Review of Systems    Objective:    Physical Exam Vitals reviewed.  Constitutional:      Appearance: He is well-developed.  HENT:     Head: Normocephalic and atraumatic.  Eyes:     Conjunctiva/sclera: Conjunctivae normal.  Cardiovascular:     Rate and Rhythm: Normal rate.  Pulmonary:     Effort: Pulmonary effort is normal.  Genitourinary:      Comments: Her open wound is approximately 2 x 2 cm.  Using a sterile Q-tip going laterally approximately 5 cm deep.  And going more caudal was approximately 4 cm deep.mostly seround and bloody drainage.   There is a 1 cm horizontal lesion that looks like it has healed over. Under neath the are is a harder ore firm area. No pus or foul odor.   Skin:    General: Skin is dry.     Coloration: Skin is not pale.  Neurological:     Mental Status: He is alert and oriented to person, place, and time.  Psychiatric:        Behavior: Behavior normal.    BP 116/60   Pulse 78   Temp 97.6 F (36.4 C)   Ht '5\' 6"'  (1.676 m)   Wt 149 lb (67.6 kg)   SpO2 96%   BMI 24.05 kg/m  Wt Readings from Last 3 Encounters:  06/19/21 149 lb (67.6 kg)  06/12/21 150 lb (68 kg)  05/15/21 170 lb (77.1 kg)     Health Maintenance Due  Topic Date Due   COVID-19 Vaccine (4 -  Booster for Pfizer series) 10/09/2020    There are no preventive care reminders to display for this patient.  Lab Results  Component Value Date  TSH 3.422 04/25/2021   Lab Results  Component Value Date   WBC 13.6 (H) 06/12/2021   HGB 9.0 (L) 06/12/2021   HCT 29.6 (L) 06/12/2021   MCV 82.7 06/12/2021   PLT 488 (H) 06/12/2021   Lab Results  Component Value Date   NA 131 (L) 06/12/2021   K 4.2 06/12/2021   CO2 21 (L) 06/12/2021   GLUCOSE 215 (H) 06/12/2021   BUN 30 (H) 06/12/2021   CREATININE 1.66 (H) 06/12/2021   BILITOT 0.4 05/15/2021   ALKPHOS 339 (H) 05/15/2021   AST 17 05/15/2021   ALT 41 05/15/2021   PROT 6.6 05/15/2021   ALBUMIN 2.5 (L) 05/15/2021   CALCIUM 11.5 (H) 06/12/2021   ANIONGAP 10 06/12/2021   EGFR 47 (L) 04/16/2021   Lab Results  Component Value Date   CHOL 104 12/05/2020   Lab Results  Component Value Date   HDL 28 (L) 12/05/2020   Lab Results  Component Value Date   LDLCALC 58 12/05/2020   Lab Results  Component Value Date   TRIG 99 12/05/2020   Lab Results  Component Value Date   CHOLHDL 3.7 12/05/2020   Lab Results  Component Value Date   HGBA1C 5.9 (H) 05/07/2021      Assessment & Plan:   Problem List Items Addressed This Visit       Musculoskeletal and Integument   Perineal ulcer, limited to breakdown of skin ([place])   Relevant Orders   Wound culture   Other Visit Diagnoses     Perianal ulcer, with necrosis of muscle ([place])    -  Primary   Relevant Orders   Ambulatory referral to [place]   Need for immunization against influenza       Relevant Orders   Flu Vaccine QUAD 86moIM (Fluarix, Fluzone & Alfiuria Quad PF) (Completed)       Perineal tunneling wound - for now we will continue with the Keflex, he should run out tomorrow morning.  We did go ahead and do a wound culture today so we will call with those results once available.  He says his big goal is to get to his urology surgery on the 19th and as long as  there is no infection that has spread into the skin or either become septic and they are willing to do the surgery most likely.  So would like to keep him on some form of antibiotic until then.  In the meantime we will work on getting wound care out to the home.  Unfortunately there is a significant sPensions consultant  But we will do our best.  His wife who works from home health agency [facility]  We will work on that ASAP.  If we cannot get him in then I can contact one of our wound care nurses at the hospital to see if they have any recommendations for wound care that his wife Mabel to do at home.  Getting the supplies may be an issue as well but we will absolutely do our best.  In the short-term recommending saline gauze packed into the wound with a dry bandage on top.  We did put some Xeroform on top of the surface wound after packing today.  Meds ordered this encounter  Medications   cephALEXin (KEFLEX) 500 MG capsule    Sig: Take 1 capsule (500 mg total) by mouth 4 (four) times daily.    Dispense:  20 capsule    Refill:  0  Follow-up: No follow-ups on file.  I spent 45 minutes on the day of the encounter to include pre-visit record review, face-to-face time with the patient and post visit ordering of test.    [name], MD

## 2021-06-20 DIAGNOSIS — Z466 Encounter for fitting and adjustment of urinary device: Secondary | ICD-10-CM | POA: Diagnosis not present

## 2021-06-20 DIAGNOSIS — E785 Hyperlipidemia, unspecified: Secondary | ICD-10-CM | POA: Diagnosis not present

## 2021-06-20 DIAGNOSIS — L98493 Non-pressure chronic ulcer of skin of other sites with necrosis of muscle: Secondary | ICD-10-CM | POA: Diagnosis not present

## 2021-06-20 DIAGNOSIS — N319 Neuromuscular dysfunction of bladder, unspecified: Secondary | ICD-10-CM | POA: Diagnosis not present

## 2021-06-20 DIAGNOSIS — F419 Anxiety disorder, unspecified: Secondary | ICD-10-CM | POA: Diagnosis not present

## 2021-06-20 DIAGNOSIS — N1831 Chronic kidney disease, stage 3a: Secondary | ICD-10-CM | POA: Diagnosis not present

## 2021-06-20 DIAGNOSIS — D631 Anemia in chronic kidney disease: Secondary | ICD-10-CM | POA: Diagnosis not present

## 2021-06-20 DIAGNOSIS — C679 Malignant neoplasm of bladder, unspecified: Secondary | ICD-10-CM | POA: Diagnosis not present

## 2021-06-20 DIAGNOSIS — Z7982 Long term (current) use of aspirin: Secondary | ICD-10-CM | POA: Diagnosis not present

## 2021-06-22 ENCOUNTER — Encounter: Payer: Self-pay | Admitting: Family Medicine

## 2021-06-22 ENCOUNTER — Telehealth: Payer: Self-pay | Admitting: *Deleted

## 2021-06-22 LAB — WOUND CULTURE
MICRO NUMBER:: 12462118
SPECIMEN QUALITY:: ADEQUATE

## 2021-06-22 MED ORDER — CIPROFLOXACIN HCL 500 MG PO TABS: 500.0000 mg | ORAL_TABLET | Freq: Two times a day (BID) | ORAL | 0 refills | Status: DC

## 2021-06-22 NOTE — Telephone Encounter (Signed)
Called to let us now that they are going out to start his wound care.   They will send the orders via fax.

## 2021-06-22 NOTE — Progress Notes (Signed)
HI Thomas Schroeder, thank you for the note.  The final culture just came back around lunchtime today.  It came back Pseudomonas which makes sense with the color of the drainage that you are seeing.  It looks like organ to have to do Cipro which is a f flora quinolone I will send this over to your pharmacy.  Let me know if you have any problems picking it up.

## 2021-06-22 NOTE — Addendum Note (Signed)
Addended by: [name] D on: 06/22/2021 12:33 PM   Modules accepted: Orders

## 2021-06-25 ENCOUNTER — Other Ambulatory Visit (HOSPITAL_COMMUNITY): Payer: BC Managed Care – PPO

## 2021-06-25 DIAGNOSIS — L98493 Non-pressure chronic ulcer of skin of other sites with necrosis of muscle: Secondary | ICD-10-CM | POA: Diagnosis not present

## 2021-06-25 DIAGNOSIS — N183 Chronic kidney disease, stage 3 unspecified: Secondary | ICD-10-CM | POA: Diagnosis not present

## 2021-06-25 DIAGNOSIS — C775 Secondary and unspecified malignant neoplasm of intrapelvic lymph nodes: Secondary | ICD-10-CM | POA: Diagnosis not present

## 2021-06-25 DIAGNOSIS — N261 Atrophy of kidney (terminal): Secondary | ICD-10-CM | POA: Diagnosis not present

## 2021-06-25 DIAGNOSIS — C678 Malignant neoplasm of overlapping sites of bladder: Secondary | ICD-10-CM | POA: Diagnosis not present

## 2021-06-26 ENCOUNTER — Encounter: Payer: Self-pay | Admitting: Family Medicine

## 2021-06-26 DIAGNOSIS — N319 Neuromuscular dysfunction of bladder, unspecified: Secondary | ICD-10-CM | POA: Diagnosis not present

## 2021-06-26 DIAGNOSIS — Z466 Encounter for fitting and adjustment of urinary device: Secondary | ICD-10-CM | POA: Diagnosis not present

## 2021-06-26 DIAGNOSIS — F419 Anxiety disorder, unspecified: Secondary | ICD-10-CM | POA: Diagnosis not present

## 2021-06-26 DIAGNOSIS — L98493 Non-pressure chronic ulcer of skin of other sites with necrosis of muscle: Secondary | ICD-10-CM | POA: Diagnosis not present

## 2021-06-26 DIAGNOSIS — Z7982 Long term (current) use of aspirin: Secondary | ICD-10-CM | POA: Diagnosis not present

## 2021-06-26 DIAGNOSIS — N1831 Chronic kidney disease, stage 3a: Secondary | ICD-10-CM | POA: Diagnosis not present

## 2021-06-26 DIAGNOSIS — C679 Malignant neoplasm of bladder, unspecified: Secondary | ICD-10-CM | POA: Diagnosis not present

## 2021-06-26 DIAGNOSIS — D631 Anemia in chronic kidney disease: Secondary | ICD-10-CM | POA: Diagnosis not present

## 2021-06-26 DIAGNOSIS — E785 Hyperlipidemia, unspecified: Secondary | ICD-10-CM | POA: Diagnosis not present

## 2021-06-27 DIAGNOSIS — L98493 Non-pressure chronic ulcer of skin of other sites with necrosis of muscle: Secondary | ICD-10-CM | POA: Diagnosis not present

## 2021-06-27 NOTE — Telephone Encounter (Signed)
Hi Cindy, you can you reach out to his home health company to see if we can order a wound VAC through them.  I guess the wound care nurse has been coming out from there had made a recommendation for a wound VAC so I think I just need to provide an order and they would be able to supply it and apply it and use it but I just want a verify that for go through the whole process.

## 2021-06-27 NOTE — Telephone Encounter (Signed)
Dr Madilyn Fireman    I called Baxter Flattery with [facility] and she said we just need to put the order in for the wound vac and all the specifications and they could order it.   Jenny Reichmann

## 2021-06-29 DIAGNOSIS — N1831 Chronic kidney disease, stage 3a: Secondary | ICD-10-CM | POA: Diagnosis not present

## 2021-06-29 DIAGNOSIS — D631 Anemia in chronic kidney disease: Secondary | ICD-10-CM | POA: Diagnosis not present

## 2021-06-29 DIAGNOSIS — Z466 Encounter for fitting and adjustment of urinary device: Secondary | ICD-10-CM | POA: Diagnosis not present

## 2021-06-29 DIAGNOSIS — F419 Anxiety disorder, unspecified: Secondary | ICD-10-CM | POA: Diagnosis not present

## 2021-06-29 DIAGNOSIS — L98493 Non-pressure chronic ulcer of skin of other sites with necrosis of muscle: Secondary | ICD-10-CM | POA: Diagnosis not present

## 2021-06-29 DIAGNOSIS — E785 Hyperlipidemia, unspecified: Secondary | ICD-10-CM | POA: Diagnosis not present

## 2021-06-29 DIAGNOSIS — N319 Neuromuscular dysfunction of bladder, unspecified: Secondary | ICD-10-CM | POA: Diagnosis not present

## 2021-06-29 DIAGNOSIS — Z7982 Long term (current) use of aspirin: Secondary | ICD-10-CM | POA: Diagnosis not present

## 2021-06-29 DIAGNOSIS — C679 Malignant neoplasm of bladder, unspecified: Secondary | ICD-10-CM | POA: Diagnosis not present

## 2021-06-29 NOTE — Telephone Encounter (Signed)
Order printed. Pls fax, etc. If the wound care nurse veels we need to adjust the pressure please let me know

## 2021-07-02 DIAGNOSIS — Z466 Encounter for fitting and adjustment of urinary device: Secondary | ICD-10-CM | POA: Diagnosis not present

## 2021-07-02 DIAGNOSIS — Z7982 Long term (current) use of aspirin: Secondary | ICD-10-CM | POA: Diagnosis not present

## 2021-07-02 DIAGNOSIS — N1831 Chronic kidney disease, stage 3a: Secondary | ICD-10-CM | POA: Diagnosis not present

## 2021-07-02 DIAGNOSIS — E785 Hyperlipidemia, unspecified: Secondary | ICD-10-CM | POA: Diagnosis not present

## 2021-07-02 DIAGNOSIS — N319 Neuromuscular dysfunction of bladder, unspecified: Secondary | ICD-10-CM | POA: Diagnosis not present

## 2021-07-02 DIAGNOSIS — F419 Anxiety disorder, unspecified: Secondary | ICD-10-CM | POA: Diagnosis not present

## 2021-07-02 DIAGNOSIS — D631 Anemia in chronic kidney disease: Secondary | ICD-10-CM | POA: Diagnosis not present

## 2021-07-02 DIAGNOSIS — L98493 Non-pressure chronic ulcer of skin of other sites with necrosis of muscle: Secondary | ICD-10-CM | POA: Diagnosis not present

## 2021-07-02 DIAGNOSIS — C679 Malignant neoplasm of bladder, unspecified: Secondary | ICD-10-CM | POA: Diagnosis not present

## 2021-07-03 ENCOUNTER — Encounter (HOSPITAL_COMMUNITY): Payer: Self-pay | Admitting: Urology

## 2021-07-03 ENCOUNTER — Other Ambulatory Visit: Payer: Self-pay

## 2021-07-03 ENCOUNTER — Inpatient Hospital Stay (HOSPITAL_COMMUNITY): Payer: BC Managed Care – PPO

## 2021-07-03 ENCOUNTER — Inpatient Hospital Stay (HOSPITAL_COMMUNITY)
Admission: RE | Admit: 2021-07-03 | Discharge: 2021-07-06 | DRG: 656 | Disposition: A | Discharge status: Home / Self Care | Payer: BC Managed Care – PPO | Attending: Urology | Admitting: Urology

## 2021-07-03 DIAGNOSIS — Z8349 Family history of other endocrine, nutritional and metabolic diseases: Secondary | ICD-10-CM | POA: Diagnosis not present

## 2021-07-03 DIAGNOSIS — Z993 Dependence on wheelchair: Secondary | ICD-10-CM

## 2021-07-03 DIAGNOSIS — L03311 Cellulitis of abdominal wall: Secondary | ICD-10-CM | POA: Diagnosis not present

## 2021-07-03 DIAGNOSIS — Z20822 Contact with and (suspected) exposure to covid-19: Secondary | ICD-10-CM | POA: Diagnosis not present

## 2021-07-03 DIAGNOSIS — Z01818 Encounter for other preprocedural examination: Secondary | ICD-10-CM | POA: Diagnosis not present

## 2021-07-03 DIAGNOSIS — K660 Peritoneal adhesions (postprocedural) (postinfection): Secondary | ICD-10-CM | POA: Diagnosis not present

## 2021-07-03 DIAGNOSIS — Z91040 Latex allergy status: Secondary | ICD-10-CM

## 2021-07-03 DIAGNOSIS — Z801 Family history of malignant neoplasm of trachea, bronchus and lung: Secondary | ICD-10-CM

## 2021-07-03 DIAGNOSIS — C785 Secondary malignant neoplasm of large intestine and rectum: Secondary | ICD-10-CM | POA: Diagnosis not present

## 2021-07-03 DIAGNOSIS — N183 Chronic kidney disease, stage 3 unspecified: Secondary | ICD-10-CM | POA: Diagnosis not present

## 2021-07-03 DIAGNOSIS — Z882 Allergy status to sulfonamides status: Secondary | ICD-10-CM

## 2021-07-03 DIAGNOSIS — D649 Anemia, unspecified: Secondary | ICD-10-CM | POA: Diagnosis not present

## 2021-07-03 DIAGNOSIS — C679 Malignant neoplasm of bladder, unspecified: Secondary | ICD-10-CM | POA: Diagnosis not present

## 2021-07-03 DIAGNOSIS — Y838 Other surgical procedures as the cause of abnormal reaction of the patient, or of later complication, without mention of misadventure at the time of the procedure: Secondary | ICD-10-CM | POA: Diagnosis not present

## 2021-07-03 DIAGNOSIS — E785 Hyperlipidemia, unspecified: Secondary | ICD-10-CM | POA: Diagnosis not present

## 2021-07-03 DIAGNOSIS — Z79899 Other long term (current) drug therapy: Secondary | ICD-10-CM

## 2021-07-03 DIAGNOSIS — G8220 Paraplegia, unspecified: Secondary | ICD-10-CM | POA: Diagnosis not present

## 2021-07-03 DIAGNOSIS — X58XXXS Exposure to other specified factors, sequela: Secondary | ICD-10-CM | POA: Diagnosis present

## 2021-07-03 DIAGNOSIS — C7989 Secondary malignant neoplasm of other specified sites: Secondary | ICD-10-CM | POA: Diagnosis not present

## 2021-07-03 DIAGNOSIS — Z888 Allergy status to other drugs, medicaments and biological substances status: Secondary | ICD-10-CM

## 2021-07-03 DIAGNOSIS — Z7982 Long term (current) use of aspirin: Secondary | ICD-10-CM

## 2021-07-03 DIAGNOSIS — L98491 Non-pressure chronic ulcer of skin of other sites limited to breakdown of skin: Secondary | ICD-10-CM

## 2021-07-03 DIAGNOSIS — R8271 Bacteriuria: Secondary | ICD-10-CM | POA: Diagnosis not present

## 2021-07-03 DIAGNOSIS — L905 Scar conditions and fibrosis of skin: Secondary | ICD-10-CM | POA: Diagnosis not present

## 2021-07-03 DIAGNOSIS — Z833 Family history of diabetes mellitus: Secondary | ICD-10-CM | POA: Diagnosis not present

## 2021-07-03 DIAGNOSIS — L89154 Pressure ulcer of sacral region, stage 4: Secondary | ICD-10-CM | POA: Diagnosis not present

## 2021-07-03 DIAGNOSIS — L7682 Other postprocedural complications of skin and subcutaneous tissue: Secondary | ICD-10-CM | POA: Diagnosis not present

## 2021-07-03 DIAGNOSIS — G9589 Other specified diseases of spinal cord: Secondary | ICD-10-CM | POA: Diagnosis present

## 2021-07-03 DIAGNOSIS — Z936 Other artificial openings of urinary tract status: Secondary | ICD-10-CM

## 2021-07-03 DIAGNOSIS — C784 Secondary malignant neoplasm of small intestine: Secondary | ICD-10-CM | POA: Diagnosis present

## 2021-07-03 DIAGNOSIS — S24103S Unspecified injury at T7-T10 level of thoracic spinal cord, sequela: Secondary | ICD-10-CM | POA: Diagnosis not present

## 2021-07-03 DIAGNOSIS — N136 Pyonephrosis: Secondary | ICD-10-CM | POA: Diagnosis not present

## 2021-07-03 DIAGNOSIS — N119 Chronic tubulo-interstitial nephritis, unspecified: Secondary | ICD-10-CM | POA: Diagnosis not present

## 2021-07-03 DIAGNOSIS — N135 Crossing vessel and stricture of ureter without hydronephrosis: Secondary | ICD-10-CM | POA: Diagnosis present

## 2021-07-03 DIAGNOSIS — N186 End stage renal disease: Secondary | ICD-10-CM | POA: Diagnosis not present

## 2021-07-03 DIAGNOSIS — T8131XA Disruption of external operation (surgical) wound, not elsewhere classified, initial encounter: Secondary | ICD-10-CM | POA: Diagnosis not present

## 2021-07-03 DIAGNOSIS — N261 Atrophy of kidney (terminal): Secondary | ICD-10-CM | POA: Diagnosis not present

## 2021-07-03 LAB — SARS CORONAVIRUS 2 BY RT PCR (HOSPITAL ORDER, PERFORMED IN ~~LOC~~ HOSPITAL LAB): SARS Coronavirus 2: NEGATIVE

## 2021-07-03 LAB — CBC
HCT: 33.1 % — ABNORMAL LOW (ref 39.0–52.0)
Hemoglobin: 9.7 g/dL — ABNORMAL LOW (ref 13.0–17.0)
MCH: 25.1 pg — ABNORMAL LOW (ref 26.0–34.0)
MCHC: 29.3 g/dL — ABNORMAL LOW (ref 30.0–36.0)
MCV: 85.5 fL (ref 80.0–100.0)
Platelets: 381 10*3/uL (ref 150–400)
RBC: 3.87 MIL/uL — ABNORMAL LOW (ref 4.22–5.81)
RDW: 16.3 % — ABNORMAL HIGH (ref 11.5–15.5)
WBC: 10.3 10*3/uL (ref 4.0–10.5)
nRBC: 0 % (ref 0.0–0.2)

## 2021-07-03 LAB — COMPREHENSIVE METABOLIC PANEL
ALT: 35 U/L (ref 0–44)
AST: 22 U/L (ref 15–41)
Albumin: 3.1 g/dL — ABNORMAL LOW (ref 3.5–5.0)
Alkaline Phosphatase: 184 U/L — ABNORMAL HIGH (ref 38–126)
Anion gap: 7 (ref 5–15)
BUN: 27 mg/dL — ABNORMAL HIGH (ref 8–23)
CO2: 23 mmol/L (ref 22–32)
Calcium: 12 mg/dL — ABNORMAL HIGH (ref 8.9–10.3)
Chloride: 106 mmol/L (ref 98–111)
Creatinine, Ser: 1.53 mg/dL — ABNORMAL HIGH (ref 0.61–1.24)
GFR, Estimated: 51 mL/min — ABNORMAL LOW (ref >60)
Glucose, Bld: 100 mg/dL — ABNORMAL HIGH (ref 70–99)
Potassium: 3.8 mmol/L (ref 3.5–5.1)
Sodium: 136 mmol/L (ref 135–145)
Total Bilirubin: 0.4 mg/dL (ref 0.3–1.2)
Total Protein: 7.5 g/dL (ref 6.5–8.1)

## 2021-07-03 LAB — SURGICAL PCR SCREEN
MRSA, PCR: NEGATIVE
Staphylococcus aureus: NEGATIVE

## 2021-07-03 LAB — PREPARE RBC (CROSSMATCH)

## 2021-07-03 MED ORDER — CHLORHEXIDINE GLUCONATE CLOTH 2 % EX PADS
6.0000 | MEDICATED_PAD | CUTANEOUS | Status: DC
  Administered 2021-07-03: 6 via TOPICAL

## 2021-07-03 MED ORDER — ATORVASTATIN CALCIUM 20 MG PO TABS
20.0000 mg | ORAL_TABLET | Freq: Every day | ORAL | Status: DC
  Administered 2021-07-05 – 2021-07-06 (×2): 20 mg via ORAL
  Filled 2021-07-03 (×2): qty 1

## 2021-07-03 MED ORDER — ALVIMOPAN 12 MG PO CAPS
12.0000 mg | ORAL_CAPSULE | ORAL | Status: AC
  Administered 2021-07-04: 12 mg via ORAL
  Filled 2021-07-03: qty 1

## 2021-07-03 MED ORDER — DIAZEPAM 5 MG PO TABS: 5.0000 mg | ORAL_TABLET | Freq: Three times a day (TID) | ORAL | Status: DC | PRN

## 2021-07-03 MED ORDER — PEG 3350-KCL-NA BICARB-NACL 420 G PO SOLR
4000.0000 mL | Freq: Once | ORAL | Status: AC
  Administered 2021-07-03: 4000 mL via ORAL

## 2021-07-03 MED ORDER — PIPERACILLIN-TAZOBACTAM IN DEX 2-0.25 GM/50ML IV SOLN
2.2500 g | INTRAVENOUS | Status: AC
  Administered 2021-07-04: 3.375 g via INTRAVENOUS
  Filled 2021-07-03 (×2): qty 50

## 2021-07-03 MED ORDER — CHLORHEXIDINE GLUCONATE CLOTH 2 % EX PADS
6.0000 | MEDICATED_PAD | Freq: Every day | CUTANEOUS | Status: DC
  Administered 2021-07-04 – 2021-07-05 (×2): 6 via TOPICAL

## 2021-07-03 MED ORDER — NEOMYCIN SULFATE 500 MG PO TABS
500.0000 mg | ORAL_TABLET | ORAL | Status: AC
Start: 2021-07-03 — End: 2021-07-03
  Administered 2021-07-03 (×2): 500 mg via ORAL
  Filled 2021-07-03 (×2): qty 1

## 2021-07-03 MED ORDER — METRONIDAZOLE 500 MG PO TABS
500.0000 mg | ORAL_TABLET | ORAL | Status: AC
Start: 2021-07-03 — End: 2021-07-03
  Administered 2021-07-03 (×2): 500 mg via ORAL
  Filled 2021-07-03 (×2): qty 1

## 2021-07-03 NOTE — Consult Note (Addendum)
[place] Nurse requested for preoperative stoma site marking  Discussed surgical procedure and stoma creation with patient and wife.  Explained role of the [place] nurse team.  Provided the patient with educational booklet and provided samples of pouching options.  Answered patient and wife's questions.   Examined patient sitting upright in his wheelchair, and leaning down and forward, in order to place the marking in the patient's visual field, away from any creases or abdominal contour issues and within the rectus muscle.  Unable to mark below the patient's belt line, since a significant crease is located lower on the abd. I also marked higher than usual for the patient to be able to see adequately and avoid creases which occur when he leans forward in the wheelchair and which should be avoided if possible.    Marked for ileal conduit in the RLQ __8__  cm to the right of the umbilicus and  __9__ cm above the umbilicus.  Patient's abdomen cleansed with CHG wipes at site markings, allowed to air dry prior to marking. Plans to have surgery performed tomorrow.  [facility] Nurse team will follow up with patient for continued ostomy care and teaching on Thursday after surgery. Wife states she will be present that morning. Julien Girt MSN, RN, [place], [place], [place]

## 2021-07-03 NOTE — Anesthesia Preprocedure Evaluation (Addendum)
Anesthesia Evaluation  Patient identified by MRN, date of birth, ID band Patient awake    Reviewed: Allergy & Precautions, NPO status , Patient's Chart, lab work & pertinent test results  History of Anesthesia Complications Negative for: history of anesthetic complications  Airway Mallampati: II  TM Distance: >3 FB Neck ROM: Full    Dental no notable dental hx.    Pulmonary neg pulmonary ROS,    Pulmonary exam normal        Cardiovascular negative cardio ROS Normal cardiovascular exam     Neuro/Psych Anxiety Paraplegia s/p T8/9 inury in 1999, wheelchair bound negative neurological ROS     GI/Hepatic negative GI ROS, Neg liver ROS,   Endo/Other  negative endocrine ROS  Renal/GU Renal InsufficiencyRenal disease  negative genitourinary   Musculoskeletal negative musculoskeletal ROS (+)   Abdominal Normal abdominal exam  (+)   Peds  Hematology  (+) anemia ,   Anesthesia Other Findings  1. Left ventricular ejection fraction, by estimation, is 60 to 65%. The  left ventricle has normal function. The left ventricle has no regional  wall motion abnormalities. Left ventricular diastolic parameters were  normal.  2. Right ventricular systolic function is normal. The right ventricular  size is normal. There is normal pulmonary artery systolic pressure.  3. The mitral valve is normal in structure. Trivial mitral valve  regurgitation.  4. The aortic valve is normal in structure. Aortic valve regurgitation is  not visualized. No aortic stenosis is present.   Reproductive/Obstetrics negative OB ROS                           Anesthesia Physical  Anesthesia Plan  ASA: 3  Anesthesia Plan: General   Post-op Pain Management:    Induction: Intravenous  PONV Risk Score and Plan: 4 or greater and Treatment may vary due to age or medical condition, Midazolam, Dexamethasone and  Ondansetron  Airway Management Planned: Oral ETT  Additional Equipment: Arterial line  Intra-op Plan:   Post-operative Plan: Extubation in OR  Informed Consent: I have reviewed the patients History and Physical, chart, labs and discussed the procedure including the risks, benefits and alternatives for the proposed anesthesia with the patient or authorized representative who has indicated his/her understanding and acceptance.     Dental advisory given  Plan Discussed with: CRNA  Anesthesia Plan Comments: (2 PIV and A-line)       Anesthesia Quick Evaluation

## 2021-07-04 ENCOUNTER — Encounter (HOSPITAL_COMMUNITY): Payer: Self-pay | Admitting: Urology

## 2021-07-04 ENCOUNTER — Inpatient Hospital Stay (HOSPITAL_COMMUNITY): Payer: BC Managed Care – PPO | Admitting: Certified Registered"

## 2021-07-04 ENCOUNTER — Encounter (HOSPITAL_COMMUNITY)
Admission: RE | Disposition: A | Discharge status: Home / Self Care | Payer: Self-pay | Source: Home / Self Care | Attending: Urology

## 2021-07-04 DIAGNOSIS — C679 Malignant neoplasm of bladder, unspecified: Secondary | ICD-10-CM | POA: Diagnosis present

## 2021-07-04 HISTORY — PX: ROBOTIC ASSISTED LAPAROSCOPIC LYSIS OF ADHESION: SHX6080

## 2021-07-04 HISTORY — PX: CYSTOSCOPY WITH INJECTION: SHX1424

## 2021-07-04 HISTORY — PX: ROBOT ASSITED LAPAROSCOPIC NEPHROURETERECTOMY: SHX6077

## 2021-07-04 LAB — HEMOGLOBIN AND HEMATOCRIT, BLOOD
HCT: 29.4 % — ABNORMAL LOW (ref 39.0–52.0)
Hemoglobin: 8.9 g/dL — ABNORMAL LOW (ref 13.0–17.0)

## 2021-07-04 SURGERY — NEPHROURETERECTOMY, ROBOT-ASSISTED, LAPAROSCOPIC
Anesthesia: General | Laterality: Right

## 2021-07-04 MED ORDER — SUGAMMADEX SODIUM 200 MG/2ML IV SOLN
INTRAVENOUS | Status: DC | PRN
  Administered 2021-07-04: 175 mg via INTRAVENOUS

## 2021-07-04 MED ORDER — LACTATED RINGERS IV SOLN: INTRAVENOUS | Status: DC | PRN

## 2021-07-04 MED ORDER — LIDOCAINE HCL 2 % IJ SOLN
INTRAMUSCULAR | Status: AC
  Filled 2021-07-04: qty 20

## 2021-07-04 MED ORDER — PROPOFOL 10 MG/ML IV BOLUS
INTRAVENOUS | Status: DC | PRN
  Administered 2021-07-04: 200 mg via INTRAVENOUS

## 2021-07-04 MED ORDER — SODIUM CHLORIDE (PF) 0.9 % IJ SOLN
INTRAMUSCULAR | Status: AC
  Filled 2021-07-04: qty 20

## 2021-07-04 MED ORDER — ROCURONIUM BROMIDE 10 MG/ML (PF) SYRINGE
PREFILLED_SYRINGE | INTRAVENOUS | Status: DC | PRN
  Administered 2021-07-04 (×2): 20 mg via INTRAVENOUS
  Administered 2021-07-04: 80 mg via INTRAVENOUS

## 2021-07-04 MED ORDER — ONDANSETRON HCL 4 MG/2ML IJ SOLN
INTRAMUSCULAR | Status: DC | PRN
  Administered 2021-07-04: 4 mg via INTRAVENOUS

## 2021-07-04 MED ORDER — LACTATED RINGERS IR SOLN
Status: DC | PRN
  Administered 2021-07-04: 1000 mL

## 2021-07-04 MED ORDER — LIDOCAINE 20MG/ML (2%) 15 ML SYRINGE OPTIME
INTRAMUSCULAR | Status: DC | PRN
  Administered 2021-07-04: 1.5 mg/kg/h via INTRAVENOUS

## 2021-07-04 MED ORDER — STERILE WATER FOR IRRIGATION IR SOLN
Status: DC | PRN
  Administered 2021-07-04: 3000 mL

## 2021-07-04 MED ORDER — DIPHENHYDRAMINE HCL 50 MG/ML IJ SOLN: 12.5000 mg | Freq: Four times a day (QID) | INTRAMUSCULAR | Status: DC | PRN

## 2021-07-04 MED ORDER — BUPIVACAINE LIPOSOME 1.3 % IJ SUSP
INTRAMUSCULAR | Status: DC | PRN
  Administered 2021-07-04: 20 mL

## 2021-07-04 MED ORDER — DEXAMETHASONE SODIUM PHOSPHATE 10 MG/ML IJ SOLN
INTRAMUSCULAR | Status: AC
  Filled 2021-07-04: qty 1

## 2021-07-04 MED ORDER — ALBUMIN HUMAN 5 % IV SOLN: INTRAVENOUS | Status: DC | PRN

## 2021-07-04 MED ORDER — PROPOFOL 10 MG/ML IV BOLUS
INTRAVENOUS | Status: AC
  Filled 2021-07-04: qty 40

## 2021-07-04 MED ORDER — HYDROMORPHONE HCL 1 MG/ML IJ SOLN
INTRAMUSCULAR | Status: AC
  Administered 2021-07-04: 0.25 mg via INTRAVENOUS
  Filled 2021-07-04: qty 1

## 2021-07-04 MED ORDER — KETAMINE HCL 10 MG/ML IJ SOLN
INTRAMUSCULAR | Status: AC
  Filled 2021-07-04: qty 1

## 2021-07-04 MED ORDER — ONDANSETRON HCL 4 MG/2ML IJ SOLN
INTRAMUSCULAR | Status: AC
  Filled 2021-07-04: qty 2

## 2021-07-04 MED ORDER — HYDROMORPHONE HCL 1 MG/ML IJ SOLN
0.5000 mg | INTRAMUSCULAR | Status: DC | PRN
  Administered 2021-07-04 – 2021-07-06 (×7): 1 mg via INTRAVENOUS
  Filled 2021-07-04 (×7): qty 1

## 2021-07-04 MED ORDER — MEPERIDINE HCL 50 MG/ML IJ SOLN: 6.2500 mg | INTRAMUSCULAR | Status: DC | PRN

## 2021-07-04 MED ORDER — ACETAMINOPHEN 500 MG PO TABS
1000.0000 mg | ORAL_TABLET | Freq: Four times a day (QID) | ORAL | Status: AC
  Administered 2021-07-04 – 2021-07-05 (×4): 1000 mg via ORAL
  Filled 2021-07-04 (×4): qty 2

## 2021-07-04 MED ORDER — BISACODYL 10 MG RE SUPP
10.0000 mg | RECTAL | Status: AC
  Administered 2021-07-04: 10 mg via RECTAL
  Filled 2021-07-04: qty 1

## 2021-07-04 MED ORDER — SODIUM CHLORIDE (PF) 0.9 % IJ SOLN
INTRAMUSCULAR | Status: DC | PRN
  Administered 2021-07-04: 20 mL

## 2021-07-04 MED ORDER — HYDROMORPHONE HCL 1 MG/ML IJ SOLN: 0.2500 mg | INTRAMUSCULAR | Status: DC | PRN

## 2021-07-04 MED ORDER — MIDAZOLAM HCL 2 MG/2ML IJ SOLN
INTRAMUSCULAR | Status: AC
  Filled 2021-07-04: qty 2

## 2021-07-04 MED ORDER — PROMETHAZINE HCL 25 MG/ML IJ SOLN: 6.2500 mg | INTRAMUSCULAR | Status: DC | PRN

## 2021-07-04 MED ORDER — DOCUSATE SODIUM 100 MG PO CAPS
100.0000 mg | ORAL_CAPSULE | Freq: Two times a day (BID) | ORAL | Status: DC
  Administered 2021-07-04 – 2021-07-06 (×4): 100 mg via ORAL
  Filled 2021-07-04 (×4): qty 1

## 2021-07-04 MED ORDER — LIDOCAINE HCL (PF) 2 % IJ SOLN
INTRAMUSCULAR | Status: AC
  Filled 2021-07-04: qty 5

## 2021-07-04 MED ORDER — ALBUMIN HUMAN 5 % IV SOLN
INTRAVENOUS | Status: AC
  Filled 2021-07-04: qty 250

## 2021-07-04 MED ORDER — BUPIVACAINE LIPOSOME 1.3 % IJ SUSP
INTRAMUSCULAR | Status: AC
  Filled 2021-07-04: qty 20

## 2021-07-04 MED ORDER — DEXAMETHASONE SODIUM PHOSPHATE 10 MG/ML IJ SOLN
INTRAMUSCULAR | Status: DC | PRN
  Administered 2021-07-04: 10 mg via INTRAVENOUS

## 2021-07-04 MED ORDER — ONDANSETRON HCL 4 MG/2ML IJ SOLN: 4.0000 mg | INTRAMUSCULAR | Status: DC | PRN

## 2021-07-04 MED ORDER — ACETAMINOPHEN 10 MG/ML IV SOLN: 1000.0000 mg | Freq: Once | INTRAVENOUS | Status: DC | PRN

## 2021-07-04 MED ORDER — LACTATED RINGERS IV SOLN
INTRAVENOUS | Status: DC | PRN
Start: 2021-07-04 — End: 2021-07-04

## 2021-07-04 MED ORDER — PHENYLEPHRINE HCL-NACL 20-0.9 MG/250ML-% IV SOLN
INTRAVENOUS | Status: DC | PRN
Start: 2021-07-04 — End: 2021-07-04
  Administered 2021-07-04: 45 ug/min via INTRAVENOUS

## 2021-07-04 MED ORDER — ROCURONIUM BROMIDE 10 MG/ML (PF) SYRINGE
PREFILLED_SYRINGE | INTRAVENOUS | Status: AC
  Filled 2021-07-04: qty 10

## 2021-07-04 MED ORDER — KETAMINE HCL 10 MG/ML IJ SOLN
INTRAMUSCULAR | Status: DC | PRN
  Administered 2021-07-04: 30 mg via INTRAVENOUS

## 2021-07-04 MED ORDER — LIDOCAINE 2% (20 MG/ML) 5 ML SYRINGE
INTRAMUSCULAR | Status: DC | PRN
  Administered 2021-07-04: 60 mg via INTRAVENOUS

## 2021-07-04 MED ORDER — PHENYLEPHRINE 40 MCG/ML (10ML) SYRINGE FOR IV PUSH (FOR BLOOD PRESSURE SUPPORT)
PREFILLED_SYRINGE | INTRAVENOUS | Status: DC | PRN
  Administered 2021-07-04 (×2): 80 ug via INTRAVENOUS

## 2021-07-04 MED ORDER — PHENYLEPHRINE 40 MCG/ML (10ML) SYRINGE FOR IV PUSH (FOR BLOOD PRESSURE SUPPORT)
PREFILLED_SYRINGE | INTRAVENOUS | Status: AC
  Filled 2021-07-04: qty 10

## 2021-07-04 MED ORDER — OXYCODONE HCL 5 MG PO TABS
5.0000 mg | ORAL_TABLET | ORAL | Status: DC | PRN
  Administered 2021-07-05 (×2): 5 mg via ORAL
  Filled 2021-07-04 (×2): qty 1

## 2021-07-04 MED ORDER — MIDAZOLAM HCL 5 MG/5ML IJ SOLN
INTRAMUSCULAR | Status: DC | PRN
  Administered 2021-07-04: 2 mg via INTRAVENOUS

## 2021-07-04 MED ORDER — FENTANYL CITRATE (PF) 250 MCG/5ML IJ SOLN
INTRAMUSCULAR | Status: DC | PRN
  Administered 2021-07-04 (×5): 50 ug via INTRAVENOUS

## 2021-07-04 MED ORDER — FENTANYL CITRATE (PF) 250 MCG/5ML IJ SOLN
INTRAMUSCULAR | Status: AC
  Filled 2021-07-04: qty 5

## 2021-07-04 MED ORDER — DIPHENHYDRAMINE HCL 12.5 MG/5ML PO ELIX: 12.5000 mg | ORAL_SOLUTION | Freq: Four times a day (QID) | ORAL | Status: DC | PRN

## 2021-07-04 MED ORDER — DEXTROSE-NACL 5-0.45 % IV SOLN: INTRAVENOUS | Status: DC

## 2021-07-04 SURGICAL SUPPLY — 123 items
APPLICATOR COTTON TIP 6 STRL (MISCELLANEOUS) ×5 IMPLANT
APPLICATOR COTTON TIP 6IN STRL (MISCELLANEOUS) ×6
APPLICATOR SURGIFLO ENDO (HEMOSTASIS) IMPLANT
BAG COUNTER SPONGE SURGICOUNT (BAG) IMPLANT
BAG LAPAROSCOPIC 12 15 PORT 16 (BASKET) ×10 IMPLANT
BAG RETRIEVAL 12/15 (BASKET) ×12
BAG URINE DRAIN 2000ML AR STRL (UROLOGICAL SUPPLIES) ×6 IMPLANT
BAG URO CATCHER STRL LF (MISCELLANEOUS) IMPLANT
BENZOIN TINCTURE PRP APPL 2/3 (GAUZE/BANDAGES/DRESSINGS) IMPLANT
BLADE SURG SZ10 CARB STEEL (BLADE) ×6 IMPLANT
CATH SILICONE 5CC 18FR (INSTRUMENTS) ×6 IMPLANT
CELLS DAT CNTRL 66122 CELL SVR (MISCELLANEOUS) ×5 IMPLANT
CHLORAPREP W/TINT 26 (MISCELLANEOUS) ×18 IMPLANT
CLIP LIGATING HEMO LOK XL GOLD (MISCELLANEOUS) ×12 IMPLANT
CLIP LIGATING HEMO O LOK GREEN (MISCELLANEOUS) ×18 IMPLANT
CLOTH BEACON ORANGE TIMEOUT ST (SAFETY) ×6 IMPLANT
CNTNR URN SCR LID CUP LEK RST (MISCELLANEOUS) ×5 IMPLANT
CONT SPEC 4OZ STRL OR WHT (MISCELLANEOUS) ×1
COVER SURGICAL LIGHT HANDLE (MISCELLANEOUS) ×6 IMPLANT
COVER TIP SHEARS 8 DVNC (MISCELLANEOUS) ×5 IMPLANT
COVER TIP SHEARS 8MM DA VINCI (MISCELLANEOUS) ×1
DECANTER SPIKE VIAL GLASS SM (MISCELLANEOUS) ×6 IMPLANT
DERMABOND ADVANCED (GAUZE/BANDAGES/DRESSINGS) ×1
DERMABOND ADVANCED .7 DNX12 (GAUZE/BANDAGES/DRESSINGS) ×5 IMPLANT
DRAIN CHANNEL 15F RND FF 3/16 (WOUND CARE) IMPLANT
DRAIN CHANNEL RND F F (WOUND CARE) ×6 IMPLANT
DRAIN PENROSE 0.5X18 (DRAIN) IMPLANT
DRAPE ARM DVNC X/XI (DISPOSABLE) ×20 IMPLANT
DRAPE COLUMN DVNC XI (DISPOSABLE) ×5 IMPLANT
DRAPE DA VINCI XI ARM (DISPOSABLE) ×4
DRAPE DA VINCI XI COLUMN (DISPOSABLE) ×1
DRAPE INCISE IOBAN 66X45 STRL (DRAPES) ×12 IMPLANT
DRAPE SHEET LG 3/4 BI-LAMINATE (DRAPES) ×12 IMPLANT
DRAPE SURG IRRIG POUCH 19X23 (DRAPES) ×6 IMPLANT
DRSG TEGADERM 4X4.75 (GAUZE/BANDAGES/DRESSINGS) ×6 IMPLANT
ELECT PENCIL ROCKER SW 15FT (MISCELLANEOUS) ×6 IMPLANT
ELECT REM PT RETURN 15FT ADLT (MISCELLANEOUS) ×12 IMPLANT
EVACUATOR SILICONE 100CC (DRAIN) ×6 IMPLANT
GAUZE 4X4 16PLY ~~LOC~~+RFID DBL (SPONGE) ×6 IMPLANT
GLOVE SURG NEOP MICRO LF SZ6.5 (GLOVE) ×6 IMPLANT
GLOVE SURG POLY ORTHO LF SZ6.5 (GLOVE) ×12 IMPLANT
GLOVE SURG POLYISO LF SZ7.5 (GLOVE) ×30 IMPLANT
GLOVE SURG UNDER POLY LF SZ6.5 (GLOVE) ×6 IMPLANT
GLOVE SURG UNDER POLY LF SZ7.5 (GLOVE) ×6 IMPLANT
GOWN STRL REUS W/TWL LRG LVL3 (GOWN DISPOSABLE) ×48 IMPLANT
IRRIG SUCT STRYKERFLOW 2 WTIP (MISCELLANEOUS) ×18
IRRIGATION SUCT STRKRFLW 2 WTP (MISCELLANEOUS) ×15 IMPLANT
KIT BASIN OR (CUSTOM PROCEDURE TRAY) ×6 IMPLANT
KIT PROCEDURE DA VINCI SI (MISCELLANEOUS) ×1
KIT PROCEDURE DVNC SI (MISCELLANEOUS) ×5 IMPLANT
LOOP VESSEL MAXI BLUE (MISCELLANEOUS) ×6 IMPLANT
MANIFOLD NEPTUNE II (INSTRUMENTS) ×6 IMPLANT
MARKER SKIN DUAL TIP RULER LAB (MISCELLANEOUS) ×6 IMPLANT
NEEDLE ASPIRATION 22 (NEEDLE) ×12 IMPLANT
NEEDLE INSUFFLATION 14GA 120MM (NEEDLE) ×6 IMPLANT
PACK CYSTO (CUSTOM PROCEDURE TRAY) IMPLANT
PACK ROBOT UROLOGY CUSTOM (CUSTOM PROCEDURE TRAY) ×6 IMPLANT
PAD POSITIONING PINK XL (MISCELLANEOUS) ×6 IMPLANT
PENCIL SMOKE EVACUATOR (MISCELLANEOUS) IMPLANT
PORT ACCESS TROCAR AIRSEAL 12 (TROCAR) ×5 IMPLANT
PORT ACCESS TROCAR AIRSEAL 5M (TROCAR) ×1
PROTECTOR NERVE ULNAR (MISCELLANEOUS) ×12 IMPLANT
RELOAD STAPLER GREEN 60MM (STAPLE) ×15 IMPLANT
RELOAD STAPLER WHITE 60MM (STAPLE) ×30 IMPLANT
RETRACTOR LONRSTAR 16.6X16.6CM (MISCELLANEOUS) IMPLANT
RETRACTOR STAY HOOK 5MM (MISCELLANEOUS) IMPLANT
RETRACTOR STER APS 16.6X16.6CM (MISCELLANEOUS)
RTRCTR WOUND ALEXIS 18CM MED (MISCELLANEOUS) ×6
SEAL CANN UNIV 5-8 DVNC XI (MISCELLANEOUS) ×20 IMPLANT
SEAL XI 5MM-8MM UNIVERSAL (MISCELLANEOUS) ×4
SET IRRIG Y TYPE TUR BLADDER L (SET/KITS/TRAYS/PACK) ×6 IMPLANT
SET TRI-LUMEN FLTR TB AIRSEAL (TUBING) ×6 IMPLANT
SOLUTION ELECTROLUBE (MISCELLANEOUS) ×6 IMPLANT
SPONGE T-LAP 18X18 ~~LOC~~+RFID (SPONGE) ×12 IMPLANT
SPONGE T-LAP 4X18 ~~LOC~~+RFID (SPONGE) ×6 IMPLANT
STAPLER ECHELON LONG 60 440 (INSTRUMENTS) ×6 IMPLANT
STAPLER RELOAD GREEN 60MM (STAPLE) ×18
STAPLER RELOAD WHITE 60MM (STAPLE) ×36
STENT SET URETHERAL LEFT 7FR (STENTS) IMPLANT
STENT SET URETHERAL RIGHT 7FR (STENTS) ×6 IMPLANT
SURGIFLO W/THROMBIN 8M KIT (HEMOSTASIS) IMPLANT
SUT CHROMIC 4 0 RB 1X27 (SUTURE) ×6 IMPLANT
SUT ETHILON 3 0 PS 1 (SUTURE) ×6 IMPLANT
SUT MNCRL AB 4-0 PS2 18 (SUTURE) ×12 IMPLANT
SUT PDS AB 0 CTX 36 PDP370T (SUTURE) ×18 IMPLANT
SUT PDS AB 1 CT1 27 (SUTURE) IMPLANT
SUT SILK 3 0 SH 30 (SUTURE) IMPLANT
SUT SILK 3 0 SH CR/8 (SUTURE) IMPLANT
SUT V-LOC BARB 180 2/0GR6 GS22 (SUTURE)
SUT VIC AB 0 CT1 27 (SUTURE) ×1
SUT VIC AB 0 CT1 27XBRD ANTBC (SUTURE) ×5 IMPLANT
SUT VIC AB 2-0 CT1 27 (SUTURE)
SUT VIC AB 2-0 CT1 27XBRD (SUTURE) IMPLANT
SUT VIC AB 2-0 SH 18 (SUTURE) IMPLANT
SUT VIC AB 2-0 SH 27 (SUTURE) ×1
SUT VIC AB 2-0 SH 27X BRD (SUTURE) ×5 IMPLANT
SUT VIC AB 2-0 UR5 27 (SUTURE) IMPLANT
SUT VIC AB 3-0 SH 27 (SUTURE) ×3
SUT VIC AB 3-0 SH 27X BRD (SUTURE) ×5 IMPLANT
SUT VIC AB 3-0 SH 27XBRD (SUTURE) ×10 IMPLANT
SUT VIC AB 4-0 RB1 27 (SUTURE) ×2
SUT VIC AB 4-0 RB1 27XBRD (SUTURE) ×10 IMPLANT
SUT VLOC BARB 180 ABS3/0GR12 (SUTURE) ×12
SUTURE V-LC BRB 180 2/0GR6GS22 (SUTURE) IMPLANT
SUTURE VLOC BRB 180 ABS3/0GR12 (SUTURE) ×10 IMPLANT
SYR 20ML LL LF (SYRINGE) ×6 IMPLANT
SYR CONTROL 10ML LL (SYRINGE) IMPLANT
SYSTEM UROSTOMY GENTLE TOUCH (WOUND CARE) IMPLANT
TOWEL OR 17X24 BULK DISP NS (TOWEL DISPOSABLE) ×6 IMPLANT
TOWEL OR 17X26 10 PK STRL BLUE (TOWEL DISPOSABLE) ×6 IMPLANT
TOWEL OR NON WOVEN STRL DISP B (DISPOSABLE) IMPLANT
TRAY FOL W/BAG SLVR 16FR STRL (SET/KITS/TRAYS/PACK) ×5 IMPLANT
TRAY FOLEY W/BAG SLVR 16FR LF (SET/KITS/TRAYS/PACK) ×1
TRAY LAPAROSCOPIC (CUSTOM PROCEDURE TRAY) ×6 IMPLANT
TROCAR BLADELESS 15MM (ENDOMECHANICALS) ×6 IMPLANT
TROCAR BLADELESS OPT 5 100 (ENDOMECHANICALS) IMPLANT
TROCAR UNIVERSAL OPT 12M 100M (ENDOMECHANICALS) IMPLANT
TROCAR XCEL 12X100 BLDLESS (ENDOMECHANICALS) ×6 IMPLANT
TROCAR XCEL NON-BLD 5MMX100MML (ENDOMECHANICALS) IMPLANT
TUBE CONNECTING VINYL 14FR 30C (TUBING) ×6 IMPLANT
TUBING CONNECTING 10 (TUBING) IMPLANT
WATER STERILE IRR 1000ML POUR (IV SOLUTION) ×6 IMPLANT
WATER STERILE IRR 3000ML UROMA (IV SOLUTION) ×6 IMPLANT

## 2021-07-04 NOTE — Progress Notes (Signed)
Short Stay called to get report from primary RN and report given. Short Stay RN informed that patient had not finished bowel prep overnight despite encouragement and education. Relayed that patient had only had 2 BMs early in the night. Short stay nurse stated she would contact surgeon regarding prep and call primary nurse back.  New order for suppository ordered and administered.

## 2021-07-04 NOTE — Transfer of Care (Signed)
Immediate Anesthesia Transfer of Care Note  Patient: Thomas Schroeder  Procedure(s) Performed: XI ROBOT ASSITED LAPAROSCOPIC NEPHROURETERECTOMY (Left) CYSTOSCOPY WITH INJECTION OF INDOCYANINE GREEN DYE XI ROBOTIC ASSISTED LAPAROSCOPIC LYSIS OF ADHESION AND BLADDER BIOPSY  Patient Location: PACU  Anesthesia Type:General  Level of Consciousness: awake, alert  and oriented  Airway & Oxygen Therapy: Patient Spontanous Breathing and Patient connected to face mask oxygen  Post-op Assessment: Report given to RN and Post -op Vital signs reviewed and stable  Post vital signs: Reviewed and stable  Last Vitals:  Vitals Value Taken Time  BP 135/85 07/04/21 1315  Temp    Pulse 71 07/04/21 1318  Resp 12 07/04/21 1318  SpO2 100 % 07/04/21 1318  Vitals shown include unvalidated device data.  Last Pain:  Vitals:   07/04/21 0758  TempSrc: Oral  PainSc:          Complications: No notable events documented.

## 2021-07-04 NOTE — Discharge Instructions (Signed)

## 2021-07-04 NOTE — Anesthesia Procedure Notes (Signed)
Arterial Line Insertion Start/End10/19/2022 8:00 AM Performed by: Nalea Salce D, CRNA, CRNA  Patient location: Pre-op. Preanesthetic checklist: patient identified, IV checked, site marked, risks and benefits discussed, surgical consent, monitors and equipment checked, pre-op evaluation, timeout performed and anesthesia consent Lidocaine 1% used for infiltration Left, radial was placed Catheter size: 20 G Hand hygiene performed  and maximum sterile barriers used   Attempts: 1 Procedure performed without using ultrasound guided technique. Following insertion, dressing applied. Post procedure assessment: normal and unchanged  Patient tolerated the procedure well with no immediate complications.

## 2021-07-04 NOTE — Consult Note (Addendum)
WOC Nurse Consult Note: Pt did not receive a urostomy during surgery today; [facility] team will continue to follow for Vac dressing changes Q M/W/F while in the hospital.  Reason for Consult: Pt arrived from PACU and denies feeling pain at the affected location, no meds are required. 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  Bone palpable with a swab, wound bed is pink, scant amt tan drainage. Applied barrier ring surrounding the wound and one piece black foam, then bridged track pad to left hip to decrease pressure. Cont suction on at 134mm. Pressure Injury POA: Yes Dressing procedure/placement/frequency: Case management: Pt will need a Negative pressure wound therapy machine ordered for discharge home with home health; this was their previous plan of care but was not implemented since patient was admitted to the hospital.  [place] team will plan to change the dressing on Fri if patient is still in the hospital at that time. Air mattress in place to reduce pressure. Julien Girt MSN, RN, [place], [place], [place]

## 2021-07-04 NOTE — TOC Initial Note (Signed)
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 Now with Rt neph tube. Had opinion form Dr. Lamonte Richer who favored up front surgery given poor GFR.   2 - Stage 3 Renal Insufficiency / Left Atrophic Kidney - left renal atrophy x years, appears 1 artery / 1 vein left renovascular anatomy. Cr 1.9 at baseline.   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 Most recetn clonal CX Klebsiella 03/2021 sens keflex, cipro, bactrim   PMH sig for T spine paraplegis sinc e1990s after trauma, open appy as child. NO ischemic CV disease / blood thinners. 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 His wife is very involved. His PCP is [name] MD.   Today " Thomas Schroeder " is seen to proceed with cystectomy / left nephroureterectomy for locally advanced bladder cancer. Hgb 9.7, Cr 1.5. Had some trouble with bowel prep as expected given neurologic history but copious BM x 2 after SPP this AM.  TOC PLAN OF CARE: Will follow post-op for any toc needs and progression. Expected Discharge Plan: Home/Self Care Barriers to Discharge: Continued Medical Work up   Patient Goals and CMS Choice Patient states their goals for this hospitalization and ongoing recovery are:: unable to state in or      Expected  Discharge Plan and Services Expected Discharge Plan: Home/Self Care   Discharge Planning Services: CM Consult   Living arrangements for the past 2 months: Single Family Home                                      Prior Living Arrangements/Services Living arrangements for the past 2 months: Single Family Home Lives with:: Significant Other, Soil scientist                   Activities of Daily Living Home Assistive Devices/Equipment: Wheelchair ADL Screening (condition at time of admission) Patient's cognitive ability adequate to safely complete daily activities?: No Is the patient deaf or have difficulty hearing?: No Does the patient have difficulty seeing, even when wearing glasses/contacts?: No Does the patient have difficulty concentrating, remembering, or making decisions?: No Patient able to express need for assistance with ADLs?: Yes Does the patient have difficulty dressing or bathing?: No Independently performs ADLs?: Yes (appropriate for developmental age) Does the patient have difficulty walking or climbing stairs?: Yes Weakness of Legs: Both Weakness of Arms/Hands: None  Permission Sought/Granted                  Emotional Assessment         Alcohol / Substance Use: Not Applicable Psych Involvement: No (comment)  Admission diagnosis:  BLADDER CANCER, LEFT ATROPHIC KIDNEY Patient Active Problem List   Diagnosis Date Noted   Stage 3a chronic kidney disease ([place]) 05/17/2021   Normochromic anemia 05/17/2021   Urothelial carcinoma of bladder ([place]) 05/17/2021   Shock circulatory ([place]) 05/07/2021   Pressure injury of skin 04/06/2021   Obstructive uropathy 04/05/2021   Grade II internal hemorrhoids    Rectal polyp    Rectal ulcer    Colon cancer screening 01/16/2021   Positive colorectal cancer screening using Cologuard test 01/16/2021   Hip fracture ([place]) 06/23/2020   Closed comminuted intertrochanteric fracture of left femur ([place])  06/23/2020   Muscle spasticity 19/14/7829   Complicated UTI (urinary tract infection) 04/20/2020   Severe sepsis with septic shock ([place]) 04/20/2020   Alcohol use 04/20/2020   Anxiety 04/20/2020   Hyperlipidemia 06/16/2015   Recurrent kidney stones 08/04/2014   History of decubitus ulcer 10/18/2013   Male erectile disorder 10/02/2012   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   Perineal ulcer, limited to breakdown of skin ([place]) 09/04/2011   Microscopic colitis 04/12/2011   Nonspecific findings on examination of blood 03/30/2009   Paraplegia ([place]) 03/13/2009   Osteopenia 03/13/2009   Bone/cartilage disorder 03/13/2009   PCP:  Hali Marry, MD Pharmacy:   [id number] - [place], [place] - [address] [place] [address] Phone: [phone] Fax: [phone]     Social Determinants of Health (SDOH) Interventions    Readmission Risk Interventions No flowsheet data found.

## 2021-07-04 NOTE — Progress Notes (Signed)
Received pt from PACU, arouses easily, VSs lab sites intact, JP charged, Dawn arrived to set wound VAC and plan to return Friday to complete. Denies pain will cont to check. SRP, RN

## 2021-07-04 NOTE — Consult Note (Addendum)
[facility] Nurse Consult Note: Informed by staff nurse that pt has a pressure injury which was scheduled to have a negative pressure wound therapy device applied by home health, but then he was admitted to the hospital.  Attempted to assess the wound appearance but pt was leaving for the OR.   Pt's wife was at the bedside and showed me photos on her phone and told me wound measurements.  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  Wound is beefy red and located over the location of a previous surgical flap. She was able to tell me the measurements: 2X2X2cm with tunneling to 6 cm. Discussed with wife that the location close to the rectum may impair the seal of a Vac dressing, as well as the patient frequently sliding on and off his bed and wheelchair. Pt has a regular bowel prep routine several times a week and is rarely incontinent of stool. I will attempt to apply the dressing after the patient returns from surgery today as was the previous plan of care. Supplies ordered to the room.  Pressure Injury POA: Yes Thank-you,  Julien Girt MSN, [place], [place], [place], [place]

## 2021-07-04 NOTE — Brief Op Note (Signed)
07/04/2021  1:05 PM  PATIENT:  Thomas Schroeder  60 y.o. male  PRE-OPERATIVE DIAGNOSIS:  BLADDER CANCER, LEFT ATROPHIC KIDNEY  POST-OPERATIVE DIAGNOSIS:  BLADDER CANCER, LEFT ATROPHIC KIDNEY  PROCEDURE:  Procedure(s): XI ROBOT ASSITED LAPAROSCOPIC NEPHROURETERECTOMY (Left) CYSTOSCOPY WITH INJECTION OF INDOCYANINE GREEN DYE (N/A) XI ROBOTIC ASSISTED LAPAROSCOPIC LYSIS OF ADHESION AND BLADDER BIOPSY  SURGEON:  Surgeon(s) and Role:    Alexis Frock, MD - Primary  PHYSICIAN ASSISTANT:   ASSISTANTS: Debbrah Alar PA   ANESTHESIA:   local and general  EBL:  100 mL   BLOOD ADMINISTERED:none  DRAINS:  JP to bulb; Rt nephrostomy to gravity    LOCAL MEDICATIONS USED:  MARCAINE     SPECIMEN:  Source of Specimen:  left kindey + ureter; necrotic bladder tissue  DISPOSITION OF SPECIMEN:  PATHOLOGY  COUNTS:  YES  TOURNIQUET:  * No tourniquets in log *  DICTATION: .Other Dictation: Dictation Number [id number]  PLAN OF CARE: Admit to inpatient   PATIENT DISPOSITION:  PACU - hemodynamically stable.   Delay start of Pharmacological VTE agent (>24hrs) due to surgical blood loss or risk of bleeding: yes

## 2021-07-04 NOTE — Anesthesia Procedure Notes (Signed)
Procedure Name: Intubation Date/Time: 07/04/2021 9:03 AM Performed by: Zarie Kosiba D, CRNA Pre-anesthesia Checklist: Patient identified, Emergency Drugs available, Suction available and Patient being monitored Patient Re-evaluated:Patient Re-evaluated prior to induction Oxygen Delivery Method: Circle system utilized Preoxygenation: Pre-oxygenation with 100% oxygen Induction Type: IV induction Ventilation: Mask ventilation without difficulty Laryngoscope Size: Mac and 4 Grade View: Grade I Tube type: Oral Tube size: 7.5 mm Number of attempts: 1 Airway Equipment and Method: Stylet Placement Confirmation: ETT inserted through vocal cords under direct vision, positive ETCO2 and breath sounds checked- equal and bilateral Tube secured with: Tape Dental Injury: Teeth and Oropharynx as per pre-operative assessment

## 2021-07-04 NOTE — H&P (Signed)
Thomas Schroeder is an 60 y.o. male.    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 Now with Rt neph tube. Had opinion form Dr. Lamonte Richer who favored up front surgery given poor GFR.   2 - Stage 3 Renal Insufficiency / Left Atrophic Kidney - left renal atrophy x years, appears 1 artery / 1 vein left renovascular anatomy. Cr 1.9 at baseline.   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 Most recetn clonal CX Klebsiella 03/2021 sens keflex, cipro, bactrim   PMH sig for T spine paraplegis sinc e1990s after trauma, open appy as child. NO ischemic CV disease / blood thinners. 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 His wife is very involved. His PCP is [name] MD.   Today " Navjot " is seen to proceed with cystectomy / left nephroureterectomy for locally advanced bladder cancer. Hgb 9.7, Cr 1.5. Had some trouble with bowel prep as expected given neurologic history but copious BM x 2 after SPP this AM.   Past Medical History:  Diagnosis Date   Allergy    Chronic indwelling Foley catheter    Chronic kidney disease    ED (erectile dysfunction)    History of recurrent UTIs    Night muscle spasms    Paraplegia [facility])     Past Surgical History:  Procedure Laterality Date   APPENDECTOMY     BIOPSY  03/22/2021   Procedure: BIOPSY;  Surgeon: Lavena Bullion, DO;  Location: [facility];  Service: Gastroenterology;;   COLONOSCOPY N/A 03/22/2021   Procedure: COLONOSCOPY;  Surgeon: Lavena Bullion, DO;  Location: [facility];  Service:  Gastroenterology;  Laterality: N/A;   CYSTOSCOPY W/ RETROGRADES Bilateral 04/05/2021   Procedure: CYSTOSCOPY;  Surgeon: Cleon Gustin, MD;  Location: [facility];  Service: Urology;  Laterality: Bilateral;   FEMUR FRACTURE SURGERY     INTRAMEDULLARY (IM) NAIL INTERTROCHANTERIC Left 06/24/2020   Procedure: INTRAMEDULLARY (IM) NAIL FEMORAL;  Surgeon: Mcarthur Rossetti, MD;  Location: [facility];  Service: Orthopedics;  Laterality: Left;   IR NEPHROSTOMY EXCHANGE RIGHT  05/14/2021   IR NEPHROSTOMY PLACEMENT RIGHT  04/06/2021   IR US GUIDE VASC ACCESS LEFT  04/06/2021   kidney stone removal     POLYPECTOMY  03/22/2021   Procedure: POLYPECTOMY;  Surgeon: Lavena Bullion, DO;  [facility]: [facility];  Service: Gastroenterology;;   RT tibia fracture     T8 T9 fusion      TONSILLECTOMY     TRANSURETHRAL RESECTION OF BLADDER TUMOR N/A 04/05/2021   Procedure: TRANSURETHRAL RESECTION OF BLADDER TUMOR (TURBT);  Surgeon: Cleon Gustin, MD;  Location: [facility];  Service: Urology;  Laterality: N/A;    Family History  Problem Relation Age of Onset   Hyperlipidemia Mother    Diabetes Mother    Rosacea Mother    Lung cancer Mother        smoker   Hypertension Neg Hx    Social History:  reports that he has never smoked. He has never used smokeless tobacco. He reports current alcohol  use of about 10.0 standard drinks per week. He reports that he does not currently use drugs.  Allergies:  Allergies  Allergen Reactions   Bactrim Rash    Skin sloughing    Ropinirole Other (See Comments)    Increased leg spasms/poor sleep.    Latex Rash    Medications Prior to Admission  Medication Sig Dispense Refill   acetaminophen (TYLENOL) 500 MG tablet Take 1,000 mg by mouth every 6 (six) hours as needed for moderate pain or headache.     aspirin EC 325 MG tablet Take 325 mg by mouth daily.     atorvastatin (LIPITOR) 20 MG tablet TAKE 1 TABLET AT BEDTIME (Patient taking differently: Take 20 mg by mouth  daily.) 90 tablet 3   ciprofloxacin (CIPRO) 500 MG tablet Take 1 tablet (500 mg total) by mouth 2 (two) times daily for 14 days. 28 tablet 0   CRANBERRY EXTRACT PO Take 4,200 mg by mouth daily.     diazepam (VALIUM) 10 MG tablet Take 10-30 mg by mouth daily as needed for anxiety.     Multiple Vitamin (MULTI VITAMIN MENS PO) Take 1 tablet by mouth daily.     Omega-3 Fatty Acids (FISH OIL) 1200 MG CAPS Take 1,200 mg by mouth daily.      Results for orders placed or performed during the hospital encounter of 07/03/21 (from the past 48 hour(s))  Surgical pcr screen     Status: None   Collection Time: 07/03/21  2:44 PM   Specimen: Nasopharyngeal Swab; Nasal Swab  Result Value Ref Range   MRSA, PCR NEGATIVE NEGATIVE   Staphylococcus aureus NEGATIVE NEGATIVE    Comment: (NOTE) The Xpert SA Assay (FDA approved for NASAL specimens in patients 40 years of age and older), is one component of a comprehensive surveillance program. It is not intended to diagnose infection nor to guide or monitor treatment. Performed at [facility], [place] [address], [place], [place] [address]   SARS Coronavirus 2 by RT PCR (hospital order, performed in [facility] hospital lab) Nasopharyngeal Nasopharyngeal Swab     Status: None   Collection Time: 07/03/21  2:44 PM   Specimen: Nasopharyngeal Swab  Result Value Ref Range   SARS Coronavirus 2 NEGATIVE NEGATIVE    Comment: (NOTE) SARS-CoV-2 target nucleic acids are NOT DETECTED.  The SARS-CoV-2 RNA is generally detectable in upper and lower respiratory specimens during the acute phase of infection. The lowest concentration of SARS-CoV-2 viral copies this assay can detect is 250 copies / mL. A negative result does not preclude SARS-CoV-2 infection and should not be used as the sole basis for treatment or other patient management decisions.  A negative result may occur with improper specimen collection / handling, submission of specimen  other than nasopharyngeal swab, presence of viral mutation(s) within the areas targeted by this assay, and inadequate number of viral copies (<250 copies / mL). A negative result must be combined with clinical observations, patient history, and epidemiological information.  Fact Sheet for Patients:   StrictlyIdeas.no  Fact Sheet for Healthcare Providers: BankingDealers.co.za  This test is not yet approved or  cleared by the [place] FDA and has been authorized for detection and/or diagnosis of SARS-CoV-2 by FDA under an Emergency Use Authorization (EUA).  This EUA will remain in effect (meaning this test can be used) for the duration of the COVID-19 declaration under Section 564(b)(1) of the Act, 21 U.S.C. section 360bbb-3(b)(1), unless the authorization is terminated or revoked sooner.  Performed at [facility], [place] [address], [place], [place] [address]   CBC     Status: Abnormal   Collection Time: 07/03/21  3:18 PM  Result Value Ref Range   WBC 10.3 4.0 - 10.5 K/uL   RBC 3.87 (L) 4.22 - 5.81 MIL/uL   Hemoglobin 9.7 (L) 13.0 - 17.0 g/dL   HCT 33.1 (L) 39.0 - 52.0 %   MCV 85.5 80.0 - 100.0 fL   MCH 25.1 (L) 26.0 - 34.0 pg   MCHC 29.3 (L) 30.0 - 36.0 g/dL   RDW 16.3 (H) 11.5 - 15.5 %   Platelets 381 150 - 400 K/uL   nRBC 0.0 0.0 - 0.2 %    Comment: Performed at [facility], [place] [address] [place], [place] [address]  Comprehensive metabolic panel     Status: Abnormal   Collection Time: 07/03/21  3:18 PM  Result Value Ref Range   Sodium 136 135 - 145 mmol/L   Potassium 3.8 3.5 - 5.1 mmol/L   Chloride 106 98 - 111 mmol/L   CO2 23 22 - 32 mmol/L   Glucose, Bld 100 (H) 70 - 99 mg/dL    Comment: Glucose reference range applies only to samples taken after fasting for at least 8 hours.   BUN 27 (H) 8 - 23 mg/dL   Creatinine, Ser 1.53 (H) 0.61 - 1.24 mg/dL   Calcium 12.0 (H) 8.9 - 10.3  mg/dL   Total Protein 7.5 6.5 - 8.1 g/dL   Albumin 3.1 (L) 3.5 - 5.0 g/dL   AST 22 15 - 41 U/L   ALT 35 0 - 44 U/L   Alkaline Phosphatase 184 (H) 38 - 126 U/L   Total Bilirubin 0.4 0.3 - 1.2 mg/dL   GFR, Estimated 51 (L) >60 mL/min    Comment: (NOTE) Calculated using the CKD-EPI Creatinine Equation (2021)    Anion gap 7 5 - 15    Comment: Performed at [facility], [place] [address] [place], [place] [address]  Prepare RBC (crossmatch)     Status: None   Collection Time: 07/03/21  3:28 PM  Result Value Ref Range   Order Confirmation      ORDER PROCESSED BY BLOOD BANK Performed at [facility], [place] [address] [place], [place] [address]   Type and screen [place]     Status: None (Preliminary result)   Collection Time: 07/03/21  3:28 PM  Result Value Ref Range   ABO/RH(D) A NEG    Antibody Screen NEG    Sample Expiration      07/06/2021,2359 Performed at [address] [place], [place] [address]    Unit Number [id number]    Blood Component Type RED CELLS,LR    Unit division 00    Status of Unit ALLOCATED    Transfusion Status OK TO TRANSFUSE    Crossmatch Result Compatible    Unit Number [id number]    Blood Component Type RED CELLS,LR    Unit division 00    Status of Unit ALLOCATED    Transfusion Status OK TO TRANSFUSE    Crossmatch Result Compatible    DG CHEST PORT 1 VIEW  Result Date: 07/03/2021 CLINICAL DATA:  Preop exam. EXAM: PORTABLE CHEST 1 VIEW COMPARISON:  Chest x-ray 06/12/2021. FINDINGS: The heart size and mediastinal contours are within normal limits. Both lungs are clear. There is a healed right eighth rib fracture. IMPRESSION: No active  disease. Electronically Signed   By: Ronney Asters M.D.   On: 07/03/2021 18:18    Review of Systems  Constitutional:  Negative for chills and fever.  All other systems reviewed and are negative.  Blood pressure (!) 95/58,  pulse 82, temperature 98.6 F (37 C), temperature source Oral, resp. rate 16, height 5\' 6"  (1.676 m), weight 66 kg, SpO2 99 %. Physical Exam Vitals reviewed.  Constitutional:      Comments: Pleasant, at baseline.   HENT:     Head: Normocephalic.     Nose: Nose normal.  Eyes:     Pupils: Pupils are equal, round, and reactive to light.  Cardiovascular:     Pulses: Normal pulses.  Pulmonary:     Effort: Pulmonary effort is normal.  Abdominal:     Comments: Some rotational defects c/w paraplegai.   Genitourinary:    Penis: Normal.      Comments: Catheter in place.  Musculoskeletal:     Cervical back: Normal range of motion.  Skin:    General: Skin is warm.  Neurological:     Mental Status: He is alert.  Psychiatric:        Mood and Affect: Mood normal.     Assessment/Plan  Proceed as planned with LEFT nephro-ureterectomy, cystoprostatectomy, pelvic node dissection, right cutaneous ureterostomy for advanced cancer with malignant onbsturction. Risks (including non-cure and mortality), benefits, alternatives, expected peri-op course discussed previously and reitereatd today. His advanced neurologic disease and likely oligometastatic cancer increase risk of all peri-op complications.   Alexis Frock, MD 07/04/2021, 7:20 AM

## 2021-07-04 NOTE — Progress Notes (Addendum)
Pt sent to surgery, informed by night RN pt did complete bowel prep, asked pt if he had difficulties completing prep. Pt stated he was nauseated at times. Pt given suppository by night RN. Pt sent to OR. SRP, RN

## 2021-07-05 ENCOUNTER — Encounter (HOSPITAL_COMMUNITY): Payer: Self-pay | Admitting: Urology

## 2021-07-05 LAB — BASIC METABOLIC PANEL
Anion gap: 7 (ref 5–15)
BUN: 22 mg/dL (ref 8–23)
CO2: 18 mmol/L — ABNORMAL LOW (ref 22–32)
Calcium: 10.6 mg/dL — ABNORMAL HIGH (ref 8.9–10.3)
Chloride: 107 mmol/L (ref 98–111)
Creatinine, Ser: 1.55 mg/dL — ABNORMAL HIGH (ref 0.61–1.24)
GFR, Estimated: 51 mL/min — ABNORMAL LOW (ref >60)
Glucose, Bld: 171 mg/dL — ABNORMAL HIGH (ref 70–99)
Potassium: 4.7 mmol/L (ref 3.5–5.1)
Sodium: 132 mmol/L — ABNORMAL LOW (ref 135–145)

## 2021-07-05 LAB — HEMOGLOBIN AND HEMATOCRIT, BLOOD
HCT: 27.2 % — ABNORMAL LOW (ref 39.0–52.0)
Hemoglobin: 8.2 g/dL — ABNORMAL LOW (ref 13.0–17.0)

## 2021-07-05 LAB — GLUCOSE, CAPILLARY: Glucose-Capillary: 137 mg/dL — ABNORMAL HIGH (ref 70–99)

## 2021-07-05 MED ORDER — ALUM & MAG HYDROXIDE-SIMETH 200-200-20 MG/5ML PO SUSP
30.0000 mL | Freq: Four times a day (QID) | ORAL | Status: DC | PRN
  Administered 2021-07-05 – 2021-07-06 (×2): 30 mL via ORAL
  Filled 2021-07-05 (×2): qty 30

## 2021-07-05 NOTE — TOC Progression Note (Signed)
Transition of Care [facility]) - Progression Note    Patient Details  Name: Thomas Schroeder MRN: [id number] Date of Birth: 29-Dec-1960  Transition of Care [facility]) CM/SW Contact  Purcell Mouton, RN Phone Number: 07/05/2021, 4:56 PM  Clinical Narrative:    [facility] request [place] RN measurements.   Expected Discharge Plan: Home/Self Care Barriers to Discharge: Continued Medical Work up  Expected Discharge Plan and Services Expected Discharge Plan: Home/Self Care   Discharge Planning Services: CM Consult   Living arrangements for the past 2 months: Single Family Home                                       Social Determinants of Health (SDOH) Interventions    Readmission Risk Interventions No flowsheet data found.

## 2021-07-05 NOTE — Progress Notes (Signed)
Patient's BMP (10/20) showed CBG of 171.  RN rechecked blood sugar, 137.  RN notified MD of CBG reading >140 per order.  No new orders per MD.  Angie Fava, RN

## 2021-07-05 NOTE — Progress Notes (Signed)
1 Day Post-Op   Subjective/Chief Complaint:   1 - Metastatic, Unresectable. Bladder Cancer - large bladder cancer (T2 urothelial with R>L malignant obstruction and PET-avid <2cm left obturator node) on eval 03/2021. Now with Rt neph tube. Had opinion form Dr. Lamonte Richer who favored up front surgery given poor GFR. Attempt cystoprstatectomy 07/04/21 with unresectable disease, left sidewall, small bowel, large bowel invasion from tumor. JP removed 10/20 as output scant.   2 - Stage 3 Renal Insufficiency - left renal atrophy x years, appears 1 artery / 1 vein left renovascular anatomy. Cr 1.9 at baseline. 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 VAC initiated this admission and to continue at discharge.   Today " Thomas Schroeder " is stable. VAC now on decubiti. JP output low and removed. He would like home VAC if possible as recommended by wound-ostomy RN.    Objective: Vital signs in last 24 hours: Temp:  [96.5 F (35.8 C)-98.1 F (36.7 C)] 97.7 F (36.5 C) (10/20 0606) Pulse Rate:  [68-91] 91 (10/20 0606) Resp:  [12-18] 16 (10/20 0606) BP: (100-146)/(62-111) 102/63 (10/20 0606) SpO2:  [89 %-100 %] 98 % (10/20 0606) Last BM Date: 07/04/21  Intake/Output from previous day: 10/19 0701 - 10/20 0700 In: 5969.5 [P.O.:50; I.V.:5669.5; IV Piggyback:250] Out: 1760 [Urine:1500; Drains:160; Blood:100] Intake/Output this shift: No intake/output data recorded.  General appearance: alert, cooperative, and significant other Michelle at bedside. Both in good spirits.   Eyes: conjunctivae/corneas clear. PERRL, EOM's intact. Fundi benign. Ears: normal TM's and external ear canals both ears Nose: Nares normal. Septum midline. Mucosa normal. No drainage or sinus tenderness. Neck: no adenopathy Back: symmetric, no curvature. ROM normal. No CVA tenderness. Resp: non-labored on RA Cardio: regular rate and rhythm, S1, S2 normal, no murmur, click, rub or gallop GI: stable mild obesity and palpable / fixed pelvic mass.Rt neph tube in place with non-foul urine. LLQ JP removed (output serouis and non-foul) and dry dressing placed.  Male genitalia: normal Extremities: extremities normal, atraumatic, no cyanosis or edema Pulses: 2+ and symmetric Incision/Wound: Sacral wound with VAC in place with good seal.   Lab Results:  Recent Labs    07/03/21 1518 07/04/21 1333 07/05/21 0352  WBC 10.3  --   --   HGB 9.7* 8.9* 8.2*  HCT 33.1* 29.4* 27.2*  PLT 381  --   --    BMET Recent Labs    07/03/21 1518 07/05/21 0352  NA 136 132*  K 3.8 4.7  CL 106 107  CO2 23 18*  GLUCOSE 100* 171*  BUN 27* 22  CREATININE 1.53* 1.55*  CALCIUM 12.0* 10.6*   PT/INR No results for input(s): LABPROT, INR in the last 72 hours. ABG No results for input(s): PHART, HCO3 in the last 72 hours.  Invalid input(s): PCO2, PO2  Studies/Results: DG CHEST PORT 1 VIEW  Result Date: 07/03/2021 CLINICAL DATA:  Preop exam. EXAM: PORTABLE CHEST 1 VIEW COMPARISON:  Chest x-ray 06/12/2021. FINDINGS: The heart size and mediastinal contours are within normal limits. Both lungs are clear. There is a healed right  eighth rib fracture. IMPRESSION: No active disease. Electronically Signed   By: Ronney Asters M.D.   On: 07/03/2021 18:18    Anti-infectives: Anti-infectives (From admission, onward)    Start     Dose/Rate Route Frequency Ordered Stop   07/04/21 0600  piperacillin-tazobactam (ZOSYN) IVPB 2.25 g       Note to Pharmacy: 2.25g per Dr. Tresa Moore.   2.25 g 100 mL/hr over 30 Minutes  Intravenous 30 min pre-op 07/03/21 1424 07/04/21 0905   07/03/21 1815  neomycin (MYCIFRADIN) tablet 500 mg        500 mg Oral Every 4 hours 07/03/21 1723 07/03/21 2122   07/03/21 1815  metroNIDAZOLE (FLAGYL) tablet 500 mg        500 mg Oral Every 4 hours 07/03/21 1723 07/03/21 2122       Assessment/Plan:  Plan for DC tomorrow, will request [facility] for new VAC management. He has good understnading of overall guarded prognosis with unresectable aggressive bladder cancer and we will re-refer to medical oncology to consdier role of immunetherapy with palliatve intent.   Appreciate wound-ostomy help.    Alexis Frock 07/05/2021

## 2021-07-05 NOTE — Op Note (Signed)
NAME: Thomas Schroeder, Thomas Schroeder MEDICAL RECORD NO: [id number] ACCOUNT NO: [id number] DATE OF BIRTH: 1960-10-18 FACILITY: [facility] LOCATION: [id number] PHYSICIAN: Alexis Frock, MD  Operative Report   DATE OF PROCEDURE: 07/04/2021  PREOPERATIVE DIAGNOSES:  Left atrophic kidney, oligometastatic bladder cancer, renal insufficiency.  POSTOPERATIVE DIAGNOSES:  Left atrophic kidney, oligometastatic bladder cancer, renal insufficiency, stage IV unresectable bladder cancer.  PROCEDURES: 1.  Left robotic nephroureterectomy. 2.  Cystoscopy with injection of indocyanine green dye. 3.  Robotic-assisted laparoscopic adhesiolysis. 4.  Bladder biopsy.  ASSISTANT:  Bari Mantis, PA  ESTIMATED BLOOD LOSS:  100 mL.  COMPLICATIONS:  None.  SPECIMENS: 1.  Left kidney plus ureter. 2.  Necrotic bladder tissue.  DRAINS: 1.  Jackson-Pratt cannula to suction. 2.  Right nephrostomy to gravity drainage.  FINDINGS:    1.  Very desmoplastic inflamed area of left kidney and ureter.  Single artery, single vein, left renal vascular anatomy anticipated. 2.  Very large volume necrotic bladder tumor encompassing the entire urothelium with cystoscopy. 3.  Locally advanced unresectable bladder cancer with direct tumor invasion into large bowel, small bowel, left pelvic sidewall.  INDICATIONS:  The patient is a pleasant, but unfortunate 60 year old male with history of paraplegia, status post trauma nearly 30 years ago.  He has remarkably good functional status, works full time, has quite well adapted to his physiology.  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  Also significant renal insufficiency.  Right nephrostomy tube was  placed.  He had medical oncology evaluation, which favored upfront surgery if possible.  He was referred for consideration of therapy. His imaging was reviewed.   He was found to have concern for possible oligometastatic disease with a very small left  obturator node being PET avid as well as essentially nonfunctional left kidney.  Options were discussed including palliative only protocols versus maximally aggressive with left nephroureterectomy, cystoprostatectomy, and right cutaneous ureterostomy  with curative intent understanding this likely to complete cure was low.  He adamantly wished to proceed.  He was met yesterday for bowel prep, stoma marking, and labs.  Informed consent was signed and placed in the medical record.  DESCRIPTION OF PROCEDURE:  The patient being Thomas Schroeder verified.  Procedure being left nephroureterectomy, cysto, and right cutaneous ureterostomy was confirmed.  Procedure timeout was performed.  Intravenous antibiotics were administered.  General  endotracheal anesthesia was induced.  A Foley catheter placed to straight drain was in situ.  Right nephrostomy tube was capped.  He was initially placed into a left side up full flank position, pulling 15 degrees table flexion, superior arm elevator,  axillary roll, sequential compression devices, bottom leg bent, top leg straight.  He was very carefully padded.  Good care was taken to avoid any tension or extension on his lower extremities, which were atrophic.  Next, a high flow, low pressure  pneumoperitoneum was obtained using Veress technique in the left lower quadrant having passed the aspiration and drop test.  Next, an 8 mm robotic camera port was placed in position approximately a handbreadth superior lateral to the umbilicus.   Additional ports were placed as follows:  Left subcostal 8 mm robotic port, left far lateral 8 mm robotic port approximately 4 fingerbreadths superior and medial to the anterior iliac spine, left paramedian inferior robotic port approximately 1.5  handbreadth superior to the pubic ramus, and a 12 mm assistant port site  in the midline approximately 3 fingerbreadths  superior to the umbilicus.  Laparoscopic examination of the peritoneal cavity on the left side was unremarkable.  Attention was  directed to the left retroperitoneum. Incision made lateral to the descending colon from the area of splenic flexure posterior internal ring.  The colon was really carefully swept medially.  There was significant desmoplastic reaction near the left  kidney, which necessitated incredibly careful and somewhat tedious dissection of the colonic mesentery off the anterior surface of Gerota's kidney, but this was quite successful.  Lateral splenic attachments were taken down allowing the spleen to rotate  medially.  Locally, kidney area was identified, placed on anterolateral traction.  Dissection proceeded medial to this. Gonadal vessels were encountered as was the left ureter.  It was hydronephrotic and somewhat desmoplastic as anticipated.  The ureter  and gonadal were placed on gentle lateral traction.  Dissection proceeded within the triangle of these structures and psoas musculature towards the renal hilum.  The renal hilum consisted with single artery, single vein renal vascular anatomy as  anticipated.  Again, the planes around this were quite adherent.  Artery was controlled using vascular stapler.  Vein was only controlled using vascular stapler.  This resulted in excellent hemostatic control of the hilum.  Superomedial attachments were  taken down using vascular stapler separating the adrenal gland away from the superior medial surface of the kidney.  Superior attachments were then taken down with cautery scissors at lateral attachments, and the ureter was then dissected distally to the  level just below the iliac vessels, which doubly clipped with large clips and ligated.  Specimen was then placed into EndoCatch bag for later retrieval.  The specimen bag being brought forth via 12 mm midline assistant port site.  The superior most  robotic port site and camera port site were  then closed using subcuticular Monocryl followed by Dermabond.  The remaining 3 inferior incisions were covered using Ioban.  The patient was then repositioned into a low lithotomy position on a separate  operating table, which had been prepared. The arms were carefully tucked to the side using gel rolls.  He was further fastened to the operating table using 3-inch tape with foam padding across the supraxiphoid chest.  A test of steep Trendelenburg  positioning was performed.  He was found to be suitably positioned.  He did have sufficient mobility of his lower extremities to allow for lithotomy.  New sterile field was created prepping and draping his entire abdomen using chlorhexidine gluconate and  his penis, perineum, and proximal thighs using iodine.  After removing the Ioban, which kept the inferior incision sterile, an LAVH type drape was then applied.  Pneumoperitoneum was reestablished placing a 12 mm port in the prior supraumbilical midline  site.  After careful palpation to ensure no visceral structures within the port tract, laparoscopic examination of peritoneal cavity did reveal some small bowel coursing over. There was injury at the dome of the bladder. There was no obvious  intraperitoneal disease noted at this point.  As such, additional ports were placed as follows:  Right paramedian 8 mm robotic port, right far lateral 12 mm AirSeal assistant port, right paramedian 15 mm assistant port at the previously marked stomal  site, and the left paramedian and left far lateral ports were reestablished at the other previous sites.  The robot was docked and passed the electronic checks again. Just prior to cocking   Cystourethroscopy was performed using a 24-French  rigid  scope with Boston lens.  Anterior and posterior urethra were unremarkable.  Inspection of the bladder revealed massive amount of necrotic tumor, and 2 mL of indocyanine green dye was placed at the bladder neck posteriorly, and a new  silicone type  Foley catheter was placed per urethra to straight drain, 10 mL were in the balloon.  Inspection of the peritoneal cavity and deep pelvis with robotic guidance revealed some densely adherent tissue in left lateral plane highly concerning for locally  advanced disease.  The area of adhesions was then more carefully inspected.  There was a very large loop of small bowel that appeared to be mid ileum draped over the dome of the bladder.  This was quite densely adherent.  Very careful adhesiolysis was  performed, and it was clearly obvious that there was large volume extravesical tumor directly invading the mesentery of his bowel with some area likely developing the serosal surfaces invading.  Additional adhesiolysis was very carefully performed  To the point that was quite concerned that additional lysis would result in enterotomy.  I reassessed the pelvis, and there also appeared to be another area of very densely adherent adhesions with extravesical disease involving the colon.  It  was felt that in order to attempt bladder removal, which again would not be curative at this point, would require a very large volume small bowel resection, large bowel resection, and likely urinary and fecal diversion, and again, be non-curative.  I called the patient's wife and power of attorney, [name], by phone and discussed intraoperative findings including options of proceeding with very radical extirpative surgery understanding that  this would require bowel resection and several GI and GU diversions and still being uncurative versus abandoning the procedure today.  This was  his dependence on the right nephrostomy tube, however, keeping his GI tract in continuity and  avoiding the additional risks and hazards, which were quite substantial with proceeding with noncurative surgery.  She agreed that quality of life was most important in this scenario and agreed with termination of the procedure.  The serosal  surfaces of  the bladder that had obvious tumor extension were biopsied and set aside for permanent pathology, and the abdomen especially the bowel overlying the area of tumor was very carefully inspected.  There was no obvious enterotomy. The robot was then  undocked.  The previous right lateral most incision port sites were closed at the level of fascia using Carter-Thomason suture passer and 0 Vicryl as was the right paramedian 15 mm port site.  Specimens, which left kidney and ureter, were removed placing  the previous camera port site inferiorly towards the umbilicus, total distance approximately 4.5 cm, and left kidney and ureter were set aside for permanent pathology.   A closed suction drain was brought out through the previous left lateral most  robotic port site into the peritoneal cavity.  The extraction site was closed at the level of fascia using figure-of-eight PDS x4 followed by reapproximation of Scarpa's with running Vicryl.  All incision sites were infiltrated with  dilute lipolyzed Marcaine and closed at the level of the skin using subcuticular Monocryl followed by Dermabond.  The procedure was terminated.  The patient tolerated the procedure well.  No immediate perioperative complications.  The patient was taken  to postanesthesia care unit in stable condition.  Plan for the patient is admission.  Please note, first assistant, Bari Mantis, was crucial for all portions of the surgery today.  She provided invaluable retraction, suctioning,  vascular clipping, vascular stapling, and general first assistance.   ROH D: 07/04/2021 1:20:28 pm T: 07/04/2021 3:59:00 pm  JOB: [id number]/ [id number]

## 2021-07-05 NOTE — Anesthesia Postprocedure Evaluation (Signed)
Anesthesia Post Note  Patient: Hazeline Junker  Procedure(s) Performed: XI ROBOT ASSITED LAPAROSCOPIC NEPHROURETERECTOMY (Left) CYSTOSCOPY WITH INJECTION OF INDOCYANINE GREEN DYE XI ROBOTIC ASSISTED LAPAROSCOPIC LYSIS OF ADHESION AND BLADDER BIOPSY     Patient location during evaluation: PACU Anesthesia Type: General Level of consciousness: sedated Pain management: pain level controlled Vital Signs Assessment: post-procedure vital signs reviewed and stable Respiratory status: spontaneous breathing Cardiovascular status: stable Postop Assessment: no apparent nausea or vomiting Anesthetic complications: no   No notable events documented.  Last Vitals:  Vitals:   07/05/21 1634 07/05/21 1958  BP: (!) 102/54 108/60  Pulse: 93 99  Resp: 20 20  Temp: 36.7 C 37.5 C  SpO2: 99% 91%    Last Pain:  Vitals:   07/05/21 2036  TempSrc:   PainSc: Lester Jr

## 2021-07-05 NOTE — TOC Progression Note (Signed)
Transition of Care [facility]) - Progression Note    Patient Details  Name: REAKWON BARREN MRN: [id number] Date of Birth: 1961-01-15  [facility] of [facility] CM/SW Contact  Purcell Mouton, RN Phone Number: 07/05/2021, 1:36 PM  Clinical Narrative:    Spoke with pt's wife concerning Home Health. [facility] was selected. Referral given to [facility] for wound VAC. Waiting for [employer] rep to return the call.    Expected Discharge Plan: Home/Self Care Barriers to Discharge: Continued Medical Work up  Expected Discharge Plan and Services Expected Discharge Plan: Home/Self Care   Discharge Planning Services: CM Consult   Living arrangements for the past 2 months: Single Family Home                                       Social Determinants of Health (SDOH) Interventions    Readmission Risk Interventions No flowsheet data found.

## 2021-07-06 LAB — SURGICAL PATHOLOGY

## 2021-07-06 MED ORDER — AMOXICILLIN-POT CLAVULANATE 875-125 MG PO TABS: 1.0000 | ORAL_TABLET | Freq: Two times a day (BID) | ORAL | 0 refills | Status: AC

## 2021-07-06 MED ORDER — OXYCODONE-ACETAMINOPHEN 5-325 MG PO TABS: 1.0000 | ORAL_TABLET | Freq: Four times a day (QID) | ORAL | 0 refills | Status: DC | PRN

## 2021-07-06 MED ORDER — CIPROFLOXACIN HCL 500 MG PO TABS: 500.0000 mg | ORAL_TABLET | Freq: Two times a day (BID) | ORAL | 0 refills | Status: AC

## 2021-07-06 NOTE — Discharge Summary (Signed)
Physician Discharge Summary  Patient ID: Thomas Schroeder MRN: [id number] DOB/AGE: 1960/11/21 60 y.o.  Admit date: 07/03/2021 Discharge date: 07/06/2021  Admission Diagnoses: Bladder Cancer, Left Atrophic Kidney  Discharge Diagnoses:  Active Problems:   Bladder cancer [facility])   Discharged Condition: fair  Hospital Course:     1 - Metastatic, Unresectable. Bladder Cancer - large bladder cancer (T2 urothelial with R>L malignant obstruction and PET-avid <2cm left obturator node) on eval 03/2021. Now with Rt neph tube. Had opinion form Dr. Lamonte Richer who favored up front surgery given poor GFR. Attempt cystoprstatectomy 07/04/21 this admission with unresectable disease, left sidewall, small bowel, large bowel invasion from tumor. JP removed 10/20 as output scant. Admitted 10/18 for stomal marking and bowel prep. Path pending at discharge. Tentative plan consider palliative-intent systemic therapy pending further medical oncology eval.   2 - Stage 3 Renal Insufficiency - left renal atrophy x years, appears 1 artery / 1 vein left renovascular anatomy. Cr 1.9 at baseline. 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 VAC initiated this admission and to continue at discharge. Prior CX data psudomonas sens cipro.   5 - Mild Wound Erythema - mild wound erythema at discharge w/o complex featres c/w possible early cellulitis. RX cipro + augmentin at DC c/w prior CX data.   6 - Disposition - pt independent in most ADL's at home with assistance from his partner Thomas Schroeder. Home Health assists with wound care and to continue at discharge.   By the AM of 07/06/21, the day of discharge, he is at functional baseline, tollerating PO intake, pain managable, VAC and home health arranged, and felt to be adequate for  discharge.   Consults:  wound ostomy team  Significant Diagnostic Studies: labs: as per above  Treatments: surgery: as per above and local wound care.   Discharge Exam: Blood pressure (!) 107/56, pulse (!) 109, temperature 99.1 F (37.3 C), temperature source Oral, resp. rate 20, height 5\' 6"  (1.676 m), weight 66 kg, SpO2 97 %. General appearance: alert, cooperative, and at [place] at bedside Eyes: negative, wears glasses Nose: Nares normal. Septum midline. Mucosa normal. No drainage or sinus tenderness. Throat: lips, mucosa, and tongue normal; teeth and gums normal Neck: no adenopathy Back: symmetric, no curvature. ROM normal. No CVA tenderness. Resp: non-labored on room air Cardio: regular mild tachycardia (baseline) GI: soft, non-tender; bowel sounds normal; no masses,  no organomegaly and stable mild obeisty. Recent port and extraction sites c/d/I. Subtle blanching erythema at midling extraction area w/o driange / crepitus.  Male genitalia: normal Extremities: stable LE atrophy c/w paraplegia, no ulcers. Pulses: 2+ and symmetric Neurologic: Grossly normal Incision/Wound: Buttocks / sacral wound with VAC in place, sponge area bout 2x9cm.  Disposition: Home with home health     Follow-up Information     Alexis Frock, MD Follow up on 07/26/2021.   Specialty: Urology Why: at 9:30 for MD visit. Contact information: [place] [address] [phone]                 Signed: Alexis Frock 07/06/2021, 9:32 AM

## 2021-07-06 NOTE — Progress Notes (Signed)
[employer] provided portable wound vac and supplies for continued wound vac therapy at home.    Hospital-use wound vac disconnected, portable wound vac connected.  Patient's wife Selinda Eon present for procedure, verbalized understanding of connecting wound vac to power source to recharge battery.  Nephrostomy dressing changed, securement device changed.  PIVs removed.  Patient escorted to main entrance in his own wheelchair.  Angie Fava, RN

## 2021-07-06 NOTE — Consult Note (Addendum)
WOC Nurse Consult Note: Reason for Consult: Vac dressing changed with Pt's wife at the bedside to assess wound appearance and discuss the procedure. Pt denies feeling pain at the affected location, no meds are required. 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 Bone palpable, wound bed is pink, minimal amt yellow drainage in cannister. Applied barrier ring surrounding the wound and one piece black foam, then bridged track pad to left hip to decrease pressure. Cont suction on at 196mm. Pressure Injury POA: Yes Dressing procedure/placement/frequency: Case management notes indicate they are requesting a Negative pressure wound therapy machine to be ordered for discharge home with home health.  If it is [employer]ity, then this machine can be connected to the current dressing for use upon discharge.  If it is another brand, then home health service is supposed to come and apply that type of machine for discharge. [place] team will plan to change the dressing on Mon if patient is still in the hospital at that time. Air mattress in place to reduce pressure. Julien Girt MSN, RN, [place], [place], [place]

## 2021-07-06 NOTE — Plan of Care (Signed)

## 2021-07-07 LAB — BPAM RBC
Blood Product Expiration Date: 202211122359
Blood Product Expiration Date: 202211142359
Unit Type and Rh: 600
Unit Type and Rh: 600

## 2021-07-07 LAB — TYPE AND SCREEN
ABO/RH(D): A NEG
Antibody Screen: NEGATIVE
Unit division: 0
Unit division: 0

## 2021-07-09 ENCOUNTER — Telehealth: Payer: Self-pay | Admitting: General Practice

## 2021-07-09 DIAGNOSIS — F419 Anxiety disorder, unspecified: Secondary | ICD-10-CM | POA: Diagnosis not present

## 2021-07-09 DIAGNOSIS — D631 Anemia in chronic kidney disease: Secondary | ICD-10-CM | POA: Diagnosis not present

## 2021-07-09 DIAGNOSIS — E785 Hyperlipidemia, unspecified: Secondary | ICD-10-CM | POA: Diagnosis not present

## 2021-07-09 DIAGNOSIS — N319 Neuromuscular dysfunction of bladder, unspecified: Secondary | ICD-10-CM | POA: Diagnosis not present

## 2021-07-09 DIAGNOSIS — Z466 Encounter for fitting and adjustment of urinary device: Secondary | ICD-10-CM | POA: Diagnosis not present

## 2021-07-09 DIAGNOSIS — N1831 Chronic kidney disease, stage 3a: Secondary | ICD-10-CM | POA: Diagnosis not present

## 2021-07-09 DIAGNOSIS — Z7982 Long term (current) use of aspirin: Secondary | ICD-10-CM | POA: Diagnosis not present

## 2021-07-09 DIAGNOSIS — L98493 Non-pressure chronic ulcer of skin of other sites with necrosis of muscle: Secondary | ICD-10-CM | POA: Diagnosis not present

## 2021-07-09 DIAGNOSIS — C679 Malignant neoplasm of bladder, unspecified: Secondary | ICD-10-CM | POA: Diagnosis not present

## 2021-07-09 NOTE — Telephone Encounter (Signed)
Transition Care Management Unsuccessful Follow-up Telephone Call  Date of discharge and from where:  07/06/21 from [facility]   Attempts:  1st Attempt  Reason for unsuccessful TCM follow-up call:  No answer/busy

## 2021-07-11 DIAGNOSIS — L98493 Non-pressure chronic ulcer of skin of other sites with necrosis of muscle: Secondary | ICD-10-CM | POA: Diagnosis not present

## 2021-07-11 DIAGNOSIS — Z7982 Long term (current) use of aspirin: Secondary | ICD-10-CM | POA: Diagnosis not present

## 2021-07-11 DIAGNOSIS — N319 Neuromuscular dysfunction of bladder, unspecified: Secondary | ICD-10-CM | POA: Diagnosis not present

## 2021-07-11 DIAGNOSIS — N1831 Chronic kidney disease, stage 3a: Secondary | ICD-10-CM | POA: Diagnosis not present

## 2021-07-11 DIAGNOSIS — Z466 Encounter for fitting and adjustment of urinary device: Secondary | ICD-10-CM | POA: Diagnosis not present

## 2021-07-11 DIAGNOSIS — E785 Hyperlipidemia, unspecified: Secondary | ICD-10-CM | POA: Diagnosis not present

## 2021-07-11 DIAGNOSIS — F419 Anxiety disorder, unspecified: Secondary | ICD-10-CM | POA: Diagnosis not present

## 2021-07-11 DIAGNOSIS — C679 Malignant neoplasm of bladder, unspecified: Secondary | ICD-10-CM | POA: Diagnosis not present

## 2021-07-11 DIAGNOSIS — D631 Anemia in chronic kidney disease: Secondary | ICD-10-CM | POA: Diagnosis not present

## 2021-07-11 NOTE — Telephone Encounter (Signed)
Transition Care Management Unsuccessful Follow-up Telephone Call  Date of discharge and from where:  07/06/21 from [facility]  Attempts:  2nd Attempt  Reason for unsuccessful TCM follow-up call:  No answer/busy

## 2021-07-13 DIAGNOSIS — N1831 Chronic kidney disease, stage 3a: Secondary | ICD-10-CM | POA: Diagnosis not present

## 2021-07-13 DIAGNOSIS — E785 Hyperlipidemia, unspecified: Secondary | ICD-10-CM | POA: Diagnosis not present

## 2021-07-13 DIAGNOSIS — L98493 Non-pressure chronic ulcer of skin of other sites with necrosis of muscle: Secondary | ICD-10-CM | POA: Diagnosis not present

## 2021-07-13 DIAGNOSIS — C679 Malignant neoplasm of bladder, unspecified: Secondary | ICD-10-CM | POA: Diagnosis not present

## 2021-07-13 DIAGNOSIS — F419 Anxiety disorder, unspecified: Secondary | ICD-10-CM | POA: Diagnosis not present

## 2021-07-13 DIAGNOSIS — Z7982 Long term (current) use of aspirin: Secondary | ICD-10-CM | POA: Diagnosis not present

## 2021-07-13 DIAGNOSIS — Z466 Encounter for fitting and adjustment of urinary device: Secondary | ICD-10-CM | POA: Diagnosis not present

## 2021-07-13 DIAGNOSIS — D631 Anemia in chronic kidney disease: Secondary | ICD-10-CM | POA: Diagnosis not present

## 2021-07-13 DIAGNOSIS — N319 Neuromuscular dysfunction of bladder, unspecified: Secondary | ICD-10-CM | POA: Diagnosis not present

## 2021-07-13 NOTE — Telephone Encounter (Signed)
Transition Care Management Unsuccessful Follow-up Telephone Call  Date of discharge and from where:  07/06/21 from [facility]  Attempts:  3rd Attempt  Reason for unsuccessful TCM follow-up call:  No answer/busy

## 2021-07-16 DIAGNOSIS — N319 Neuromuscular dysfunction of bladder, unspecified: Secondary | ICD-10-CM | POA: Diagnosis not present

## 2021-07-16 DIAGNOSIS — C679 Malignant neoplasm of bladder, unspecified: Secondary | ICD-10-CM | POA: Diagnosis not present

## 2021-07-16 DIAGNOSIS — Z466 Encounter for fitting and adjustment of urinary device: Secondary | ICD-10-CM | POA: Diagnosis not present

## 2021-07-16 DIAGNOSIS — E785 Hyperlipidemia, unspecified: Secondary | ICD-10-CM | POA: Diagnosis not present

## 2021-07-16 DIAGNOSIS — D631 Anemia in chronic kidney disease: Secondary | ICD-10-CM | POA: Diagnosis not present

## 2021-07-16 DIAGNOSIS — F419 Anxiety disorder, unspecified: Secondary | ICD-10-CM | POA: Diagnosis not present

## 2021-07-16 DIAGNOSIS — Z7982 Long term (current) use of aspirin: Secondary | ICD-10-CM | POA: Diagnosis not present

## 2021-07-16 DIAGNOSIS — N1831 Chronic kidney disease, stage 3a: Secondary | ICD-10-CM | POA: Diagnosis not present

## 2021-07-16 DIAGNOSIS — L98493 Non-pressure chronic ulcer of skin of other sites with necrosis of muscle: Secondary | ICD-10-CM | POA: Diagnosis not present

## 2021-07-17 ENCOUNTER — Encounter (HOSPITAL_COMMUNITY): Payer: Self-pay

## 2021-07-17 ENCOUNTER — Ambulatory Visit (HOSPITAL_COMMUNITY): Payer: BC Managed Care – PPO | Admitting: Hematology

## 2021-07-17 DIAGNOSIS — T8131XA Disruption of external operation (surgical) wound, not elsewhere classified, initial encounter: Secondary | ICD-10-CM | POA: Diagnosis not present

## 2021-07-18 ENCOUNTER — Telehealth: Payer: Self-pay | Admitting: *Deleted

## 2021-07-18 DIAGNOSIS — Z7982 Long term (current) use of aspirin: Secondary | ICD-10-CM | POA: Diagnosis not present

## 2021-07-18 DIAGNOSIS — C679 Malignant neoplasm of bladder, unspecified: Secondary | ICD-10-CM | POA: Diagnosis not present

## 2021-07-18 DIAGNOSIS — F419 Anxiety disorder, unspecified: Secondary | ICD-10-CM | POA: Diagnosis not present

## 2021-07-18 DIAGNOSIS — N1831 Chronic kidney disease, stage 3a: Secondary | ICD-10-CM | POA: Diagnosis not present

## 2021-07-18 DIAGNOSIS — T8131XA Disruption of external operation (surgical) wound, not elsewhere classified, initial encounter: Secondary | ICD-10-CM | POA: Diagnosis not present

## 2021-07-18 DIAGNOSIS — E785 Hyperlipidemia, unspecified: Secondary | ICD-10-CM | POA: Diagnosis not present

## 2021-07-18 DIAGNOSIS — L98493 Non-pressure chronic ulcer of skin of other sites with necrosis of muscle: Secondary | ICD-10-CM

## 2021-07-18 DIAGNOSIS — Z466 Encounter for fitting and adjustment of urinary device: Secondary | ICD-10-CM | POA: Diagnosis not present

## 2021-07-18 DIAGNOSIS — N319 Neuromuscular dysfunction of bladder, unspecified: Secondary | ICD-10-CM | POA: Diagnosis not present

## 2021-07-18 DIAGNOSIS — D631 Anemia in chronic kidney disease: Secondary | ICD-10-CM | POA: Diagnosis not present

## 2021-07-18 NOTE — Telephone Encounter (Signed)
[name] [facility] called to inform Dr. Madilyn Fireman that when she went in today to do his wound care she noticed that his wound has gotten worse.  She stated that he went into the hospital on 07/04/2021 for the surgery and that the CA was too far spread at this point. He was sent home she saw him on 07/09/2021 and he has a Wound vac in place. She stated that on 10/31 when she went back in to do wound care [name] informed her that he had a Large BM on Sunday  and so the wound vac was removed to clean the area and put the wound vac back on. She noted during this visit that the area had become darker (black,necrotic) in appearance and odorous. She returned today for his wound care and noted that the area has become more necrotic,the odor has gotten worse and he has more drainage than before. She did speak w/the patient and [name] (SO) about her observations and recommendations of getting him in with Wound care center for debridement of the area and that she would inform Dr. Madilyn Fireman of her recommendations also.  She wanted Dr. Madilyn Fireman to be aware that her office does offer Pallitive Care should he wish to move forward with doing this.    Called and spoke w/Michelle (SO) she stated that he is being practical and realistic about what's going on. She said that he has cancelled his last 2 appts with the surgeon and the Oncologist. She said that anything that he does just takes so much out of him and doubts that he would be interested in going to Wound care to have this area treated. He is more interested in his quality of life at this point.   Called pt to inform him of what the wound care nurse had advised [place] of at her visit with him today. I asked if he wanted [place] to place the referral for the wound care center he stated that he has no energy to do this and would like to proceed as he currently is which is wound care nurse 3x a week.[name] and informed [name] that Dr. Madilyn Fireman would like to get a  Wound CX done and that he doesn't wish to have referral for wound care center placed.     She will do this when she goes back on Friday to see patient.   Will fwd to pcp.

## 2021-07-19 ENCOUNTER — Telehealth: Payer: Self-pay

## 2021-07-19 DIAGNOSIS — T8131XA Disruption of external operation (surgical) wound, not elsewhere classified, initial encounter: Secondary | ICD-10-CM | POA: Diagnosis not present

## 2021-07-19 DIAGNOSIS — Z515 Encounter for palliative care: Secondary | ICD-10-CM

## 2021-07-19 MED ORDER — DOXYCYCLINE HYCLATE 100 MG PO TABS: 100.0000 mg | ORAL_TABLET | Freq: Two times a day (BID) | ORAL | 0 refills | Status: DC

## 2021-07-19 MED ORDER — CEPHALEXIN 500 MG PO CAPS: 500.0000 mg | ORAL_CAPSULE | Freq: Three times a day (TID) | ORAL | 0 refills | Status: DC

## 2021-07-19 NOTE — Telephone Encounter (Signed)
SW returned call to patient's significant other/PCG to schedule an initial palliative care visit. PCG declined palliative care services. She advised that patient is receiving services through [facility] and would rather continue through them for palliative and hospice services.   *Administrative staff updated for follow-up.

## 2021-07-19 NOTE — Telephone Encounter (Signed)
Antibiotics to start and we will know more once we have the wound culture results back if he wants to take the antibiotics I think moving forward with a palliative care consult is helpful.  This is a team of providers and nurses who can help make sure that he is as comfortable as possible and can attend to his needs even more closely and home health   Meds ordered this encounter  Medications   doxycycline (VIBRA-TABS) 100 MG tablet    Sig: Take 1 tablet (100 mg total) by mouth 2 (two) times daily.    Dispense:  20 tablet    Refill:  0   cephALEXin (KEFLEX) 500 MG capsule    Sig: Take 1 capsule (500 mg total) by mouth 3 (three) times daily.    Dispense:  30 capsule    Refill:  0

## 2021-07-20 DIAGNOSIS — L98493 Non-pressure chronic ulcer of skin of other sites with necrosis of muscle: Secondary | ICD-10-CM | POA: Diagnosis not present

## 2021-07-20 DIAGNOSIS — Z7982 Long term (current) use of aspirin: Secondary | ICD-10-CM | POA: Diagnosis not present

## 2021-07-20 DIAGNOSIS — C679 Malignant neoplasm of bladder, unspecified: Secondary | ICD-10-CM | POA: Diagnosis not present

## 2021-07-20 DIAGNOSIS — Z466 Encounter for fitting and adjustment of urinary device: Secondary | ICD-10-CM | POA: Diagnosis not present

## 2021-07-20 DIAGNOSIS — Z483 Aftercare following surgery for neoplasm: Secondary | ICD-10-CM | POA: Diagnosis not present

## 2021-07-20 DIAGNOSIS — E785 Hyperlipidemia, unspecified: Secondary | ICD-10-CM | POA: Diagnosis not present

## 2021-07-20 DIAGNOSIS — L89154 Pressure ulcer of sacral region, stage 4: Secondary | ICD-10-CM | POA: Diagnosis not present

## 2021-07-20 DIAGNOSIS — N319 Neuromuscular dysfunction of bladder, unspecified: Secondary | ICD-10-CM | POA: Diagnosis not present

## 2021-07-20 DIAGNOSIS — D631 Anemia in chronic kidney disease: Secondary | ICD-10-CM | POA: Diagnosis not present

## 2021-07-20 DIAGNOSIS — N1831 Chronic kidney disease, stage 3a: Secondary | ICD-10-CM | POA: Diagnosis not present

## 2021-07-20 DIAGNOSIS — T8131XA Disruption of external operation (surgical) wound, not elsewhere classified, initial encounter: Secondary | ICD-10-CM | POA: Diagnosis not present

## 2021-07-20 DIAGNOSIS — F419 Anxiety disorder, unspecified: Secondary | ICD-10-CM | POA: Diagnosis not present

## 2021-07-20 NOTE — Telephone Encounter (Signed)
[place] called and stated that she did the wound cx today. She also wanted VO for palliative/hospice care with their company. She stated that pt and [name] would like for them to continue with his care. VO given.

## 2021-07-21 DIAGNOSIS — T8131XA Disruption of external operation (surgical) wound, not elsewhere classified, initial encounter: Secondary | ICD-10-CM | POA: Diagnosis not present

## 2021-07-22 DIAGNOSIS — T8131XA Disruption of external operation (surgical) wound, not elsewhere classified, initial encounter: Secondary | ICD-10-CM | POA: Diagnosis not present

## 2021-07-23 DIAGNOSIS — C679 Malignant neoplasm of bladder, unspecified: Secondary | ICD-10-CM | POA: Diagnosis not present

## 2021-07-23 DIAGNOSIS — N1831 Chronic kidney disease, stage 3a: Secondary | ICD-10-CM | POA: Diagnosis not present

## 2021-07-23 DIAGNOSIS — N319 Neuromuscular dysfunction of bladder, unspecified: Secondary | ICD-10-CM | POA: Diagnosis not present

## 2021-07-23 DIAGNOSIS — F419 Anxiety disorder, unspecified: Secondary | ICD-10-CM | POA: Diagnosis not present

## 2021-07-23 DIAGNOSIS — Z7982 Long term (current) use of aspirin: Secondary | ICD-10-CM | POA: Diagnosis not present

## 2021-07-23 DIAGNOSIS — T8131XA Disruption of external operation (surgical) wound, not elsewhere classified, initial encounter: Secondary | ICD-10-CM | POA: Diagnosis not present

## 2021-07-23 DIAGNOSIS — L98493 Non-pressure chronic ulcer of skin of other sites with necrosis of muscle: Secondary | ICD-10-CM | POA: Diagnosis not present

## 2021-07-23 DIAGNOSIS — D631 Anemia in chronic kidney disease: Secondary | ICD-10-CM | POA: Diagnosis not present

## 2021-07-23 DIAGNOSIS — E785 Hyperlipidemia, unspecified: Secondary | ICD-10-CM | POA: Diagnosis not present

## 2021-07-23 DIAGNOSIS — Z466 Encounter for fitting and adjustment of urinary device: Secondary | ICD-10-CM | POA: Diagnosis not present

## 2021-07-24 ENCOUNTER — Telehealth: Payer: Self-pay | Admitting: *Deleted

## 2021-07-24 DIAGNOSIS — T8131XA Disruption of external operation (surgical) wound, not elsewhere classified, initial encounter: Secondary | ICD-10-CM | POA: Diagnosis not present

## 2021-07-24 MED ORDER — CIPROFLOXACIN HCL 500 MG PO TABS: 500.0000 mg | ORAL_TABLET | Freq: Two times a day (BID) | ORAL | 0 refills | Status: DC

## 2021-07-24 NOTE — Telephone Encounter (Signed)
Cathy w/[facility] called for VO to start wound care for 2 new sites on pt.  VO given. She advised me that we should be receiving a call from the palliative care division of their company soon to start services.

## 2021-07-24 NOTE — Telephone Encounter (Signed)
Pt informed.  Pt expressed understanding and is agreeable.  T. Darci Lykins, CMA  

## 2021-07-24 NOTE — Telephone Encounter (Signed)
Culture results shows Pseudomonas aeroguinosa.  Will start Cipro.  New rx sent.  OK to stop the keflex.

## 2021-07-25 DIAGNOSIS — N319 Neuromuscular dysfunction of bladder, unspecified: Secondary | ICD-10-CM | POA: Diagnosis not present

## 2021-07-25 DIAGNOSIS — L98493 Non-pressure chronic ulcer of skin of other sites with necrosis of muscle: Secondary | ICD-10-CM | POA: Diagnosis not present

## 2021-07-25 DIAGNOSIS — D631 Anemia in chronic kidney disease: Secondary | ICD-10-CM | POA: Diagnosis not present

## 2021-07-25 DIAGNOSIS — T8131XA Disruption of external operation (surgical) wound, not elsewhere classified, initial encounter: Secondary | ICD-10-CM | POA: Diagnosis not present

## 2021-07-25 DIAGNOSIS — Z466 Encounter for fitting and adjustment of urinary device: Secondary | ICD-10-CM | POA: Diagnosis not present

## 2021-07-25 DIAGNOSIS — C679 Malignant neoplasm of bladder, unspecified: Secondary | ICD-10-CM | POA: Diagnosis not present

## 2021-07-25 DIAGNOSIS — E785 Hyperlipidemia, unspecified: Secondary | ICD-10-CM | POA: Diagnosis not present

## 2021-07-25 DIAGNOSIS — F419 Anxiety disorder, unspecified: Secondary | ICD-10-CM | POA: Diagnosis not present

## 2021-07-25 DIAGNOSIS — Z7982 Long term (current) use of aspirin: Secondary | ICD-10-CM | POA: Diagnosis not present

## 2021-07-25 DIAGNOSIS — N1831 Chronic kidney disease, stage 3a: Secondary | ICD-10-CM | POA: Diagnosis not present

## 2021-07-26 DIAGNOSIS — T8131XA Disruption of external operation (surgical) wound, not elsewhere classified, initial encounter: Secondary | ICD-10-CM | POA: Diagnosis not present

## 2021-07-27 ENCOUNTER — Telehealth: Payer: Self-pay | Admitting: Family Medicine

## 2021-07-27 DIAGNOSIS — T8131XA Disruption of external operation (surgical) wound, not elsewhere classified, initial encounter: Secondary | ICD-10-CM | POA: Diagnosis not present

## 2021-07-27 DIAGNOSIS — A498 Other bacterial infections of unspecified site: Secondary | ICD-10-CM

## 2021-07-27 DIAGNOSIS — L98493 Non-pressure chronic ulcer of skin of other sites with necrosis of muscle: Secondary | ICD-10-CM

## 2021-07-27 DIAGNOSIS — Z22322 Carrier or suspected carrier of Methicillin resistant Staphylococcus aureus: Secondary | ICD-10-CM

## 2021-07-27 NOTE — Telephone Encounter (Signed)
Called and spoke with his partner Thomas Schroeder, we discussed that the culture results are showing significant resistance patterns for Pseudomonas and for MRSA.  We discussed options.  At this point I think I would need infectious disease to help [place].  He is not interested in going to a wound care center.  The wound is highly exudative with a brownish discharge.  A small percent of the wound has a gray and black appearance but is still soft.  The wound VAC was not staying on last night so it was removed completely and just doing wet-to-dry.  Will stop Cipro since bc of resistance.  We will go ahead and place infectious disease referral to assist with options for the treatment based on the culture report.  He is again not wanting to go back to the hospital and is not wanting a referral to wound care management that would require an in office visit.

## 2021-07-28 DIAGNOSIS — T8131XA Disruption of external operation (surgical) wound, not elsewhere classified, initial encounter: Secondary | ICD-10-CM | POA: Diagnosis not present

## 2021-07-29 DIAGNOSIS — T8131XA Disruption of external operation (surgical) wound, not elsewhere classified, initial encounter: Secondary | ICD-10-CM | POA: Diagnosis not present

## 2021-07-30 ENCOUNTER — Telehealth (HOSPITAL_COMMUNITY): Payer: Self-pay | Admitting: *Deleted

## 2021-07-30 DIAGNOSIS — T8131XA Disruption of external operation (surgical) wound, not elsewhere classified, initial encounter: Secondary | ICD-10-CM | POA: Diagnosis not present

## 2021-07-30 NOTE — Telephone Encounter (Signed)
Contact information for case manager for [facility] included in message.

## 2021-07-31 ENCOUNTER — Encounter: Payer: Self-pay | Admitting: Family Medicine

## 2021-07-31 DIAGNOSIS — T8131XA Disruption of external operation (surgical) wound, not elsewhere classified, initial encounter: Secondary | ICD-10-CM | POA: Diagnosis not present

## 2021-08-01 DIAGNOSIS — T8131XA Disruption of external operation (surgical) wound, not elsewhere classified, initial encounter: Secondary | ICD-10-CM | POA: Diagnosis not present

## 2021-08-02 DIAGNOSIS — T8131XA Disruption of external operation (surgical) wound, not elsewhere classified, initial encounter: Secondary | ICD-10-CM | POA: Diagnosis not present

## 2021-08-03 ENCOUNTER — Other Ambulatory Visit: Payer: Self-pay

## 2021-08-03 ENCOUNTER — Encounter: Payer: Self-pay | Admitting: Internal Medicine

## 2021-08-03 ENCOUNTER — Ambulatory Visit (INDEPENDENT_AMBULATORY_CARE_PROVIDER_SITE_OTHER): Payer: BC Managed Care – PPO | Admitting: Internal Medicine

## 2021-08-03 ENCOUNTER — Telehealth: Payer: Self-pay

## 2021-08-03 DIAGNOSIS — N1831 Chronic kidney disease, stage 3a: Secondary | ICD-10-CM | POA: Diagnosis not present

## 2021-08-03 DIAGNOSIS — M4628 Osteomyelitis of vertebra, sacral and sacrococcygeal region: Secondary | ICD-10-CM | POA: Diagnosis not present

## 2021-08-03 DIAGNOSIS — A4902 Methicillin resistant Staphylococcus aureus infection, unspecified site: Secondary | ICD-10-CM | POA: Diagnosis not present

## 2021-08-03 DIAGNOSIS — T8131XA Disruption of external operation (surgical) wound, not elsewhere classified, initial encounter: Secondary | ICD-10-CM | POA: Diagnosis not present

## 2021-08-03 DIAGNOSIS — C679 Malignant neoplasm of bladder, unspecified: Secondary | ICD-10-CM | POA: Diagnosis not present

## 2021-08-03 DIAGNOSIS — A498 Other bacterial infections of unspecified site: Secondary | ICD-10-CM

## 2021-08-03 NOTE — Telephone Encounter (Signed)
Per Garfield Cornea will see the patient Monday 11/21 for first home dose after 6pm, IV ABX will be delivered Monday between 4-6 pm. Astoria Condon T Brooks Sailors

## 2021-08-03 NOTE — Telephone Encounter (Signed)
I spoke Thomas Schroeder with [facility] and patient is scheduled with IR for picc placement on 08/06/21. Per patient care Center with [facility] they unable to do first dose on the same day as patient's appointment due to appointment availability.  I have reached out to [facility] and per Thomas Schroeder they will work on getting patient first dose in the home on 08/06/21.   Patient's significant other Thomas Schroeder is aware of patient's appointment info. I have also advised the patient that [facility] will reach out to them regarding first dose in home and getting medication out to the the patient. [facility] is also aware patient is using [facility] already for nursing.  I will also follow up with Thomas Schroeder on first dose as well.  Thomas Schroeder T Brooks Sailors

## 2021-08-03 NOTE — Assessment & Plan Note (Signed)
Per urology and oncology.

## 2021-08-03 NOTE — Assessment & Plan Note (Signed)
  Patient has a sacral decubitus ulcer with depth extending to palpable bone per recent wound care notes which would be consistent with a diagnosis of osteomyelitis.  Recent cultures obtained significant for MRSA and MDR Pseudomonas aeruginosa with no available oral treatment options for his Pseudomonas.  Discussed that he would require PICC line placement and IV antibiotics to adequately treat this infection. Also, discussed other keys to sacral wound care such as WOC, nutrition, offloading at home, and diversion.   He states that he is agreeable to IV antibiotics and PICC line.  Will check labs today and plan Daptomcyin 500 mg (19m/kg) IV q24h and Cefepime 2g IV q12h.  IR order placed for PICC.  Diagnosis: Sacral OM  Culture Result: PsA, MRSA  Allergies  Allergen Reactions  . Bactrim Rash    Skin sloughing   . Ropinirole Other (See Comments)    Increased leg spasms/poor sleep.   . Latex Rash    OPAT Orders Discharge antibiotics to be given via PICC line Discharge antibiotics: Daptomcyin 500 mg (864mkg) IV q24h and Cefepime 2g IV q12h  Duration: 6 weeks from PICC line placement  [facility]  [facility] health RN for IV administration and teaching; PICC line care and labs.    Labs weekly while on IV antibiotics: _xx_ CBC with differential __ BMP _xx_ CMP _xx_ CRP _xx_ ESR __ Vancomycin trough _xx_ CK  _xx_ Please pull PIC at completion of IV antibiotics __ Please leave PIC in place until doctor has seen patient or been notified  Fax weekly labs to [phone] Follow Up Appt: 4 weeks

## 2021-08-03 NOTE — Patient Instructions (Signed)
Thank you for coming to see me today. It was a pleasure seeing you.  To Do: Labs today Plan for IV antibiotics via PICC line for 6 weeks to treat bone infection I placed referral to wound center today  If you have any questions or concerns, please do not hesitate to call the office at [phone].  Take Care,   Jule Ser

## 2021-08-03 NOTE — Progress Notes (Signed)
[place] for Infectious Disease  Reason for Consult:Sacral ulcer  Referring Provider: Dr Madilyn Fireman   HPI:    Thomas Schroeder is a 60 y.o. male with PMHx as below who presents to the clinic for sacral decubitus ulcer.   Patient has a history of metastatic and unresectable bladder cancer followed by urology, stage III CKD, right malignant ureteral obstruction, and chronic sacral decubitus ulcer who is referred to our clinic for further antibiotic recommendations in the setting of MDR Pseudomonas aeruginosa and MRSA growing from recent wound culture.    He was recently admitted to the hospital from 07/03/2021 through 07/06/2021.  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  Of note, they noted palpable bone, pink wound bed, and minimal amount of yellow drainage in the canister.  His wound care nurse at home obtained a wound culture which grew fluoroquinolone resistant Pseudomonas and MRSA.  He was referred to our clinic for further evaluation and his wife reports that he is currently on Cipro and they have stopped the doxycycline.  He reports no fevers, chills.  Wound is draining a lot still and they are trying to keep area free of urine and stool.  No diarrhea.  Creatinine clearance:  ~47  Patient's Medications  New Prescriptions   No medications on file  Previous Medications   ACETAMINOPHEN (TYLENOL) 500 MG TABLET    Take 1,000 mg by mouth every 6 (six) hours as needed for moderate pain or headache.   ASPIRIN EC 325 MG TABLET    Take 325 mg by mouth daily.   ATORVASTATIN (LIPITOR) 20 MG TABLET    TAKE 1 TABLET AT BEDTIME   CRANBERRY EXTRACT PO    Take 4,200 mg by mouth daily.   DIAZEPAM (VALIUM) 10 MG TABLET    Take 10-30 mg by mouth daily as needed for anxiety.   MULTIPLE VITAMIN (MULTI VITAMIN MENS PO)    Take 1 tablet by mouth daily.    OMEGA-3 FATTY ACIDS (FISH OIL) 1200 MG CAPS    Take 1,200 mg by mouth daily.  Modified Medications   No medications on file  Discontinued Medications   CIPROFLOXACIN (CIPRO) 500 MG TABLET    Take 500 mg by mouth 2 (two) times daily.   DOXYCYCLINE (VIBRA-TABS) 100 MG TABLET    Take 1 tablet (100 mg total) by mouth 2 (two) times daily.   OXYCODONE-ACETAMINOPHEN (PERCOCET) 5-325 MG TABLET    Take 1-2 tablets by mouth every 6 (six) hours as needed for severe pain. Post-operatively      Past Medical History:  Diagnosis Date   Allergy    Chronic indwelling Foley catheter    Chronic kidney disease    ED (erectile dysfunction)    History of recurrent UTIs    Night muscle spasms    Paraplegia (HCC)     Social History   Tobacco Use   Smoking status: Never   Smokeless tobacco: Never  Vaping Use   Vaping Use: Never used  Substance Use Topics   Alcohol use: Yes    Alcohol/week: 10.0 standard drinks    Types: 10 Standard drinks or equivalent per week    Comment: per week   Drug use: Not Currently    Family History  Problem Relation Age of Onset   Hyperlipidemia Mother    Diabetes Mother    Rosacea  Mother    Lung cancer Mother        smoker   Hypertension Neg Hx     Allergies  Allergen Reactions   Bactrim Rash    Skin sloughing    Ropinirole Other (See Comments)    Increased leg spasms/poor sleep.    Latex Rash    Review of Systems  All other systems reviewed and are negative. Except as noted above.     OBJECTIVE:    There were no vitals filed for this visit.   There is no height or weight on file to calculate BMI.  Physical Exam Constitutional:      General: He is not in acute distress. HENT:     Head: Normocephalic and atraumatic.  Pulmonary:     Effort: Pulmonary effort is normal. No respiratory distress.  Genitourinary:    Comments: Nephrostomy tube in place. Large left ischial wound that is malodorous and draining.  Neurological:     Mental Status: He  is alert.      Labs and Microbiology:  CBC Latest Ref Rng & Units 07/05/2021 07/04/2021 07/03/2021  WBC 4.0 - 10.5 K/uL - - 10.3  Hemoglobin 13.0 - 17.0 g/dL 8.2(L) 8.9(L) 9.7(L)  Hematocrit 39.0 - 52.0 % 27.2(L) 29.4(L) 33.1(L)  Platelets 150 - 400 K/uL - - 381   CMP Latest Ref Rng & Units 07/05/2021 07/03/2021 06/12/2021  Glucose 70 - 99 mg/dL 171(H) 100(H) 215(H)  BUN 8 - 23 mg/dL 22 27(H) 30(H)  Creatinine 0.61 - 1.24 mg/dL 1.55(H) 1.53(H) 1.66(H)  Sodium 135 - 145 mmol/L 132(L) 136 131(L)  Potassium 3.5 - 5.1 mmol/L 4.7 3.8 4.2  Chloride 98 - 111 mmol/L 107 106 100  CO2 22 - 32 mmol/L 18(L) 23 21(L)  Calcium 8.9 - 10.3 mg/dL 10.6(H) 12.0(H) 11.5(H)  Total Protein 6.5 - 8.1 g/dL - 7.5 -  Total Bilirubin 0.3 - 1.2 mg/dL - 0.4 -  Alkaline Phos 38 - 126 U/L - 184(H) -  AST 15 - 41 U/L - 22 -  ALT 0 - 44 U/L - 35 -      ASSESSMENT & PLAN:    Sacral osteomyelitis (HCC)  Patient has a sacral decubitus ulcer with depth extending to palpable bone per recent wound care notes which would be consistent with a diagnosis of osteomyelitis.  Recent cultures obtained significant for MRSA and MDR Pseudomonas aeruginosa with no available oral treatment options for his Pseudomonas.  Discussed that he would require PICC line placement and IV antibiotics to adequately treat this infection. Also, discussed other keys to sacral wound care such as WOC, nutrition, offloading at home, and diversion.   He states that he is agreeable to IV antibiotics and PICC line.  Will check labs today and plan Daptomcyin 500 mg (71m/kg) IV q24h and Cefepime 2g IV q12h.  IR order placed for PICC.  Diagnosis: Sacral OM  Culture Result: PsA, MRSA  Allergies  Allergen Reactions   Bactrim Rash    Skin sloughing    Ropinirole Other (See Comments)    Increased leg spasms/poor sleep.    Latex Rash    OPAT Orders Discharge antibiotics to be given via PICC line Discharge antibiotics: Daptomcyin 500 mg  (818mkg) IV q24h and Cefepime 2g IV q12h  Duration: 6 weeks from PICC line placement  [facility]  Home health RN for IV administration and teaching; PICC line care and labs.    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    Stage 3a chronic kidney disease (HCC) Daptomycin and Cefepime will be dosed accordingly.    Orders Placed This Encounter  Procedures   IR PICC PLACEMENT LEFT >5 YRS INC IMG GUIDE    Standing Status:   Future    Standing Expiration Date:   08/03/2022    Order Specific Question:   Reason for Exam (SYMPTOM  OR DIAGNOSIS REQUIRED)    Answer:   IV antibiotics    Order Specific Question:   Preferred Imaging Location?    Answer:   [facility]   CBC   COMPLETE METABOLIC PANEL WITH GFR   CK (Creatine Kinase)   Sedimentation rate   C-reactive protein   AMB referral to wound care center    Referral Priority:   Routine    Referral Type:   Consultation    Number of Visits Requested:   [place] for Infectious Disease [facility] 08/03/2021, 2:18 PM

## 2021-08-03 NOTE — Assessment & Plan Note (Signed)
Daptomycin and Cefepime will be dosed accordingly.

## 2021-08-03 NOTE — Telephone Encounter (Signed)
I attempted to contact [place] with IR at [facility]. I was unable to get in contact with Tiffany and a message was left for her to call me back.  Patient has an appointment with IR on 08/06/21 for a different procedure and would also like to get his picc placed the same day if possible. Patient will also need short stay for first dose. Machi Whittaker T Brooks Sailors

## 2021-08-04 ENCOUNTER — Encounter: Payer: Self-pay | Admitting: Internal Medicine

## 2021-08-04 ENCOUNTER — Emergency Department (HOSPITAL_COMMUNITY)
Admission: EM | Admit: 2021-08-04 | Discharge: 2021-08-04 | Disposition: A | Discharge status: Home / Self Care | Payer: BC Managed Care – PPO | Attending: Emergency Medicine | Admitting: Emergency Medicine

## 2021-08-04 DIAGNOSIS — Z79899 Other long term (current) drug therapy: Secondary | ICD-10-CM | POA: Insufficient documentation

## 2021-08-04 DIAGNOSIS — T8131XA Disruption of external operation (surgical) wound, not elsewhere classified, initial encounter: Secondary | ICD-10-CM | POA: Diagnosis not present

## 2021-08-04 DIAGNOSIS — R739 Hyperglycemia, unspecified: Secondary | ICD-10-CM | POA: Diagnosis not present

## 2021-08-04 DIAGNOSIS — Z91040 Latex allergy status: Secondary | ICD-10-CM | POA: Insufficient documentation

## 2021-08-04 DIAGNOSIS — Z7982 Long term (current) use of aspirin: Secondary | ICD-10-CM | POA: Diagnosis not present

## 2021-08-04 DIAGNOSIS — R9431 Abnormal electrocardiogram [ECG] [EKG]: Secondary | ICD-10-CM | POA: Diagnosis not present

## 2021-08-04 LAB — COMPREHENSIVE METABOLIC PANEL
ALT: 21 U/L (ref 0–44)
ALT: 20 U/L (ref 0–44)
AST: 17 U/L (ref 15–41)
AST: 16 U/L (ref 15–41)
Albumin: 2.5 g/dL — ABNORMAL LOW (ref 3.5–5.0)
Albumin: 2.4 g/dL — ABNORMAL LOW (ref 3.5–5.0)
Alkaline Phosphatase: 195 U/L — ABNORMAL HIGH (ref 38–126)
Alkaline Phosphatase: 173 U/L — ABNORMAL HIGH (ref 38–126)
Anion gap: 8 (ref 5–15)
Anion gap: 7 (ref 5–15)
BUN: 42 mg/dL — ABNORMAL HIGH (ref 8–23)
BUN: 39 mg/dL — ABNORMAL HIGH (ref 8–23)
CO2: 23 mmol/L (ref 22–32)
CO2: 21 mmol/L — ABNORMAL LOW (ref 22–32)
Calcium: 14.5 mg/dL (ref 8.9–10.3)
Calcium: 13.1 mg/dL (ref 8.9–10.3)
Chloride: 102 mmol/L (ref 98–111)
Chloride: 107 mmol/L (ref 98–111)
Creatinine, Ser: 1.97 mg/dL — ABNORMAL HIGH (ref 0.61–1.24)
Creatinine, Ser: 1.93 mg/dL — ABNORMAL HIGH (ref 0.61–1.24)
GFR, Estimated: 38 mL/min — ABNORMAL LOW (ref >60)
GFR, Estimated: 39 mL/min — ABNORMAL LOW (ref >60)
Glucose, Bld: 129 mg/dL — ABNORMAL HIGH (ref 70–99)
Glucose, Bld: 109 mg/dL — ABNORMAL HIGH (ref 70–99)
Potassium: 4.5 mmol/L (ref 3.5–5.1)
Potassium: 4.6 mmol/L (ref 3.5–5.1)
Sodium: 133 mmol/L — ABNORMAL LOW (ref 135–145)
Sodium: 135 mmol/L (ref 135–145)
Total Bilirubin: 0.3 mg/dL (ref 0.3–1.2)
Total Bilirubin: 0.3 mg/dL (ref 0.3–1.2)
Total Protein: 7.6 g/dL (ref 6.5–8.1)
Total Protein: 7 g/dL (ref 6.5–8.1)

## 2021-08-04 LAB — CBC WITH DIFFERENTIAL/PLATELET
Abs Immature Granulocytes: 0.14 10*3/uL — ABNORMAL HIGH (ref 0.00–0.07)
Basophils Absolute: 0.1 10*3/uL (ref 0.0–0.1)
Basophils Relative: 1 %
Eosinophils Absolute: 0.3 10*3/uL (ref 0.0–0.5)
Eosinophils Relative: 2 %
HCT: 30.7 % — ABNORMAL LOW (ref 39.0–52.0)
Hemoglobin: 8.9 g/dL — ABNORMAL LOW (ref 13.0–17.0)
Immature Granulocytes: 1 %
Lymphocytes Relative: 10 %
Lymphs Abs: 1.4 10*3/uL (ref 0.7–4.0)
MCH: 23.5 pg — ABNORMAL LOW (ref 26.0–34.0)
MCHC: 29 g/dL — ABNORMAL LOW (ref 30.0–36.0)
MCV: 81.2 fL (ref 80.0–100.0)
Monocytes Absolute: 1.4 10*3/uL — ABNORMAL HIGH (ref 0.1–1.0)
Monocytes Relative: 10 %
Neutro Abs: 10.9 10*3/uL — ABNORMAL HIGH (ref 1.7–7.7)
Neutrophils Relative %: 76 %
Platelets: 519 10*3/uL — ABNORMAL HIGH (ref 150–400)
RBC: 3.78 MIL/uL — ABNORMAL LOW (ref 4.22–5.81)
RDW: 16.3 % — ABNORMAL HIGH (ref 11.5–15.5)
WBC: 14.3 10*3/uL — ABNORMAL HIGH (ref 4.0–10.5)
nRBC: 0 % (ref 0.0–0.2)

## 2021-08-04 LAB — COMPLETE METABOLIC PANEL WITH GFR
AG Ratio: 0.8 (calc) — ABNORMAL LOW (ref 1.0–2.5)
ALT: 18 U/L (ref 9–46)
AST: 14 U/L (ref 10–35)
Albumin: 2.9 g/dL — ABNORMAL LOW (ref 3.6–5.1)
Alkaline phosphatase (APISO): 207 U/L — ABNORMAL HIGH (ref 35–144)
BUN/Creatinine Ratio: 22 (calc) (ref 6–22)
BUN: 48 mg/dL — ABNORMAL HIGH (ref 7–25)
CO2: 25 mmol/L (ref 20–32)
Calcium: 14.7 mg/dL (ref 8.6–10.3)
Chloride: 102 mmol/L (ref 98–110)
Creat: 2.22 mg/dL — ABNORMAL HIGH (ref 0.70–1.35)
Globulin: 3.7 g/dL (calc) (ref 1.9–3.7)
Glucose, Bld: 92 mg/dL (ref 65–99)
Potassium: 5.3 mmol/L (ref 3.5–5.3)
Sodium: 135 mmol/L (ref 135–146)
Total Bilirubin: 0.2 mg/dL (ref 0.2–1.2)
Total Protein: 6.6 g/dL (ref 6.1–8.1)
eGFR: 33 mL/min/{1.73_m2} — ABNORMAL LOW (ref >=60)

## 2021-08-04 LAB — CBC
HCT: 29.3 % — ABNORMAL LOW (ref 38.5–50.0)
Hemoglobin: 9 g/dL — ABNORMAL LOW (ref 13.2–17.1)
MCH: 23.7 pg — ABNORMAL LOW (ref 27.0–33.0)
MCHC: 30.7 g/dL — ABNORMAL LOW (ref 32.0–36.0)
MCV: 77.1 fL — ABNORMAL LOW (ref 80.0–100.0)
MPV: 8.5 fL (ref 7.5–12.5)
Platelets: 547 10*3/uL — ABNORMAL HIGH (ref 140–400)
RBC: 3.8 10*6/uL — ABNORMAL LOW (ref 4.20–5.80)
RDW: 15 % (ref 11.0–15.0)
WBC: 15.4 10*3/uL — ABNORMAL HIGH (ref 3.8–10.8)

## 2021-08-04 LAB — CK: Total CK: 16 U/L — ABNORMAL LOW (ref 44–196)

## 2021-08-04 LAB — C-REACTIVE PROTEIN: CRP: 103.9 mg/L — ABNORMAL HIGH (ref <8.0)

## 2021-08-04 LAB — SEDIMENTATION RATE: Sed Rate: 101 mm/h — ABNORMAL HIGH (ref 0–20)

## 2021-08-04 LAB — MAGNESIUM: Magnesium: 2.2 mg/dL (ref 1.7–2.4)

## 2021-08-04 MED ORDER — SODIUM CHLORIDE 0.9 % IV BOLUS
1000.0000 mL | Freq: Once | INTRAVENOUS | Status: AC
  Administered 2021-08-04: 1000 mL via INTRAVENOUS

## 2021-08-04 MED ORDER — CALCITONIN (SALMON) 200 UNIT/ML IJ SOLN
4.0000 [IU]/kg | Freq: Once | INTRAMUSCULAR | Status: AC
  Administered 2021-08-04: 262 [IU] via INTRAMUSCULAR
  Filled 2021-08-04: qty 1.31

## 2021-08-04 MED ORDER — ZOLEDRONIC ACID 4 MG/5ML IV CONC
4.0000 mg | Freq: Once | INTRAVENOUS | Status: AC
  Administered 2021-08-04: 4 mg via INTRAVENOUS
  Filled 2021-08-04: qty 5

## 2021-08-04 MED ORDER — ONDANSETRON HCL 4 MG/2ML IJ SOLN
4.0000 mg | Freq: Once | INTRAMUSCULAR | Status: AC
  Administered 2021-08-04: 4 mg via INTRAVENOUS
  Filled 2021-08-04: qty 2

## 2021-08-04 NOTE — ED Provider Notes (Signed)
[facility] DEPT Provider Note   CSN: [id number] Arrival date & time: 08/04/21  0830     History Chief Complaint  Patient presents with   abnormal labs    Hypercalcemia     Thomas Schroeder is a 60 y.o. male with history of paraplegia, stage III chronic kidney disease, and bladder cancer who presents to the ED after abnormal lab at infectious disease appointment yesterday.  Patient has been seen in infectious disease for sacral osteomyelitis secondary to MRSA infection, patient has appointment scheduled for Monday for PICC line placement and IV antibiotics.  Routine labs done at infectious disease yesterday was significant for calcium level of 14.7, patient was called and told to present to the emergency department immediately.  Family member at bedside states that patient has been more confused as of late, but she had related it to his progressive cancer and treatment.  HPI     Past Medical History:  Diagnosis Date   Allergy    Chronic indwelling Foley catheter    Chronic kidney disease    ED (erectile dysfunction)    History of recurrent UTIs    Night muscle spasms    Paraplegia [facility])     Patient Active Problem List   Diagnosis Date Noted   Sacral osteomyelitis ([place]) 08/03/2021   MRSA infection 08/03/2021   Pseudomonas aeruginosa infection 08/03/2021   Bladder cancer ([place]) 07/04/2021   Stage 3a chronic kidney disease ([place]) 05/17/2021   Normochromic anemia 05/17/2021   Urothelial carcinoma of bladder ([place]) 05/17/2021   Shock circulatory ([place]) 05/07/2021   Pressure injury of skin 04/06/2021   Obstructive uropathy 04/05/2021   Grade II internal hemorrhoids    Rectal polyp    Rectal ulcer    Colon cancer screening 01/16/2021   Positive colorectal cancer screening using Cologuard test 01/16/2021   Hip fracture ([place]) 06/23/2020   Closed comminuted intertrochanteric fracture of left femur ([place]) 06/23/2020   Muscle spasticity 87/56/4332    Complicated UTI (urinary tract infection) 04/20/2020   Severe sepsis with septic shock ([place]) 04/20/2020   Alcohol use 04/20/2020   Anxiety 04/20/2020   Hyperlipidemia 06/16/2015   Recurrent kidney stones 08/04/2014   History of decubitus ulcer 10/18/2013   Male erectile disorder 10/02/2012   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   Perineal ulcer, limited to breakdown of skin ([place]) 09/04/2011   Microscopic colitis 04/12/2011   Nonspecific findings on examination of blood 03/30/2009   Paraplegia ([place]) 03/13/2009   Osteopenia 03/13/2009   Bone/cartilage disorder 03/13/2009    Past Surgical History:  Procedure Laterality Date   APPENDECTOMY     BIOPSY  03/22/2021   Procedure: BIOPSY;  Surgeon: Lavena Bullion, DO;  Location: [facility] ENDOSCOPY;  Service: Gastroenterology;;   COLONOSCOPY N/A 03/22/2021   Procedure: COLONOSCOPY;  Surgeon: Lavena Bullion, DO;  Location: [facility] ENDOSCOPY;  Service: Gastroenterology;  Laterality: N/A;   CYSTOSCOPY W/ RETROGRADES Bilateral 04/05/2021   Procedure: CYSTOSCOPY;  Surgeon: Cleon Gustin, MD;  Location: [facility] ORS;  Service: Urology;  Laterality: Bilateral;   CYSTOSCOPY WITH INJECTION N/A 07/04/2021   Procedure: CYSTOSCOPY WITH INJECTION OF INDOCYANINE GREEN DYE;  Surgeon: Alexis Frock, MD;  Location: [facility] ORS;  Service: Urology;  Laterality: N/A;   FEMUR FRACTURE SURGERY     INTRAMEDULLARY (IM) NAIL INTERTROCHANTERIC Left 06/24/2020   Procedure: INTRAMEDULLARY (IM) NAIL FEMORAL;  Surgeon: Mcarthur Rossetti, MD;  Location: [facility] ORS;  Service: Orthopedics;  Laterality: Left;   IR NEPHROSTOMY EXCHANGE RIGHT  05/14/2021   IR NEPHROSTOMY PLACEMENT RIGHT  04/06/2021   IR US GUIDE VASC ACCESS LEFT  04/06/2021   kidney stone removal     POLYPECTOMY  03/22/2021   Procedure: POLYPECTOMY;  Surgeon: Lavena Bullion, DO;  Location: [facility] ENDOS[facility];  Service: Gastroenterology;;   ROBOT ASSITED LAPAROSCOPIC NEPHROURETERECTOMY Left 07/04/2021    Procedure: XI ROBOT ASSITED LAPAROSCOPIC NEPHROURETERECTOMY;  Surgeon: Alexis Frock, MD;  Location: [facility] ORS;  Service: Urology;  Laterality: Left;   ROBOTIC ASSISTED LAPAROSCOPIC LYSIS OF ADHESION  07/04/2021   Procedure: XI ROBOTIC ASSISTED LAPAROSCOPIC LYSIS OF ADHESION AND BLADDER BIOPSY;  Surgeon: Alexis Frock, MD;  Location: [facility]S;  Service: Urology;;   RT tibia fracture     T8 T9 fusion      TONSILLECTOMY     TRANSURETHRAL RESECTION OF BLADDER TUMOR N/A 04/05/2021   Procedure: TRANSURETHRAL RESECTION OF BLADDER TUMOR (TURBT);  Surgeon: Cleon Gustin, MD;  Location: [facility];  Service: Urology;  Laterality: N/A;       Family History  Problem Relation Age of Onset   Hyperlipidemia Mother    Diabetes Mother    Rosacea Mother    Lung cancer Mother        smoker   Hypertension Neg Hx     Social History   Tobacco Use   Smoking status: Never   Smokeless tobacco: Never  Vaping Use   Vaping Use: Never used  Substance Use Topics   Alcohol use: Yes    Alcohol/week: 10.0 standard drinks    Types: 10 Standard drinks or equivalent per week    Comment: per week   Drug use: Not Currently    Home Medications Prior to Admission medications   Medication Sig Start Date End Date Taking? Authorizing Provider  acetaminophen (TYLENOL) 500 MG tablet Take 1,000 mg by mouth every 6 (six) hours as needed for moderate pain or headache.    [provider]  aspirin EC 325 MG tablet Take 325 mg by mouth daily.    [provider]  atorvastatin (LIPITOR) 20 MG tablet TAKE 1 TABLET AT BEDTIME Patient taking differently: Take 20 mg by mouth daily. 11/21/20   Hali Marry, MD  CRANBERRY EXTRACT PO Take 4,200 mg by mouth daily.    [provider]  diazepam (VALIUM) 10 MG tablet Take 10-30 mg by mouth daily as needed for anxiety.    [provider]  Multiple Vitamin (MULTI VITAMIN MENS PO) Take 1 tablet by mouth daily.    [provider]   Omega-3 Fatty Acids (FISH OIL) 1200 MG CAPS Take 1,200 mg by mouth daily.    [provider]    Allergies    Bactrim, Ropinirole, and Latex  Review of Systems   Review of Systems  Constitutional:  Negative for chills and fever.  Musculoskeletal:  Positive for arthralgias, back pain and myalgias.  Skin:  Positive for wound.  Psychiatric/Behavioral:  Positive for confusion.   All other systems reviewed and are negative.  Physical Exam Updated Vital Signs BP (!) 104/59   Pulse 92   Temp 97.6 F (36.4 C) (Oral)   Resp 13   Ht 5\' 6"  (1.676 m)   Wt 65.3 kg   SpO2 100%   BMI 23.24 kg/m   Physical Exam Vitals and nursing note reviewed.  Constitutional:      Appearance: Normal appearance.  HENT:     Head: Normocephalic and atraumatic.  Eyes:     Conjunctiva/sclera: Conjunctivae normal.  Cardiovascular:     Rate and Rhythm: Normal rate and regular rhythm.  Pulmonary:     Effort: Pulmonary effort is normal. No respiratory distress.     Breath sounds: Normal breath sounds.  Abdominal:     General: There is no distension.     Palpations: Abdomen is soft.     Tenderness: There is no abdominal tenderness.  Skin:    General: Skin is warm and dry.  Neurological:     General: No focal deficit present.     Mental Status: He is alert.    ED Results / Procedures / Treatments   Labs (all labs ordered are listed, but only abnormal results are displayed) Labs Reviewed  CBC WITH DIFFERENTIAL/PLATELET - Abnormal; Notable for the following components:      Result Value   WBC 14.3 (*)    RBC 3.78 (*)    Hemoglobin 8.9 (*)    HCT 30.7 (*)    MCH 23.5 (*)    MCHC 29.0 (*)    RDW 16.3 (*)    Platelets 519 (*)    Neutro Abs 10.9 (*)    Monocytes Absolute 1.4 (*)    Abs Immature Granulocytes 0.14 (*)    All other components within normal limits  COMPREHENSIVE METABOLIC PANEL - Abnormal; Notable for the following components:   Sodium 133 (*)    Glucose, Bld 129 (*)     BUN 42 (*)    Creatinine, Ser 1.97 (*)    Calcium 14.5 (*)    Albumin 2.5 (*)    Alkaline Phosphatase 195 (*)    GFR, Estimated 38 (*)    All other components within normal limits  MAGNESIUM  COMPREHENSIVE METABOLIC PANEL    EKG EKG Interpretation  Date/Time:  Saturday August 04 2021 12:51:56 EST Ventricular Rate:  96 PR Interval:  134 QRS Duration: 78 QT Interval:  324 QTC Calculation: 410 R Axis:   65 Text Interpretation: Sinus rhythm Probable anteroseptal infarct, old Minimal ST depression, inferior leads No change Confirmed by Lavenia Atlas [phone]) on 08/04/2021 1:17:18 PM  Radiology No results found.  Procedures .Critical Care Performed by: Kateri Plummer, PA-C Authorized by: Kateri Plummer, PA-C   Critical care provider statement:    Critical care time (minutes):  30   Critical care was time spent personally by me on the following activities:  Development of treatment plan with patient or surrogate, discussions with consultants, evaluation of patient's response to treatment, examination of patient, ordering and review of laboratory studies, ordering and review of radiographic studies, ordering and performing treatments and interventions, pulse oximetry, re-evaluation of patient's condition and review of old charts   Medications Ordered in ED Medications  sodium chloride 0.9 % bolus 1,000 mL (1,000 mLs Intravenous New Bag/Given 08/04/21 1435)  calcitonin (MIACALCIN) injection 262 Units (262 Units Intramuscular Given 08/04/21 1533)  zoledronic acid (ZOMETA) 4 mg in sodium chloride 0.9 % 100 mL IVPB (0 mg Intravenous Stopped 08/04/21 1644)    ED Course  I have reviewed the triage vital signs and the nursing notes.  Pertinent labs & imaging results that were available during my care of the patient were reviewed by me and considered in my medical decision making (see chart for details).    MDM Rules/Calculators/A&P                           Patient  60 year old male with history of chronic kidney disease and  bladder cancer who presents to the emergency department after lab work done at infectious disease appointment yesterday showed a calcium of 14.7.  He is being seen by infectious disease for sacral osteomyelitis secondary to MRSA infection, has appointment on Monday scheduled for PICC line placement and IV antibiotic initiation.  On exam patient has no complaints, he appears chronically ill. He is afebrile, not tachycardic, and in no acute distress. Repeat lab work showed calcium of 14.5, with normal magnesium. No EKG changes. Corrected calcium in the setting of hypoalbuminemia continues to be > 14. Compared to yesterday's labs there is slight creatinine improvement from yesterday from 2.22 to 1.97 respectively.  Leukocytosis improved from yesterday.   3:11pm Initiated IVF, calcitonin and bisphosphonates per pharmacy.  6:00pm Patient to have repeat labs completed at 7:30pm. If there is any decrease in calcium level, and patient remains in stable condition, anticipate he can discharge to home with repeat labs on Monday at infectious disease appointment. If there is no change in serum calcium, patient will likely require renal consult and admission for dialysis. Patient discussed and care transferred to oncoming provider Deatra Canter PA-C at shift change. Refer to his note for disposition.   I discussed this case with my attending physician Dr. Dina Rich who cosigned this note including patient's presenting symptoms, physical exam, and planned diagnostics and interventions. Attending physician stated agreement with plan or made changes to plan which were implemented.    Final Clinical Impression(s) / ED Diagnoses Final diagnoses:  Hypercalcemia    Rx / DC Orders ED Discharge Orders     None        Lancelot Alyea T, PA-C 08/04/21 1746    Horton, Alvin Critchley, DO 08/05/21 0725

## 2021-08-04 NOTE — ED Triage Notes (Signed)
Patient reports he got call from pcp stating calcium level was too high and to come to ED, patient does not know what level was reported., denies pain.

## 2021-08-04 NOTE — ED Notes (Signed)
Calcium 14.5, Pa notified.

## 2021-08-04 NOTE — Progress Notes (Signed)
Paged per calcium 14.7. Called pt and spoke with wife and pt. Wife stated pt is confused. Counselled to go to the ED.

## 2021-08-04 NOTE — ED Provider Notes (Signed)
Signout received on 60 year old male with history of paraplegia, CKD, bladder cancer who presented to the ED with elevated calcium level.  Patient's calcium level on arrival was 14.5.  Patient received fluids, calcitonin, bisphosphonate and plan was to repeat calcium level at 1930 and if level improved and downtrending patient would be appropriate for discharge but if remained the same or increased he would require nephrology consult for potential emergent dialysis.  Physical Exam  BP 105/83   Pulse 93   Temp 97.6 F (36.4 C) (Oral)   Resp 19   Ht 5\' 6"  (1.676 m)   Wt 65.3 kg   SpO2 99%   BMI 23.24 kg/m     ED Course/Procedures      MDM  Repeat labs show a calcium level of 13.1 which is improved from 14.5.  Per signout criteria patient is appropriate for discharge.  Patient does have follow-up with infectious disease for Monday 11/21 where he can have repeat labs.  Discussed importance of follow-up with PCP the patient and his wife.  They voiced understanding and are in agreement with the plan.  Return precautions discussed.       Evlyn Courier, PA-C 08/04/21 2115    Truddie Hidden, MD 08/04/21 [phone]

## 2021-08-04 NOTE — Discharge Instructions (Addendum)
You were seen in the emergency department for elevated calcium levels.   We gave you IV fluids and multiple medications to help bring down your calcium. Calcitonin, is a faster acting medication, which helps bring down the numbers in the short term while the Zolendronic acid (bisphosphonate) is a longer acting meidcation that should continue to help lower your calcium over the next 24-48 hours.   I'd like you to have repeat labs done at your infectious disease appointment on Monday.

## 2021-08-05 DIAGNOSIS — T8131XA Disruption of external operation (surgical) wound, not elsewhere classified, initial encounter: Secondary | ICD-10-CM | POA: Diagnosis not present

## 2021-08-06 ENCOUNTER — Ambulatory Visit (HOSPITAL_COMMUNITY)
Admission: RE | Admit: 2021-08-06 | Discharge: 2021-08-06 | Disposition: A | Discharge status: Home / Self Care | Payer: BC Managed Care – PPO | Source: Ambulatory Visit | Attending: Interventional Radiology | Admitting: Interventional Radiology

## 2021-08-06 ENCOUNTER — Telehealth: Payer: Self-pay | Admitting: General Practice

## 2021-08-06 ENCOUNTER — Other Ambulatory Visit (HOSPITAL_COMMUNITY): Payer: Self-pay | Admitting: Interventional Radiology

## 2021-08-06 ENCOUNTER — Telehealth: Payer: Self-pay

## 2021-08-06 DIAGNOSIS — N1339 Other hydronephrosis: Secondary | ICD-10-CM | POA: Diagnosis not present

## 2021-08-06 DIAGNOSIS — N135 Crossing vessel and stricture of ureter without hydronephrosis: Secondary | ICD-10-CM | POA: Diagnosis not present

## 2021-08-06 DIAGNOSIS — Z436 Encounter for attention to other artificial openings of urinary tract: Secondary | ICD-10-CM | POA: Diagnosis not present

## 2021-08-06 DIAGNOSIS — Z466 Encounter for fitting and adjustment of urinary device: Secondary | ICD-10-CM | POA: Diagnosis not present

## 2021-08-06 DIAGNOSIS — T8131XA Disruption of external operation (surgical) wound, not elsewhere classified, initial encounter: Secondary | ICD-10-CM | POA: Diagnosis not present

## 2021-08-06 HISTORY — PX: IR NEPHROSTOMY EXCHANGE RIGHT: IMG6070

## 2021-08-06 MED ORDER — LIDOCAINE HCL 1 % IJ SOLN
INTRAMUSCULAR | Status: AC
  Filled 2021-08-06: qty 20

## 2021-08-06 MED ORDER — IOHEXOL 300 MG/ML  SOLN: 50.0000 mL | Freq: Once | INTRAMUSCULAR | Status: DC | PRN

## 2021-08-06 NOTE — Telephone Encounter (Signed)
-----   Message from Mignon Pine, DO sent at 08/06/2021  8:15 AM EST ----- When home health goes to his house today for 1st dose, can they repeat labs on him?  In particular a CMP so we can reassess his calcium?

## 2021-08-06 NOTE — Telephone Encounter (Signed)
error 

## 2021-08-06 NOTE — Telephone Encounter (Addendum)
Patient's wife called back stating that patient has decided not to proceed with having the picc line placed and patient would like to cancel his follow up appointment with Dr. Juleen China. Per Dr. Juleen China patient can continue to take the doxycycline if they would like to. Patient also advise to makes sure he follows up with his pcp or oncology to have repeat CMP to recheck his calcium.   I have reached out to [facility] and IR at [facility] to let them know to cancel patient's appointment for picc placement and first dose.  Anitra Doxtater T Brooks Sailors

## 2021-08-06 NOTE — Telephone Encounter (Signed)
Transition Care Management Unsuccessful Follow-up Telephone Call  Date of discharge and from where:  08/04/21 from [facility]   Attempts:  1st Attempt  Reason for unsuccessful TCM follow-up call:  Left voice message

## 2021-08-06 NOTE — Telephone Encounter (Signed)
I spoke with Abbie with [facility] and asked that she reach out to patient's home health agency to have them draw labs on the patient today when first dose is given. I also let her know we were really needing a repeat CMP to recheck his calcium. I also left a message for the patient's wife to let her know to call our office if labs are not done on the patient today.  Sherald Balbuena T Brooks Sailors

## 2021-08-06 NOTE — Telephone Encounter (Signed)
Patient will be follow up with his pcp or oncologist to get repeat calcium level.

## 2021-08-07 DIAGNOSIS — N183 Chronic kidney disease, stage 3 unspecified: Secondary | ICD-10-CM | POA: Diagnosis not present

## 2021-08-07 DIAGNOSIS — L89216 Pressure-induced deep tissue damage of right hip: Secondary | ICD-10-CM | POA: Diagnosis not present

## 2021-08-07 DIAGNOSIS — C679 Malignant neoplasm of bladder, unspecified: Secondary | ICD-10-CM | POA: Diagnosis not present

## 2021-08-07 DIAGNOSIS — N319 Neuromuscular dysfunction of bladder, unspecified: Secondary | ICD-10-CM | POA: Diagnosis not present

## 2021-08-07 DIAGNOSIS — Z936 Other artificial openings of urinary tract status: Secondary | ICD-10-CM | POA: Diagnosis not present

## 2021-08-07 DIAGNOSIS — L89224 Pressure ulcer of left hip, stage 4: Secondary | ICD-10-CM | POA: Diagnosis not present

## 2021-08-07 DIAGNOSIS — T8131XA Disruption of external operation (surgical) wound, not elsewhere classified, initial encounter: Secondary | ICD-10-CM | POA: Diagnosis not present

## 2021-08-07 DIAGNOSIS — C7919 Secondary malignant neoplasm of other urinary organs: Secondary | ICD-10-CM | POA: Diagnosis not present

## 2021-08-07 DIAGNOSIS — Z466 Encounter for fitting and adjustment of urinary device: Secondary | ICD-10-CM | POA: Diagnosis not present

## 2021-08-07 DIAGNOSIS — S24109D Unspecified injury at unspecified level of thoracic spinal cord, subsequent encounter: Secondary | ICD-10-CM | POA: Diagnosis not present

## 2021-08-07 DIAGNOSIS — C779 Secondary and unspecified malignant neoplasm of lymph node, unspecified: Secondary | ICD-10-CM | POA: Diagnosis not present

## 2021-08-07 DIAGNOSIS — C7989 Secondary malignant neoplasm of other specified sites: Secondary | ICD-10-CM | POA: Diagnosis not present

## 2021-08-07 DIAGNOSIS — Z515 Encounter for palliative care: Secondary | ICD-10-CM | POA: Diagnosis not present

## 2021-08-07 DIAGNOSIS — G8220 Paraplegia, unspecified: Secondary | ICD-10-CM | POA: Diagnosis not present

## 2021-08-08 DIAGNOSIS — C779 Secondary and unspecified malignant neoplasm of lymph node, unspecified: Secondary | ICD-10-CM | POA: Diagnosis not present

## 2021-08-08 DIAGNOSIS — Z936 Other artificial openings of urinary tract status: Secondary | ICD-10-CM | POA: Diagnosis not present

## 2021-08-08 DIAGNOSIS — Z466 Encounter for fitting and adjustment of urinary device: Secondary | ICD-10-CM | POA: Diagnosis not present

## 2021-08-08 DIAGNOSIS — G8220 Paraplegia, unspecified: Secondary | ICD-10-CM | POA: Diagnosis not present

## 2021-08-08 DIAGNOSIS — Z515 Encounter for palliative care: Secondary | ICD-10-CM | POA: Diagnosis not present

## 2021-08-08 DIAGNOSIS — C7919 Secondary malignant neoplasm of other urinary organs: Secondary | ICD-10-CM | POA: Diagnosis not present

## 2021-08-08 DIAGNOSIS — N183 Chronic kidney disease, stage 3 unspecified: Secondary | ICD-10-CM | POA: Diagnosis not present

## 2021-08-08 DIAGNOSIS — L89216 Pressure-induced deep tissue damage of right hip: Secondary | ICD-10-CM | POA: Diagnosis not present

## 2021-08-08 DIAGNOSIS — L89224 Pressure ulcer of left hip, stage 4: Secondary | ICD-10-CM | POA: Diagnosis not present

## 2021-08-08 DIAGNOSIS — T8131XA Disruption of external operation (surgical) wound, not elsewhere classified, initial encounter: Secondary | ICD-10-CM | POA: Diagnosis not present

## 2021-08-08 DIAGNOSIS — C679 Malignant neoplasm of bladder, unspecified: Secondary | ICD-10-CM | POA: Diagnosis not present

## 2021-08-08 DIAGNOSIS — N319 Neuromuscular dysfunction of bladder, unspecified: Secondary | ICD-10-CM | POA: Diagnosis not present

## 2021-08-08 DIAGNOSIS — S24109D Unspecified injury at unspecified level of thoracic spinal cord, subsequent encounter: Secondary | ICD-10-CM | POA: Diagnosis not present

## 2021-08-08 DIAGNOSIS — C7989 Secondary malignant neoplasm of other specified sites: Secondary | ICD-10-CM | POA: Diagnosis not present

## 2021-08-08 NOTE — Telephone Encounter (Signed)
Transition Care Management Unsuccessful Follow-up Telephone Call  Date of discharge and from where:  08/04/21 from [facility]  Attempts:  2nd Attempt  Reason for unsuccessful TCM follow-up call:  Left voice message

## 2021-08-08 NOTE — Telephone Encounter (Signed)
error 

## 2021-08-09 DIAGNOSIS — C7989 Secondary malignant neoplasm of other specified sites: Secondary | ICD-10-CM | POA: Diagnosis not present

## 2021-08-09 DIAGNOSIS — Z936 Other artificial openings of urinary tract status: Secondary | ICD-10-CM | POA: Diagnosis not present

## 2021-08-09 DIAGNOSIS — G8220 Paraplegia, unspecified: Secondary | ICD-10-CM | POA: Diagnosis not present

## 2021-08-09 DIAGNOSIS — N319 Neuromuscular dysfunction of bladder, unspecified: Secondary | ICD-10-CM | POA: Diagnosis not present

## 2021-08-09 DIAGNOSIS — Z515 Encounter for palliative care: Secondary | ICD-10-CM | POA: Diagnosis not present

## 2021-08-09 DIAGNOSIS — S24109D Unspecified injury at unspecified level of thoracic spinal cord, subsequent encounter: Secondary | ICD-10-CM | POA: Diagnosis not present

## 2021-08-09 DIAGNOSIS — C7919 Secondary malignant neoplasm of other urinary organs: Secondary | ICD-10-CM | POA: Diagnosis not present

## 2021-08-09 DIAGNOSIS — L89216 Pressure-induced deep tissue damage of right hip: Secondary | ICD-10-CM | POA: Diagnosis not present

## 2021-08-09 DIAGNOSIS — C679 Malignant neoplasm of bladder, unspecified: Secondary | ICD-10-CM | POA: Diagnosis not present

## 2021-08-09 DIAGNOSIS — T8131XA Disruption of external operation (surgical) wound, not elsewhere classified, initial encounter: Secondary | ICD-10-CM | POA: Diagnosis not present

## 2021-08-09 DIAGNOSIS — C779 Secondary and unspecified malignant neoplasm of lymph node, unspecified: Secondary | ICD-10-CM | POA: Diagnosis not present

## 2021-08-09 DIAGNOSIS — N183 Chronic kidney disease, stage 3 unspecified: Secondary | ICD-10-CM | POA: Diagnosis not present

## 2021-08-09 DIAGNOSIS — L89224 Pressure ulcer of left hip, stage 4: Secondary | ICD-10-CM | POA: Diagnosis not present

## 2021-08-09 DIAGNOSIS — Z466 Encounter for fitting and adjustment of urinary device: Secondary | ICD-10-CM | POA: Diagnosis not present

## 2021-08-10 DIAGNOSIS — Z466 Encounter for fitting and adjustment of urinary device: Secondary | ICD-10-CM | POA: Diagnosis not present

## 2021-08-10 DIAGNOSIS — N183 Chronic kidney disease, stage 3 unspecified: Secondary | ICD-10-CM | POA: Diagnosis not present

## 2021-08-10 DIAGNOSIS — C7989 Secondary malignant neoplasm of other specified sites: Secondary | ICD-10-CM | POA: Diagnosis not present

## 2021-08-10 DIAGNOSIS — L89224 Pressure ulcer of left hip, stage 4: Secondary | ICD-10-CM | POA: Diagnosis not present

## 2021-08-10 DIAGNOSIS — Z936 Other artificial openings of urinary tract status: Secondary | ICD-10-CM | POA: Diagnosis not present

## 2021-08-10 DIAGNOSIS — G8220 Paraplegia, unspecified: Secondary | ICD-10-CM | POA: Diagnosis not present

## 2021-08-10 DIAGNOSIS — C779 Secondary and unspecified malignant neoplasm of lymph node, unspecified: Secondary | ICD-10-CM | POA: Diagnosis not present

## 2021-08-10 DIAGNOSIS — L89216 Pressure-induced deep tissue damage of right hip: Secondary | ICD-10-CM | POA: Diagnosis not present

## 2021-08-10 DIAGNOSIS — Z515 Encounter for palliative care: Secondary | ICD-10-CM | POA: Diagnosis not present

## 2021-08-10 DIAGNOSIS — C679 Malignant neoplasm of bladder, unspecified: Secondary | ICD-10-CM | POA: Diagnosis not present

## 2021-08-10 DIAGNOSIS — C7919 Secondary malignant neoplasm of other urinary organs: Secondary | ICD-10-CM | POA: Diagnosis not present

## 2021-08-10 DIAGNOSIS — T8131XA Disruption of external operation (surgical) wound, not elsewhere classified, initial encounter: Secondary | ICD-10-CM | POA: Diagnosis not present

## 2021-08-10 DIAGNOSIS — S24109D Unspecified injury at unspecified level of thoracic spinal cord, subsequent encounter: Secondary | ICD-10-CM | POA: Diagnosis not present

## 2021-08-10 DIAGNOSIS — N319 Neuromuscular dysfunction of bladder, unspecified: Secondary | ICD-10-CM | POA: Diagnosis not present

## 2021-08-11 DIAGNOSIS — Z515 Encounter for palliative care: Secondary | ICD-10-CM | POA: Diagnosis not present

## 2021-08-11 DIAGNOSIS — G8220 Paraplegia, unspecified: Secondary | ICD-10-CM | POA: Diagnosis not present

## 2021-08-11 DIAGNOSIS — Z936 Other artificial openings of urinary tract status: Secondary | ICD-10-CM | POA: Diagnosis not present

## 2021-08-11 DIAGNOSIS — N183 Chronic kidney disease, stage 3 unspecified: Secondary | ICD-10-CM | POA: Diagnosis not present

## 2021-08-11 DIAGNOSIS — Z466 Encounter for fitting and adjustment of urinary device: Secondary | ICD-10-CM | POA: Diagnosis not present

## 2021-08-11 DIAGNOSIS — L89216 Pressure-induced deep tissue damage of right hip: Secondary | ICD-10-CM | POA: Diagnosis not present

## 2021-08-11 DIAGNOSIS — C779 Secondary and unspecified malignant neoplasm of lymph node, unspecified: Secondary | ICD-10-CM | POA: Diagnosis not present

## 2021-08-11 DIAGNOSIS — T8131XA Disruption of external operation (surgical) wound, not elsewhere classified, initial encounter: Secondary | ICD-10-CM | POA: Diagnosis not present

## 2021-08-11 DIAGNOSIS — C7919 Secondary malignant neoplasm of other urinary organs: Secondary | ICD-10-CM | POA: Diagnosis not present

## 2021-08-11 DIAGNOSIS — C7989 Secondary malignant neoplasm of other specified sites: Secondary | ICD-10-CM | POA: Diagnosis not present

## 2021-08-11 DIAGNOSIS — L89224 Pressure ulcer of left hip, stage 4: Secondary | ICD-10-CM | POA: Diagnosis not present

## 2021-08-11 DIAGNOSIS — S24109D Unspecified injury at unspecified level of thoracic spinal cord, subsequent encounter: Secondary | ICD-10-CM | POA: Diagnosis not present

## 2021-08-11 DIAGNOSIS — C679 Malignant neoplasm of bladder, unspecified: Secondary | ICD-10-CM | POA: Diagnosis not present

## 2021-08-11 DIAGNOSIS — N319 Neuromuscular dysfunction of bladder, unspecified: Secondary | ICD-10-CM | POA: Diagnosis not present

## 2021-08-12 DIAGNOSIS — N183 Chronic kidney disease, stage 3 unspecified: Secondary | ICD-10-CM | POA: Diagnosis not present

## 2021-08-12 DIAGNOSIS — Z466 Encounter for fitting and adjustment of urinary device: Secondary | ICD-10-CM | POA: Diagnosis not present

## 2021-08-12 DIAGNOSIS — L89216 Pressure-induced deep tissue damage of right hip: Secondary | ICD-10-CM | POA: Diagnosis not present

## 2021-08-12 DIAGNOSIS — Z936 Other artificial openings of urinary tract status: Secondary | ICD-10-CM | POA: Diagnosis not present

## 2021-08-12 DIAGNOSIS — S24109D Unspecified injury at unspecified level of thoracic spinal cord, subsequent encounter: Secondary | ICD-10-CM | POA: Diagnosis not present

## 2021-08-12 DIAGNOSIS — C779 Secondary and unspecified malignant neoplasm of lymph node, unspecified: Secondary | ICD-10-CM | POA: Diagnosis not present

## 2021-08-12 DIAGNOSIS — Z515 Encounter for palliative care: Secondary | ICD-10-CM | POA: Diagnosis not present

## 2021-08-12 DIAGNOSIS — G8220 Paraplegia, unspecified: Secondary | ICD-10-CM | POA: Diagnosis not present

## 2021-08-12 DIAGNOSIS — T8131XA Disruption of external operation (surgical) wound, not elsewhere classified, initial encounter: Secondary | ICD-10-CM | POA: Diagnosis not present

## 2021-08-12 DIAGNOSIS — C7919 Secondary malignant neoplasm of other urinary organs: Secondary | ICD-10-CM | POA: Diagnosis not present

## 2021-08-12 DIAGNOSIS — L89224 Pressure ulcer of left hip, stage 4: Secondary | ICD-10-CM | POA: Diagnosis not present

## 2021-08-12 DIAGNOSIS — C679 Malignant neoplasm of bladder, unspecified: Secondary | ICD-10-CM | POA: Diagnosis not present

## 2021-08-12 DIAGNOSIS — N319 Neuromuscular dysfunction of bladder, unspecified: Secondary | ICD-10-CM | POA: Diagnosis not present

## 2021-08-12 DIAGNOSIS — C7989 Secondary malignant neoplasm of other specified sites: Secondary | ICD-10-CM | POA: Diagnosis not present

## 2021-08-13 DIAGNOSIS — Z466 Encounter for fitting and adjustment of urinary device: Secondary | ICD-10-CM | POA: Diagnosis not present

## 2021-08-13 DIAGNOSIS — Z515 Encounter for palliative care: Secondary | ICD-10-CM | POA: Diagnosis not present

## 2021-08-13 DIAGNOSIS — C679 Malignant neoplasm of bladder, unspecified: Secondary | ICD-10-CM | POA: Diagnosis not present

## 2021-08-13 DIAGNOSIS — Z936 Other artificial openings of urinary tract status: Secondary | ICD-10-CM | POA: Diagnosis not present

## 2021-08-13 DIAGNOSIS — G8220 Paraplegia, unspecified: Secondary | ICD-10-CM | POA: Diagnosis not present

## 2021-08-13 DIAGNOSIS — C7919 Secondary malignant neoplasm of other urinary organs: Secondary | ICD-10-CM | POA: Diagnosis not present

## 2021-08-13 DIAGNOSIS — C779 Secondary and unspecified malignant neoplasm of lymph node, unspecified: Secondary | ICD-10-CM | POA: Diagnosis not present

## 2021-08-13 DIAGNOSIS — N183 Chronic kidney disease, stage 3 unspecified: Secondary | ICD-10-CM | POA: Diagnosis not present

## 2021-08-13 DIAGNOSIS — C7989 Secondary malignant neoplasm of other specified sites: Secondary | ICD-10-CM | POA: Diagnosis not present

## 2021-08-13 DIAGNOSIS — L89216 Pressure-induced deep tissue damage of right hip: Secondary | ICD-10-CM | POA: Diagnosis not present

## 2021-08-13 DIAGNOSIS — L89224 Pressure ulcer of left hip, stage 4: Secondary | ICD-10-CM | POA: Diagnosis not present

## 2021-08-13 DIAGNOSIS — T8131XA Disruption of external operation (surgical) wound, not elsewhere classified, initial encounter: Secondary | ICD-10-CM | POA: Diagnosis not present

## 2021-08-13 DIAGNOSIS — N319 Neuromuscular dysfunction of bladder, unspecified: Secondary | ICD-10-CM | POA: Diagnosis not present

## 2021-08-13 DIAGNOSIS — S24109D Unspecified injury at unspecified level of thoracic spinal cord, subsequent encounter: Secondary | ICD-10-CM | POA: Diagnosis not present

## 2021-08-13 NOTE — Telephone Encounter (Signed)
Transition Care Management Unsuccessful Follow-up Telephone Call  Date of discharge and from where:  08/05/19 from [facility]  Attempts:  3rd Attempt  Reason for unsuccessful TCM follow-up call:  Left voice message

## 2021-08-14 DIAGNOSIS — G8220 Paraplegia, unspecified: Secondary | ICD-10-CM | POA: Diagnosis not present

## 2021-08-14 DIAGNOSIS — C679 Malignant neoplasm of bladder, unspecified: Secondary | ICD-10-CM | POA: Diagnosis not present

## 2021-08-14 DIAGNOSIS — C779 Secondary and unspecified malignant neoplasm of lymph node, unspecified: Secondary | ICD-10-CM | POA: Diagnosis not present

## 2021-08-14 DIAGNOSIS — L89224 Pressure ulcer of left hip, stage 4: Secondary | ICD-10-CM | POA: Diagnosis not present

## 2021-08-14 DIAGNOSIS — N183 Chronic kidney disease, stage 3 unspecified: Secondary | ICD-10-CM | POA: Diagnosis not present

## 2021-08-14 DIAGNOSIS — Z515 Encounter for palliative care: Secondary | ICD-10-CM | POA: Diagnosis not present

## 2021-08-14 DIAGNOSIS — S24109D Unspecified injury at unspecified level of thoracic spinal cord, subsequent encounter: Secondary | ICD-10-CM | POA: Diagnosis not present

## 2021-08-14 DIAGNOSIS — T8131XA Disruption of external operation (surgical) wound, not elsewhere classified, initial encounter: Secondary | ICD-10-CM | POA: Diagnosis not present

## 2021-08-14 DIAGNOSIS — Z466 Encounter for fitting and adjustment of urinary device: Secondary | ICD-10-CM | POA: Diagnosis not present

## 2021-08-14 DIAGNOSIS — C7919 Secondary malignant neoplasm of other urinary organs: Secondary | ICD-10-CM | POA: Diagnosis not present

## 2021-08-14 DIAGNOSIS — L89216 Pressure-induced deep tissue damage of right hip: Secondary | ICD-10-CM | POA: Diagnosis not present

## 2021-08-14 DIAGNOSIS — C7989 Secondary malignant neoplasm of other specified sites: Secondary | ICD-10-CM | POA: Diagnosis not present

## 2021-08-14 DIAGNOSIS — N319 Neuromuscular dysfunction of bladder, unspecified: Secondary | ICD-10-CM | POA: Diagnosis not present

## 2021-08-14 DIAGNOSIS — Z936 Other artificial openings of urinary tract status: Secondary | ICD-10-CM | POA: Diagnosis not present

## 2021-08-15 DIAGNOSIS — C7919 Secondary malignant neoplasm of other urinary organs: Secondary | ICD-10-CM | POA: Diagnosis not present

## 2021-08-15 DIAGNOSIS — T8131XA Disruption of external operation (surgical) wound, not elsewhere classified, initial encounter: Secondary | ICD-10-CM | POA: Diagnosis not present

## 2021-08-15 DIAGNOSIS — Z936 Other artificial openings of urinary tract status: Secondary | ICD-10-CM | POA: Diagnosis not present

## 2021-08-15 DIAGNOSIS — Z515 Encounter for palliative care: Secondary | ICD-10-CM | POA: Diagnosis not present

## 2021-08-15 DIAGNOSIS — L89216 Pressure-induced deep tissue damage of right hip: Secondary | ICD-10-CM | POA: Diagnosis not present

## 2021-08-15 DIAGNOSIS — N183 Chronic kidney disease, stage 3 unspecified: Secondary | ICD-10-CM | POA: Diagnosis not present

## 2021-08-15 DIAGNOSIS — G8220 Paraplegia, unspecified: Secondary | ICD-10-CM | POA: Diagnosis not present

## 2021-08-15 DIAGNOSIS — N319 Neuromuscular dysfunction of bladder, unspecified: Secondary | ICD-10-CM | POA: Diagnosis not present

## 2021-08-15 DIAGNOSIS — C779 Secondary and unspecified malignant neoplasm of lymph node, unspecified: Secondary | ICD-10-CM | POA: Diagnosis not present

## 2021-08-15 DIAGNOSIS — C679 Malignant neoplasm of bladder, unspecified: Secondary | ICD-10-CM | POA: Diagnosis not present

## 2021-08-15 DIAGNOSIS — L89224 Pressure ulcer of left hip, stage 4: Secondary | ICD-10-CM | POA: Diagnosis not present

## 2021-08-15 DIAGNOSIS — Z466 Encounter for fitting and adjustment of urinary device: Secondary | ICD-10-CM | POA: Diagnosis not present

## 2021-08-15 DIAGNOSIS — S24109D Unspecified injury at unspecified level of thoracic spinal cord, subsequent encounter: Secondary | ICD-10-CM | POA: Diagnosis not present

## 2021-08-15 DIAGNOSIS — C7989 Secondary malignant neoplasm of other specified sites: Secondary | ICD-10-CM | POA: Diagnosis not present

## 2021-08-16 DIAGNOSIS — C7919 Secondary malignant neoplasm of other urinary organs: Secondary | ICD-10-CM | POA: Diagnosis not present

## 2021-08-16 DIAGNOSIS — C7989 Secondary malignant neoplasm of other specified sites: Secondary | ICD-10-CM | POA: Diagnosis not present

## 2021-08-16 DIAGNOSIS — N183 Chronic kidney disease, stage 3 unspecified: Secondary | ICD-10-CM | POA: Diagnosis not present

## 2021-08-16 DIAGNOSIS — Z515 Encounter for palliative care: Secondary | ICD-10-CM | POA: Diagnosis not present

## 2021-08-16 DIAGNOSIS — L89216 Pressure-induced deep tissue damage of right hip: Secondary | ICD-10-CM | POA: Diagnosis not present

## 2021-08-16 DIAGNOSIS — C679 Malignant neoplasm of bladder, unspecified: Secondary | ICD-10-CM | POA: Diagnosis not present

## 2021-08-16 DIAGNOSIS — Z936 Other artificial openings of urinary tract status: Secondary | ICD-10-CM | POA: Diagnosis not present

## 2021-08-16 DIAGNOSIS — N319 Neuromuscular dysfunction of bladder, unspecified: Secondary | ICD-10-CM | POA: Diagnosis not present

## 2021-08-16 DIAGNOSIS — C779 Secondary and unspecified malignant neoplasm of lymph node, unspecified: Secondary | ICD-10-CM | POA: Diagnosis not present

## 2021-08-16 DIAGNOSIS — S24109D Unspecified injury at unspecified level of thoracic spinal cord, subsequent encounter: Secondary | ICD-10-CM | POA: Diagnosis not present

## 2021-08-16 DIAGNOSIS — Z466 Encounter for fitting and adjustment of urinary device: Secondary | ICD-10-CM | POA: Diagnosis not present

## 2021-08-16 DIAGNOSIS — G8220 Paraplegia, unspecified: Secondary | ICD-10-CM | POA: Diagnosis not present

## 2021-08-16 DIAGNOSIS — L89224 Pressure ulcer of left hip, stage 4: Secondary | ICD-10-CM | POA: Diagnosis not present

## 2021-08-17 DIAGNOSIS — G8220 Paraplegia, unspecified: Secondary | ICD-10-CM | POA: Diagnosis not present

## 2021-08-17 DIAGNOSIS — N183 Chronic kidney disease, stage 3 unspecified: Secondary | ICD-10-CM | POA: Diagnosis not present

## 2021-08-17 DIAGNOSIS — C7989 Secondary malignant neoplasm of other specified sites: Secondary | ICD-10-CM | POA: Diagnosis not present

## 2021-08-17 DIAGNOSIS — C779 Secondary and unspecified malignant neoplasm of lymph node, unspecified: Secondary | ICD-10-CM | POA: Diagnosis not present

## 2021-08-17 DIAGNOSIS — C679 Malignant neoplasm of bladder, unspecified: Secondary | ICD-10-CM | POA: Diagnosis not present

## 2021-08-17 DIAGNOSIS — Z936 Other artificial openings of urinary tract status: Secondary | ICD-10-CM | POA: Diagnosis not present

## 2021-08-17 DIAGNOSIS — S24109D Unspecified injury at unspecified level of thoracic spinal cord, subsequent encounter: Secondary | ICD-10-CM | POA: Diagnosis not present

## 2021-08-17 DIAGNOSIS — Z515 Encounter for palliative care: Secondary | ICD-10-CM | POA: Diagnosis not present

## 2021-08-17 DIAGNOSIS — L89224 Pressure ulcer of left hip, stage 4: Secondary | ICD-10-CM | POA: Diagnosis not present

## 2021-08-17 DIAGNOSIS — C7919 Secondary malignant neoplasm of other urinary organs: Secondary | ICD-10-CM | POA: Diagnosis not present

## 2021-08-17 DIAGNOSIS — N319 Neuromuscular dysfunction of bladder, unspecified: Secondary | ICD-10-CM | POA: Diagnosis not present

## 2021-08-17 DIAGNOSIS — L89216 Pressure-induced deep tissue damage of right hip: Secondary | ICD-10-CM | POA: Diagnosis not present

## 2021-08-17 DIAGNOSIS — Z466 Encounter for fitting and adjustment of urinary device: Secondary | ICD-10-CM | POA: Diagnosis not present

## 2021-08-18 DIAGNOSIS — Z515 Encounter for palliative care: Secondary | ICD-10-CM | POA: Diagnosis not present

## 2021-08-18 DIAGNOSIS — G8220 Paraplegia, unspecified: Secondary | ICD-10-CM | POA: Diagnosis not present

## 2021-08-18 DIAGNOSIS — C7919 Secondary malignant neoplasm of other urinary organs: Secondary | ICD-10-CM | POA: Diagnosis not present

## 2021-08-18 DIAGNOSIS — Z936 Other artificial openings of urinary tract status: Secondary | ICD-10-CM | POA: Diagnosis not present

## 2021-08-18 DIAGNOSIS — S24109D Unspecified injury at unspecified level of thoracic spinal cord, subsequent encounter: Secondary | ICD-10-CM | POA: Diagnosis not present

## 2021-08-18 DIAGNOSIS — L89224 Pressure ulcer of left hip, stage 4: Secondary | ICD-10-CM | POA: Diagnosis not present

## 2021-08-18 DIAGNOSIS — C7989 Secondary malignant neoplasm of other specified sites: Secondary | ICD-10-CM | POA: Diagnosis not present

## 2021-08-18 DIAGNOSIS — N319 Neuromuscular dysfunction of bladder, unspecified: Secondary | ICD-10-CM | POA: Diagnosis not present

## 2021-08-18 DIAGNOSIS — C779 Secondary and unspecified malignant neoplasm of lymph node, unspecified: Secondary | ICD-10-CM | POA: Diagnosis not present

## 2021-08-18 DIAGNOSIS — N183 Chronic kidney disease, stage 3 unspecified: Secondary | ICD-10-CM | POA: Diagnosis not present

## 2021-08-18 DIAGNOSIS — Z466 Encounter for fitting and adjustment of urinary device: Secondary | ICD-10-CM | POA: Diagnosis not present

## 2021-08-18 DIAGNOSIS — L89216 Pressure-induced deep tissue damage of right hip: Secondary | ICD-10-CM | POA: Diagnosis not present

## 2021-08-18 DIAGNOSIS — C679 Malignant neoplasm of bladder, unspecified: Secondary | ICD-10-CM | POA: Diagnosis not present

## 2021-08-19 DIAGNOSIS — C679 Malignant neoplasm of bladder, unspecified: Secondary | ICD-10-CM | POA: Diagnosis not present

## 2021-08-19 DIAGNOSIS — C779 Secondary and unspecified malignant neoplasm of lymph node, unspecified: Secondary | ICD-10-CM | POA: Diagnosis not present

## 2021-08-19 DIAGNOSIS — N319 Neuromuscular dysfunction of bladder, unspecified: Secondary | ICD-10-CM | POA: Diagnosis not present

## 2021-08-19 DIAGNOSIS — S24109D Unspecified injury at unspecified level of thoracic spinal cord, subsequent encounter: Secondary | ICD-10-CM | POA: Diagnosis not present

## 2021-08-19 DIAGNOSIS — L89216 Pressure-induced deep tissue damage of right hip: Secondary | ICD-10-CM | POA: Diagnosis not present

## 2021-08-19 DIAGNOSIS — G8220 Paraplegia, unspecified: Secondary | ICD-10-CM | POA: Diagnosis not present

## 2021-08-19 DIAGNOSIS — N183 Chronic kidney disease, stage 3 unspecified: Secondary | ICD-10-CM | POA: Diagnosis not present

## 2021-08-19 DIAGNOSIS — Z466 Encounter for fitting and adjustment of urinary device: Secondary | ICD-10-CM | POA: Diagnosis not present

## 2021-08-19 DIAGNOSIS — C7989 Secondary malignant neoplasm of other specified sites: Secondary | ICD-10-CM | POA: Diagnosis not present

## 2021-08-19 DIAGNOSIS — Z515 Encounter for palliative care: Secondary | ICD-10-CM | POA: Diagnosis not present

## 2021-08-19 DIAGNOSIS — L89224 Pressure ulcer of left hip, stage 4: Secondary | ICD-10-CM | POA: Diagnosis not present

## 2021-08-19 DIAGNOSIS — Z936 Other artificial openings of urinary tract status: Secondary | ICD-10-CM | POA: Diagnosis not present

## 2021-08-19 DIAGNOSIS — C7919 Secondary malignant neoplasm of other urinary organs: Secondary | ICD-10-CM | POA: Diagnosis not present

## 2021-08-20 DIAGNOSIS — C679 Malignant neoplasm of bladder, unspecified: Secondary | ICD-10-CM | POA: Diagnosis not present

## 2021-08-20 DIAGNOSIS — C7919 Secondary malignant neoplasm of other urinary organs: Secondary | ICD-10-CM | POA: Diagnosis not present

## 2021-08-20 DIAGNOSIS — G8220 Paraplegia, unspecified: Secondary | ICD-10-CM | POA: Diagnosis not present

## 2021-08-20 DIAGNOSIS — Z466 Encounter for fitting and adjustment of urinary device: Secondary | ICD-10-CM | POA: Diagnosis not present

## 2021-08-20 DIAGNOSIS — N319 Neuromuscular dysfunction of bladder, unspecified: Secondary | ICD-10-CM | POA: Diagnosis not present

## 2021-08-20 DIAGNOSIS — L89224 Pressure ulcer of left hip, stage 4: Secondary | ICD-10-CM | POA: Diagnosis not present

## 2021-08-20 DIAGNOSIS — L89216 Pressure-induced deep tissue damage of right hip: Secondary | ICD-10-CM | POA: Diagnosis not present

## 2021-08-20 DIAGNOSIS — S24109D Unspecified injury at unspecified level of thoracic spinal cord, subsequent encounter: Secondary | ICD-10-CM | POA: Diagnosis not present

## 2021-08-20 DIAGNOSIS — C7989 Secondary malignant neoplasm of other specified sites: Secondary | ICD-10-CM | POA: Diagnosis not present

## 2021-08-20 DIAGNOSIS — N183 Chronic kidney disease, stage 3 unspecified: Secondary | ICD-10-CM | POA: Diagnosis not present

## 2021-08-20 DIAGNOSIS — Z936 Other artificial openings of urinary tract status: Secondary | ICD-10-CM | POA: Diagnosis not present

## 2021-08-20 DIAGNOSIS — Z515 Encounter for palliative care: Secondary | ICD-10-CM | POA: Diagnosis not present

## 2021-08-20 DIAGNOSIS — C779 Secondary and unspecified malignant neoplasm of lymph node, unspecified: Secondary | ICD-10-CM | POA: Diagnosis not present

## 2021-08-21 DIAGNOSIS — L89216 Pressure-induced deep tissue damage of right hip: Secondary | ICD-10-CM | POA: Diagnosis not present

## 2021-08-21 DIAGNOSIS — Z936 Other artificial openings of urinary tract status: Secondary | ICD-10-CM | POA: Diagnosis not present

## 2021-08-21 DIAGNOSIS — N183 Chronic kidney disease, stage 3 unspecified: Secondary | ICD-10-CM | POA: Diagnosis not present

## 2021-08-21 DIAGNOSIS — G8220 Paraplegia, unspecified: Secondary | ICD-10-CM | POA: Diagnosis not present

## 2021-08-21 DIAGNOSIS — S24109D Unspecified injury at unspecified level of thoracic spinal cord, subsequent encounter: Secondary | ICD-10-CM | POA: Diagnosis not present

## 2021-08-21 DIAGNOSIS — C779 Secondary and unspecified malignant neoplasm of lymph node, unspecified: Secondary | ICD-10-CM | POA: Diagnosis not present

## 2021-08-21 DIAGNOSIS — Z515 Encounter for palliative care: Secondary | ICD-10-CM | POA: Diagnosis not present

## 2021-08-21 DIAGNOSIS — C7989 Secondary malignant neoplasm of other specified sites: Secondary | ICD-10-CM | POA: Diagnosis not present

## 2021-08-21 DIAGNOSIS — N319 Neuromuscular dysfunction of bladder, unspecified: Secondary | ICD-10-CM | POA: Diagnosis not present

## 2021-08-21 DIAGNOSIS — L89224 Pressure ulcer of left hip, stage 4: Secondary | ICD-10-CM | POA: Diagnosis not present

## 2021-08-21 DIAGNOSIS — Z466 Encounter for fitting and adjustment of urinary device: Secondary | ICD-10-CM | POA: Diagnosis not present

## 2021-08-21 DIAGNOSIS — C679 Malignant neoplasm of bladder, unspecified: Secondary | ICD-10-CM | POA: Diagnosis not present

## 2021-08-21 DIAGNOSIS — C7919 Secondary malignant neoplasm of other urinary organs: Secondary | ICD-10-CM | POA: Diagnosis not present

## 2021-08-22 DIAGNOSIS — L89224 Pressure ulcer of left hip, stage 4: Secondary | ICD-10-CM | POA: Diagnosis not present

## 2021-08-22 DIAGNOSIS — L89216 Pressure-induced deep tissue damage of right hip: Secondary | ICD-10-CM | POA: Diagnosis not present

## 2021-08-22 DIAGNOSIS — C7989 Secondary malignant neoplasm of other specified sites: Secondary | ICD-10-CM | POA: Diagnosis not present

## 2021-08-22 DIAGNOSIS — S24109D Unspecified injury at unspecified level of thoracic spinal cord, subsequent encounter: Secondary | ICD-10-CM | POA: Diagnosis not present

## 2021-08-22 DIAGNOSIS — Z515 Encounter for palliative care: Secondary | ICD-10-CM | POA: Diagnosis not present

## 2021-08-22 DIAGNOSIS — Z936 Other artificial openings of urinary tract status: Secondary | ICD-10-CM | POA: Diagnosis not present

## 2021-08-22 DIAGNOSIS — C7919 Secondary malignant neoplasm of other urinary organs: Secondary | ICD-10-CM | POA: Diagnosis not present

## 2021-08-22 DIAGNOSIS — G8220 Paraplegia, unspecified: Secondary | ICD-10-CM | POA: Diagnosis not present

## 2021-08-22 DIAGNOSIS — Z466 Encounter for fitting and adjustment of urinary device: Secondary | ICD-10-CM | POA: Diagnosis not present

## 2021-08-22 DIAGNOSIS — C679 Malignant neoplasm of bladder, unspecified: Secondary | ICD-10-CM | POA: Diagnosis not present

## 2021-08-22 DIAGNOSIS — N183 Chronic kidney disease, stage 3 unspecified: Secondary | ICD-10-CM | POA: Diagnosis not present

## 2021-08-22 DIAGNOSIS — C779 Secondary and unspecified malignant neoplasm of lymph node, unspecified: Secondary | ICD-10-CM | POA: Diagnosis not present

## 2021-08-22 DIAGNOSIS — N319 Neuromuscular dysfunction of bladder, unspecified: Secondary | ICD-10-CM | POA: Diagnosis not present

## 2021-08-23 DIAGNOSIS — Z936 Other artificial openings of urinary tract status: Secondary | ICD-10-CM | POA: Diagnosis not present

## 2021-08-23 DIAGNOSIS — G8220 Paraplegia, unspecified: Secondary | ICD-10-CM | POA: Diagnosis not present

## 2021-08-23 DIAGNOSIS — L89224 Pressure ulcer of left hip, stage 4: Secondary | ICD-10-CM | POA: Diagnosis not present

## 2021-08-23 DIAGNOSIS — Z515 Encounter for palliative care: Secondary | ICD-10-CM | POA: Diagnosis not present

## 2021-08-23 DIAGNOSIS — N319 Neuromuscular dysfunction of bladder, unspecified: Secondary | ICD-10-CM | POA: Diagnosis not present

## 2021-08-23 DIAGNOSIS — C679 Malignant neoplasm of bladder, unspecified: Secondary | ICD-10-CM | POA: Diagnosis not present

## 2021-08-23 DIAGNOSIS — Z466 Encounter for fitting and adjustment of urinary device: Secondary | ICD-10-CM | POA: Diagnosis not present

## 2021-08-23 DIAGNOSIS — N183 Chronic kidney disease, stage 3 unspecified: Secondary | ICD-10-CM | POA: Diagnosis not present

## 2021-08-23 DIAGNOSIS — L89216 Pressure-induced deep tissue damage of right hip: Secondary | ICD-10-CM | POA: Diagnosis not present

## 2021-08-23 DIAGNOSIS — S24109D Unspecified injury at unspecified level of thoracic spinal cord, subsequent encounter: Secondary | ICD-10-CM | POA: Diagnosis not present

## 2021-08-23 DIAGNOSIS — C7919 Secondary malignant neoplasm of other urinary organs: Secondary | ICD-10-CM | POA: Diagnosis not present

## 2021-08-23 DIAGNOSIS — C7989 Secondary malignant neoplasm of other specified sites: Secondary | ICD-10-CM | POA: Diagnosis not present

## 2021-08-23 DIAGNOSIS — C779 Secondary and unspecified malignant neoplasm of lymph node, unspecified: Secondary | ICD-10-CM | POA: Diagnosis not present

## 2021-08-24 DIAGNOSIS — L89216 Pressure-induced deep tissue damage of right hip: Secondary | ICD-10-CM | POA: Diagnosis not present

## 2021-08-24 DIAGNOSIS — C7989 Secondary malignant neoplasm of other specified sites: Secondary | ICD-10-CM | POA: Diagnosis not present

## 2021-08-24 DIAGNOSIS — N183 Chronic kidney disease, stage 3 unspecified: Secondary | ICD-10-CM | POA: Diagnosis not present

## 2021-08-24 DIAGNOSIS — C679 Malignant neoplasm of bladder, unspecified: Secondary | ICD-10-CM | POA: Diagnosis not present

## 2021-08-24 DIAGNOSIS — G8220 Paraplegia, unspecified: Secondary | ICD-10-CM | POA: Diagnosis not present

## 2021-08-24 DIAGNOSIS — C7919 Secondary malignant neoplasm of other urinary organs: Secondary | ICD-10-CM | POA: Diagnosis not present

## 2021-08-24 DIAGNOSIS — Z515 Encounter for palliative care: Secondary | ICD-10-CM | POA: Diagnosis not present

## 2021-08-24 DIAGNOSIS — S24109D Unspecified injury at unspecified level of thoracic spinal cord, subsequent encounter: Secondary | ICD-10-CM | POA: Diagnosis not present

## 2021-08-24 DIAGNOSIS — Z466 Encounter for fitting and adjustment of urinary device: Secondary | ICD-10-CM | POA: Diagnosis not present

## 2021-08-24 DIAGNOSIS — L89224 Pressure ulcer of left hip, stage 4: Secondary | ICD-10-CM | POA: Diagnosis not present

## 2021-08-24 DIAGNOSIS — N319 Neuromuscular dysfunction of bladder, unspecified: Secondary | ICD-10-CM | POA: Diagnosis not present

## 2021-08-24 DIAGNOSIS — Z936 Other artificial openings of urinary tract status: Secondary | ICD-10-CM | POA: Diagnosis not present

## 2021-08-24 DIAGNOSIS — C779 Secondary and unspecified malignant neoplasm of lymph node, unspecified: Secondary | ICD-10-CM | POA: Diagnosis not present

## 2021-08-25 DIAGNOSIS — Z936 Other artificial openings of urinary tract status: Secondary | ICD-10-CM | POA: Diagnosis not present

## 2021-08-25 DIAGNOSIS — C679 Malignant neoplasm of bladder, unspecified: Secondary | ICD-10-CM | POA: Diagnosis not present

## 2021-08-25 DIAGNOSIS — C7989 Secondary malignant neoplasm of other specified sites: Secondary | ICD-10-CM | POA: Diagnosis not present

## 2021-08-25 DIAGNOSIS — Z466 Encounter for fitting and adjustment of urinary device: Secondary | ICD-10-CM | POA: Diagnosis not present

## 2021-08-25 DIAGNOSIS — Z515 Encounter for palliative care: Secondary | ICD-10-CM | POA: Diagnosis not present

## 2021-08-25 DIAGNOSIS — S24109D Unspecified injury at unspecified level of thoracic spinal cord, subsequent encounter: Secondary | ICD-10-CM | POA: Diagnosis not present

## 2021-08-25 DIAGNOSIS — G8220 Paraplegia, unspecified: Secondary | ICD-10-CM | POA: Diagnosis not present

## 2021-08-25 DIAGNOSIS — C779 Secondary and unspecified malignant neoplasm of lymph node, unspecified: Secondary | ICD-10-CM | POA: Diagnosis not present

## 2021-08-25 DIAGNOSIS — C7919 Secondary malignant neoplasm of other urinary organs: Secondary | ICD-10-CM | POA: Diagnosis not present

## 2021-08-25 DIAGNOSIS — L89224 Pressure ulcer of left hip, stage 4: Secondary | ICD-10-CM | POA: Diagnosis not present

## 2021-08-25 DIAGNOSIS — N183 Chronic kidney disease, stage 3 unspecified: Secondary | ICD-10-CM | POA: Diagnosis not present

## 2021-08-25 DIAGNOSIS — L89216 Pressure-induced deep tissue damage of right hip: Secondary | ICD-10-CM | POA: Diagnosis not present

## 2021-08-25 DIAGNOSIS — N319 Neuromuscular dysfunction of bladder, unspecified: Secondary | ICD-10-CM | POA: Diagnosis not present

## 2021-08-26 DIAGNOSIS — Z936 Other artificial openings of urinary tract status: Secondary | ICD-10-CM | POA: Diagnosis not present

## 2021-08-26 DIAGNOSIS — Z466 Encounter for fitting and adjustment of urinary device: Secondary | ICD-10-CM | POA: Diagnosis not present

## 2021-08-26 DIAGNOSIS — C7989 Secondary malignant neoplasm of other specified sites: Secondary | ICD-10-CM | POA: Diagnosis not present

## 2021-08-26 DIAGNOSIS — N319 Neuromuscular dysfunction of bladder, unspecified: Secondary | ICD-10-CM | POA: Diagnosis not present

## 2021-08-26 DIAGNOSIS — L89224 Pressure ulcer of left hip, stage 4: Secondary | ICD-10-CM | POA: Diagnosis not present

## 2021-08-26 DIAGNOSIS — C679 Malignant neoplasm of bladder, unspecified: Secondary | ICD-10-CM | POA: Diagnosis not present

## 2021-08-26 DIAGNOSIS — N183 Chronic kidney disease, stage 3 unspecified: Secondary | ICD-10-CM | POA: Diagnosis not present

## 2021-08-26 DIAGNOSIS — C7919 Secondary malignant neoplasm of other urinary organs: Secondary | ICD-10-CM | POA: Diagnosis not present

## 2021-08-26 DIAGNOSIS — C779 Secondary and unspecified malignant neoplasm of lymph node, unspecified: Secondary | ICD-10-CM | POA: Diagnosis not present

## 2021-08-26 DIAGNOSIS — Z515 Encounter for palliative care: Secondary | ICD-10-CM | POA: Diagnosis not present

## 2021-08-26 DIAGNOSIS — S24109D Unspecified injury at unspecified level of thoracic spinal cord, subsequent encounter: Secondary | ICD-10-CM | POA: Diagnosis not present

## 2021-08-26 DIAGNOSIS — G8220 Paraplegia, unspecified: Secondary | ICD-10-CM | POA: Diagnosis not present

## 2021-08-26 DIAGNOSIS — L89216 Pressure-induced deep tissue damage of right hip: Secondary | ICD-10-CM | POA: Diagnosis not present

## 2021-08-27 DIAGNOSIS — C7919 Secondary malignant neoplasm of other urinary organs: Secondary | ICD-10-CM | POA: Diagnosis not present

## 2021-08-27 DIAGNOSIS — N319 Neuromuscular dysfunction of bladder, unspecified: Secondary | ICD-10-CM | POA: Diagnosis not present

## 2021-08-27 DIAGNOSIS — Z515 Encounter for palliative care: Secondary | ICD-10-CM | POA: Diagnosis not present

## 2021-08-27 DIAGNOSIS — L89216 Pressure-induced deep tissue damage of right hip: Secondary | ICD-10-CM | POA: Diagnosis not present

## 2021-08-27 DIAGNOSIS — S24109D Unspecified injury at unspecified level of thoracic spinal cord, subsequent encounter: Secondary | ICD-10-CM | POA: Diagnosis not present

## 2021-08-27 DIAGNOSIS — N183 Chronic kidney disease, stage 3 unspecified: Secondary | ICD-10-CM | POA: Diagnosis not present

## 2021-08-27 DIAGNOSIS — C7989 Secondary malignant neoplasm of other specified sites: Secondary | ICD-10-CM | POA: Diagnosis not present

## 2021-08-27 DIAGNOSIS — Z466 Encounter for fitting and adjustment of urinary device: Secondary | ICD-10-CM | POA: Diagnosis not present

## 2021-08-27 DIAGNOSIS — G8220 Paraplegia, unspecified: Secondary | ICD-10-CM | POA: Diagnosis not present

## 2021-08-27 DIAGNOSIS — C779 Secondary and unspecified malignant neoplasm of lymph node, unspecified: Secondary | ICD-10-CM | POA: Diagnosis not present

## 2021-08-27 DIAGNOSIS — Z936 Other artificial openings of urinary tract status: Secondary | ICD-10-CM | POA: Diagnosis not present

## 2021-08-27 DIAGNOSIS — C679 Malignant neoplasm of bladder, unspecified: Secondary | ICD-10-CM | POA: Diagnosis not present

## 2021-08-27 DIAGNOSIS — L89224 Pressure ulcer of left hip, stage 4: Secondary | ICD-10-CM | POA: Diagnosis not present

## 2021-08-28 DIAGNOSIS — C7919 Secondary malignant neoplasm of other urinary organs: Secondary | ICD-10-CM | POA: Diagnosis not present

## 2021-08-28 DIAGNOSIS — G8220 Paraplegia, unspecified: Secondary | ICD-10-CM | POA: Diagnosis not present

## 2021-08-28 DIAGNOSIS — Z936 Other artificial openings of urinary tract status: Secondary | ICD-10-CM | POA: Diagnosis not present

## 2021-08-28 DIAGNOSIS — Z515 Encounter for palliative care: Secondary | ICD-10-CM | POA: Diagnosis not present

## 2021-08-28 DIAGNOSIS — N319 Neuromuscular dysfunction of bladder, unspecified: Secondary | ICD-10-CM | POA: Diagnosis not present

## 2021-08-28 DIAGNOSIS — Z466 Encounter for fitting and adjustment of urinary device: Secondary | ICD-10-CM | POA: Diagnosis not present

## 2021-08-28 DIAGNOSIS — L89216 Pressure-induced deep tissue damage of right hip: Secondary | ICD-10-CM | POA: Diagnosis not present

## 2021-08-28 DIAGNOSIS — L89224 Pressure ulcer of left hip, stage 4: Secondary | ICD-10-CM | POA: Diagnosis not present

## 2021-08-28 DIAGNOSIS — S24109D Unspecified injury at unspecified level of thoracic spinal cord, subsequent encounter: Secondary | ICD-10-CM | POA: Diagnosis not present

## 2021-08-28 DIAGNOSIS — C679 Malignant neoplasm of bladder, unspecified: Secondary | ICD-10-CM | POA: Diagnosis not present

## 2021-08-28 DIAGNOSIS — C7989 Secondary malignant neoplasm of other specified sites: Secondary | ICD-10-CM | POA: Diagnosis not present

## 2021-08-28 DIAGNOSIS — C779 Secondary and unspecified malignant neoplasm of lymph node, unspecified: Secondary | ICD-10-CM | POA: Diagnosis not present

## 2021-08-28 DIAGNOSIS — N183 Chronic kidney disease, stage 3 unspecified: Secondary | ICD-10-CM | POA: Diagnosis not present

## 2021-08-29 DIAGNOSIS — C7919 Secondary malignant neoplasm of other urinary organs: Secondary | ICD-10-CM | POA: Diagnosis not present

## 2021-08-29 DIAGNOSIS — C779 Secondary and unspecified malignant neoplasm of lymph node, unspecified: Secondary | ICD-10-CM | POA: Diagnosis not present

## 2021-08-29 DIAGNOSIS — L89216 Pressure-induced deep tissue damage of right hip: Secondary | ICD-10-CM | POA: Diagnosis not present

## 2021-08-29 DIAGNOSIS — C679 Malignant neoplasm of bladder, unspecified: Secondary | ICD-10-CM | POA: Diagnosis not present

## 2021-08-29 DIAGNOSIS — N319 Neuromuscular dysfunction of bladder, unspecified: Secondary | ICD-10-CM | POA: Diagnosis not present

## 2021-08-29 DIAGNOSIS — G8220 Paraplegia, unspecified: Secondary | ICD-10-CM | POA: Diagnosis not present

## 2021-08-29 DIAGNOSIS — N183 Chronic kidney disease, stage 3 unspecified: Secondary | ICD-10-CM | POA: Diagnosis not present

## 2021-08-29 DIAGNOSIS — C7989 Secondary malignant neoplasm of other specified sites: Secondary | ICD-10-CM | POA: Diagnosis not present

## 2021-08-29 DIAGNOSIS — Z515 Encounter for palliative care: Secondary | ICD-10-CM | POA: Diagnosis not present

## 2021-08-29 DIAGNOSIS — L89224 Pressure ulcer of left hip, stage 4: Secondary | ICD-10-CM | POA: Diagnosis not present

## 2021-08-29 DIAGNOSIS — S24109D Unspecified injury at unspecified level of thoracic spinal cord, subsequent encounter: Secondary | ICD-10-CM | POA: Diagnosis not present

## 2021-08-29 DIAGNOSIS — Z466 Encounter for fitting and adjustment of urinary device: Secondary | ICD-10-CM | POA: Diagnosis not present

## 2021-08-29 DIAGNOSIS — Z936 Other artificial openings of urinary tract status: Secondary | ICD-10-CM | POA: Diagnosis not present

## 2021-09-02 ENCOUNTER — Encounter: Payer: Self-pay | Admitting: Family Medicine

## 2021-09-16 DEATH — deceased

## 2021-09-21 ENCOUNTER — Telehealth: Payer: BC Managed Care – PPO | Admitting: Internal Medicine

## 2021-10-01 ENCOUNTER — Other Ambulatory Visit (HOSPITAL_COMMUNITY): Payer: BC Managed Care – PPO

## 2021-10-23 ENCOUNTER — Telehealth: Payer: Self-pay | Admitting: Family Medicine

## 2021-10-23 NOTE — Telephone Encounter (Signed)
Can u mark Lorris deceased?

## 2021-12-06 ENCOUNTER — Ambulatory Visit: Payer: BC Managed Care – PPO | Admitting: Family Medicine

## 2022-05-22 IMAGING — DX DG CHEST 2V
2 series · 2 of 2 positions shown · non-contrast
Comparison: Radiograph 04/19/2020

CLINICAL DATA: Intermittent fever for 1 month

EXAM:
CHEST - 2 VIEW

[chest pa]
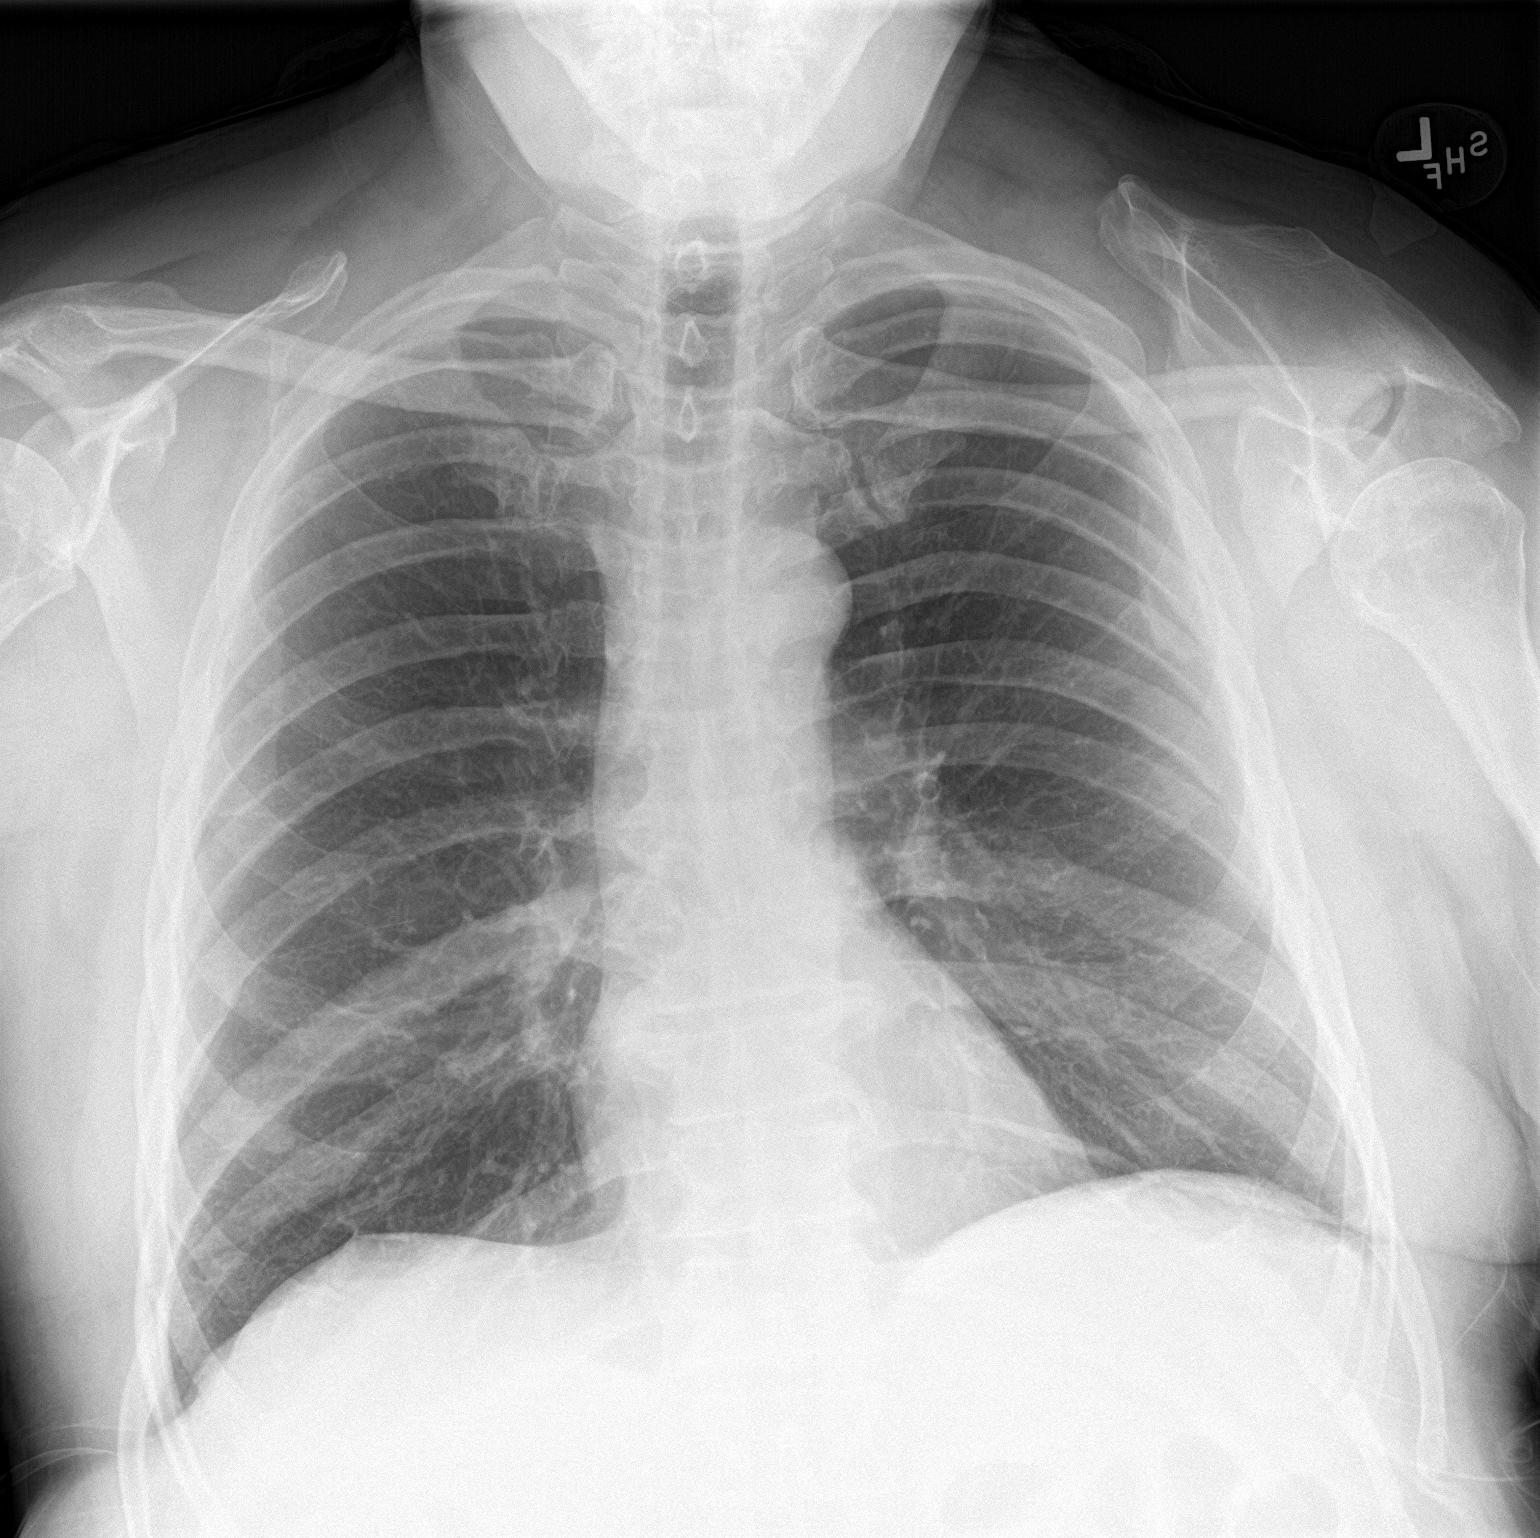

[chest lat]
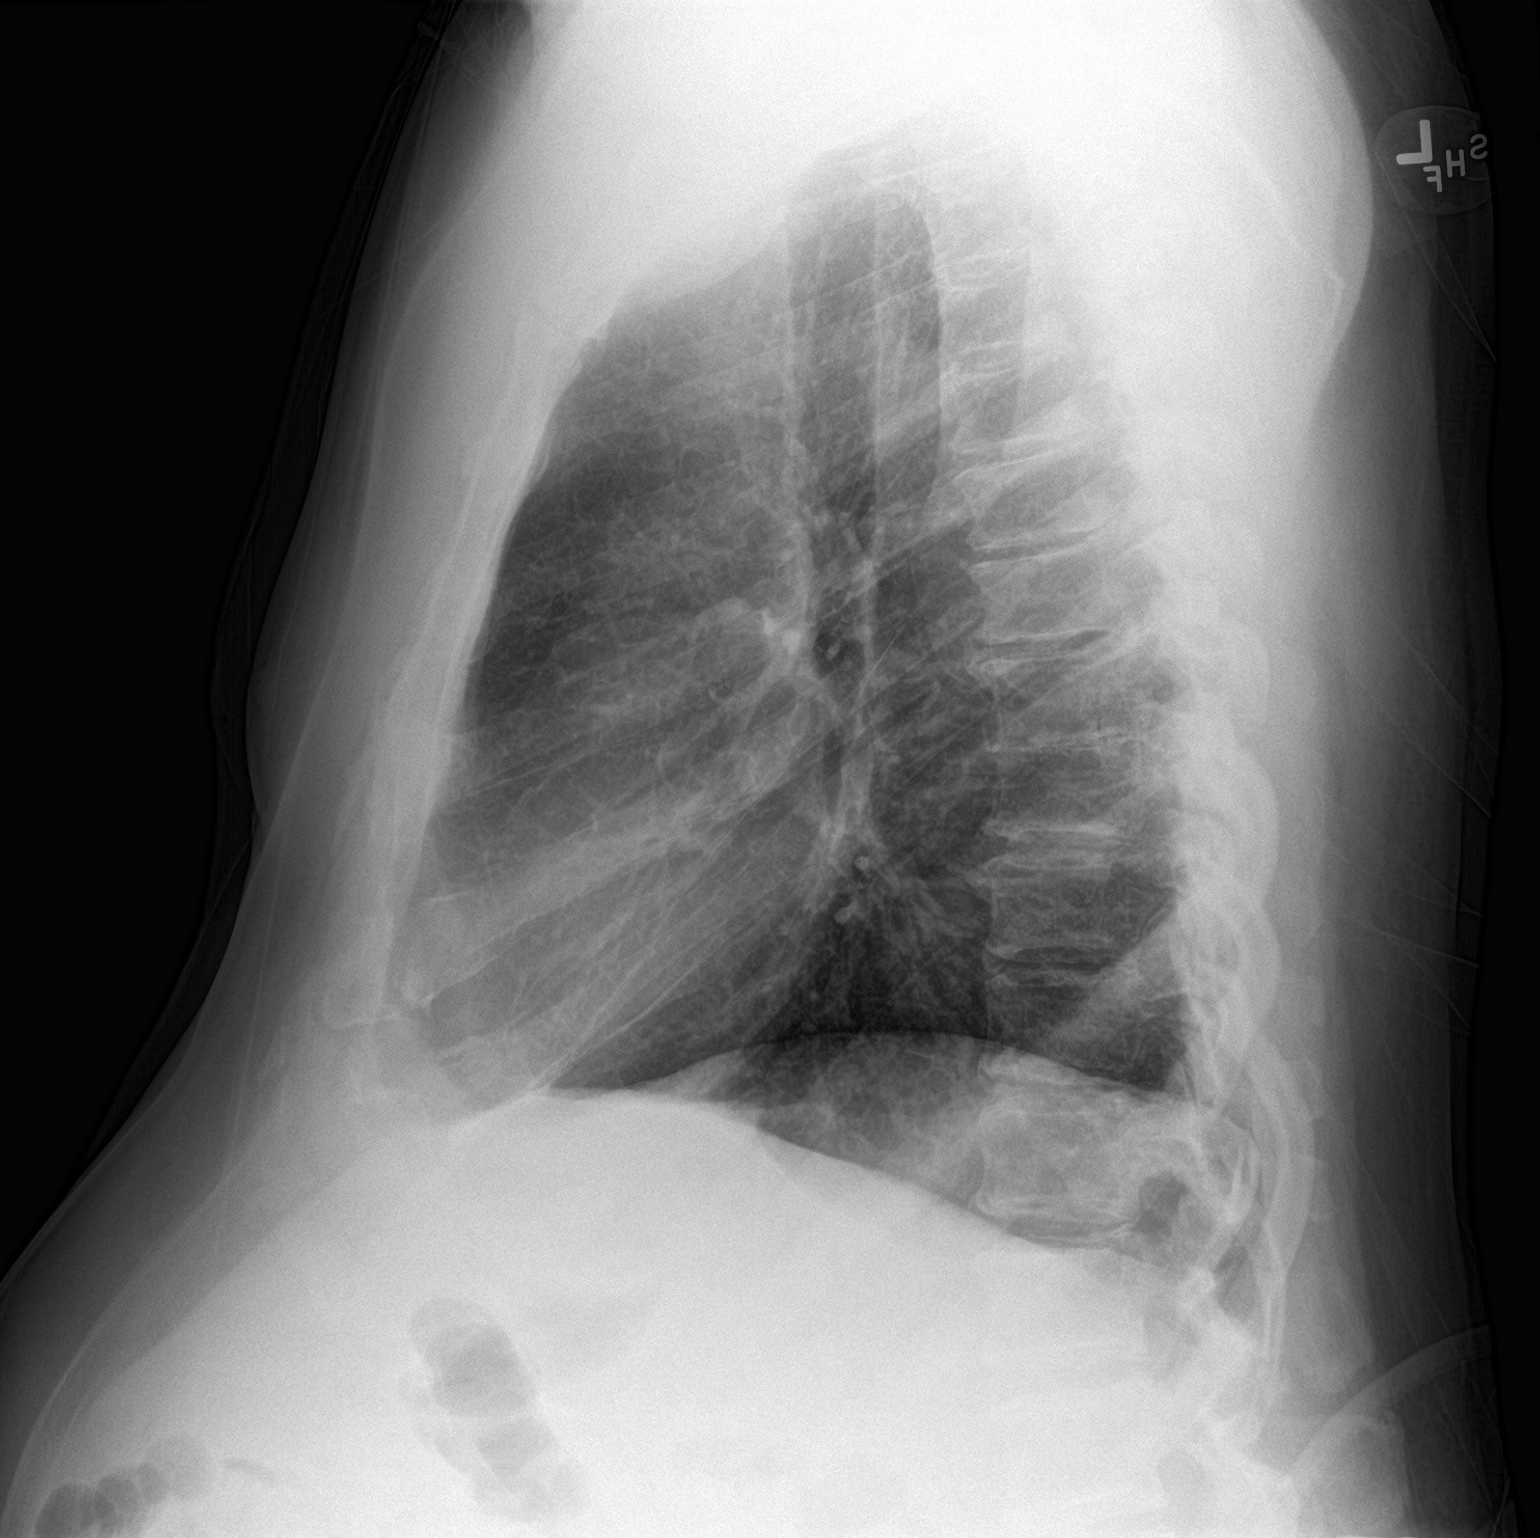

[2 of 2 positions shown; findings below may reference images not displayed]

FINDINGS: Mild airways thickening. Some mildly coarsened interstitial changes
are present as well though appear fairly similar to comparison
radiography. No consolidative process is seen. No convincing edema.
Rounded nodular opacity in the periphery of the right lung base may
reflect a nipple shadow given absence of finding on lateral view.
The cardiomediastinal contours are unremarkable. Asymmetric
positioning of the left scapula is nonspecific, correlate with exam
findings. Degenerative changes are present in the imaged spine and
shoulders.
IMPRESSION: 1. Mild airways thickening and interstitial prominence, could
reflect acute or chronic bronchitic change or reactive airways
disease.
2. Rounded nodular opacity in the periphery of the right lung base
may reflect a nipple shadow given absence of finding on lateral
view. Could consider reimaging with nipple markers.

## 2022-11-01 IMAGING — CT CT ABD-PELV W/O CM
2 of 4 series · 16 of 46 positions shown, 18 images · non-contrast
Comparison: None.

CLINICAL DATA: Hematuria

EXAM:
CT ABDOMEN AND PELVIS WITHOUT CONTRAST
TECHNIQUE: Multidetector CT imaging of the abdomen and pelvis was performed
following the standard protocol without IV contrast.

[Series 2: axial st · axial · 0.94mm/px · z∈[-714,-284]mm · 13 of 98 slices shown, 15 images]
[im 6/98  soft-tissue]
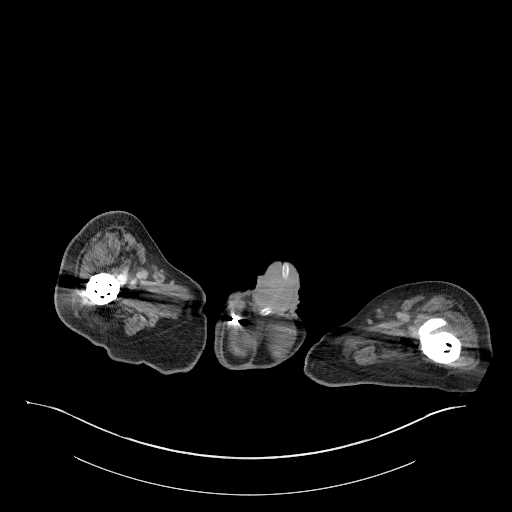
[im 6/98  bone]
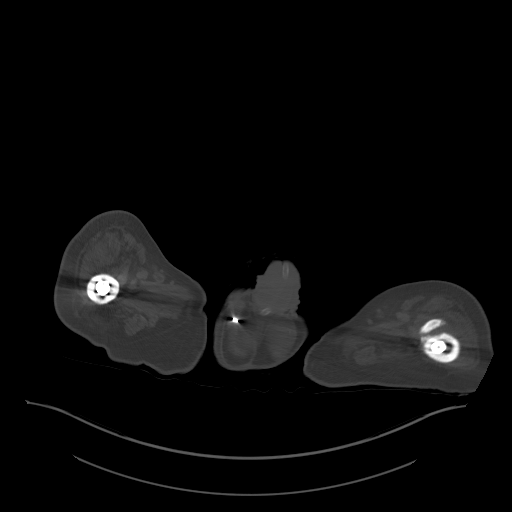
[im 12/98  soft-tissue]
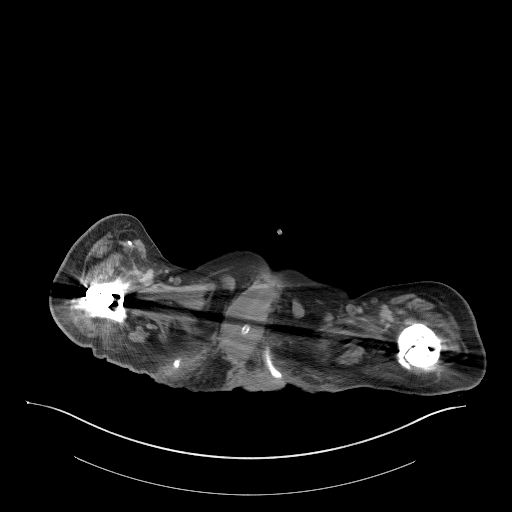
[im 23/98  soft-tissue]
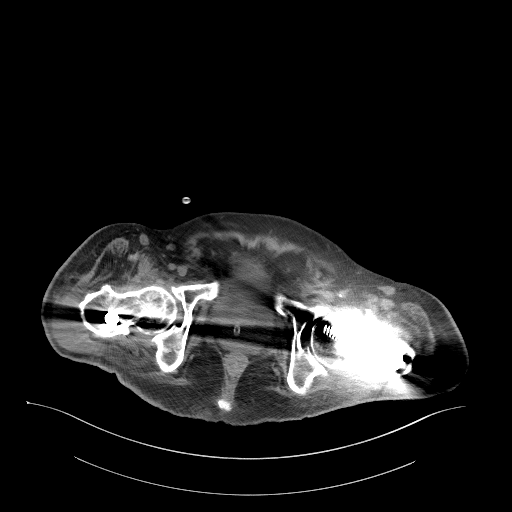
[im 29/98  soft-tissue]
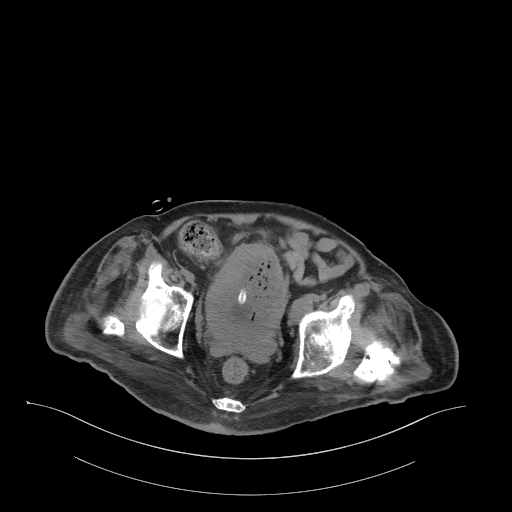
[im 35/98  soft-tissue]
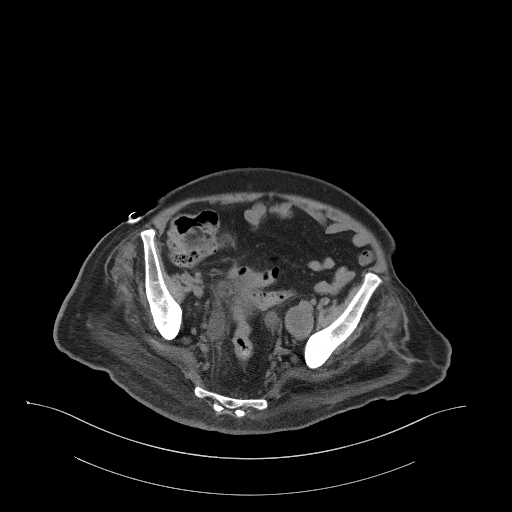
[im 40/98  soft-tissue]
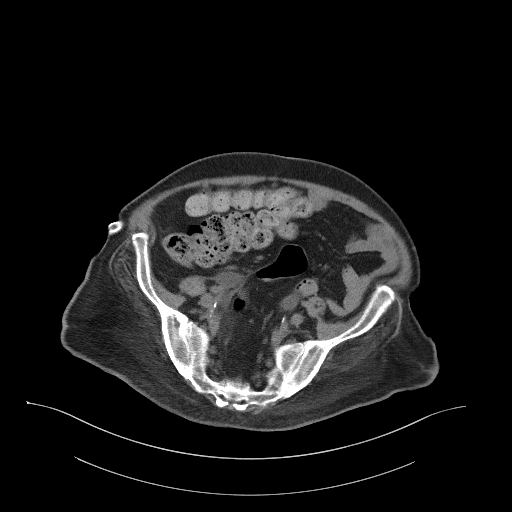
[im 52/98  soft-tissue]
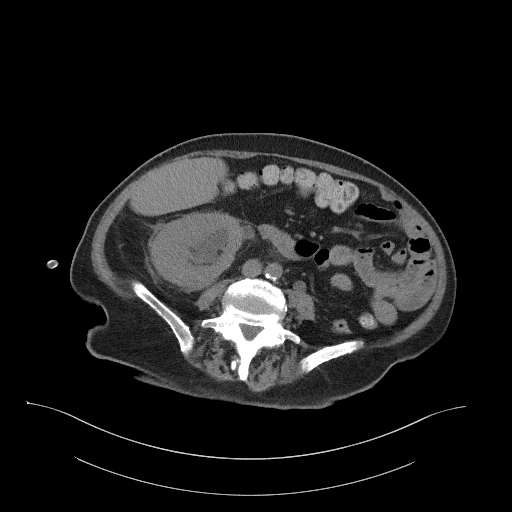
[im 58/98  soft-tissue]
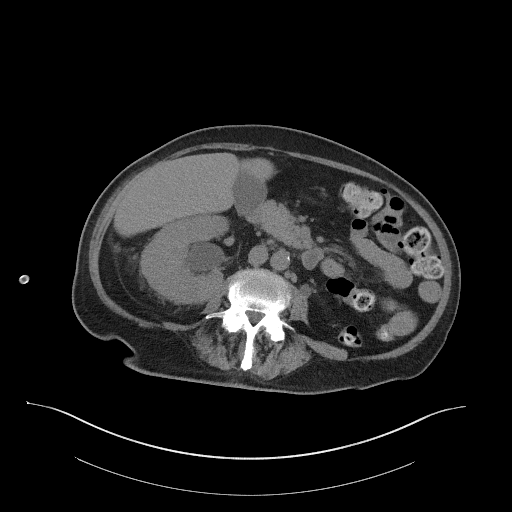
[im 63/98  soft-tissue]
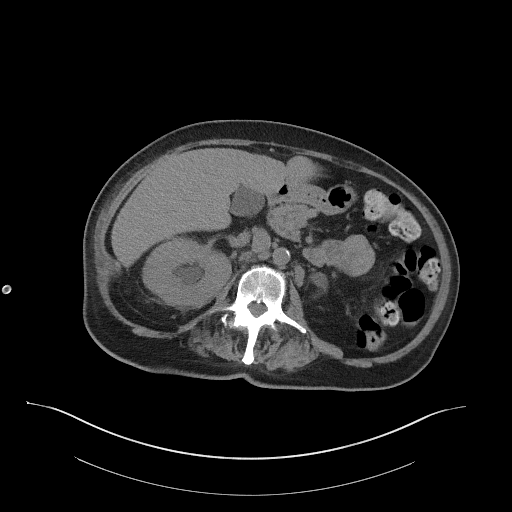
[im 63/98  bone]
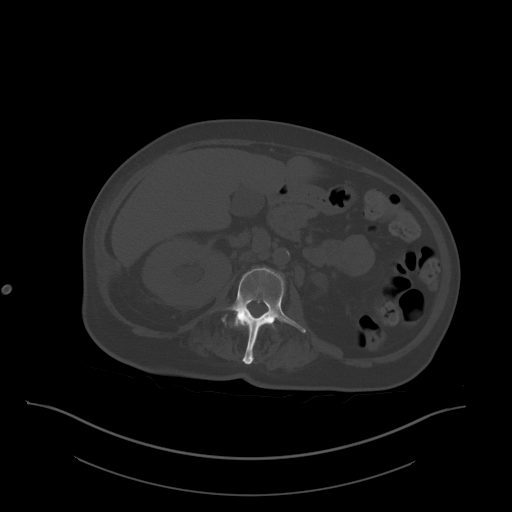
[im 69/98  soft-tissue]
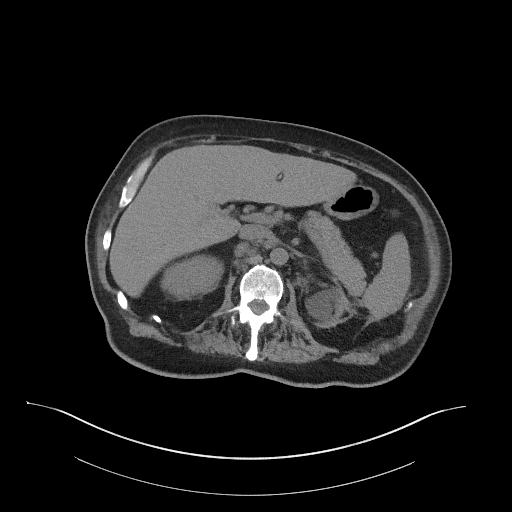
[im 75/98  soft-tissue]
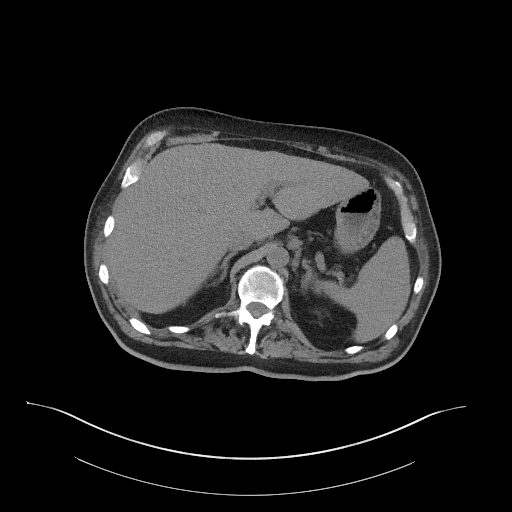
[im 86/98  soft-tissue]
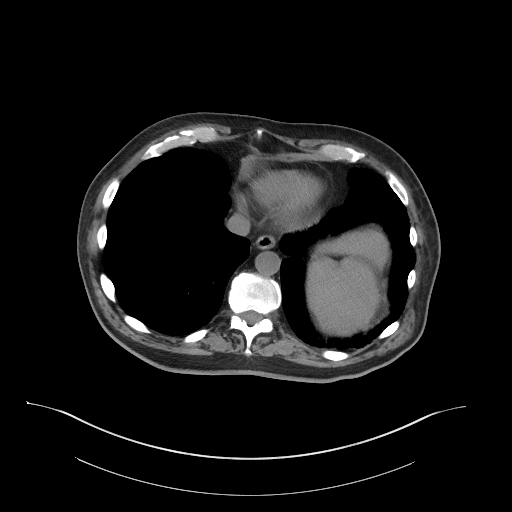
[im 92/98  soft-tissue]
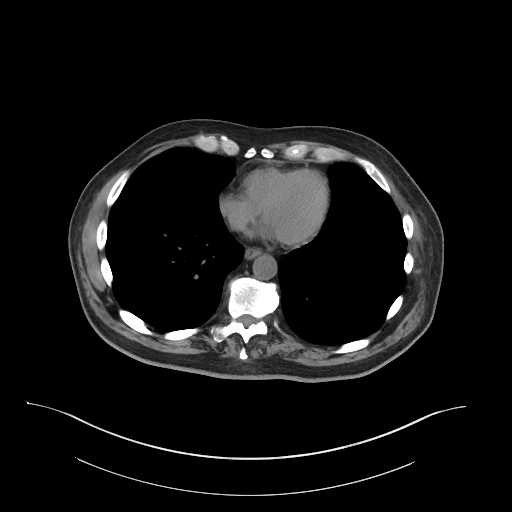

[Series 5: coronal st · coronal · 0.91mm/px · 3 of 110 slices shown]
[im 37/110  soft-tissue]
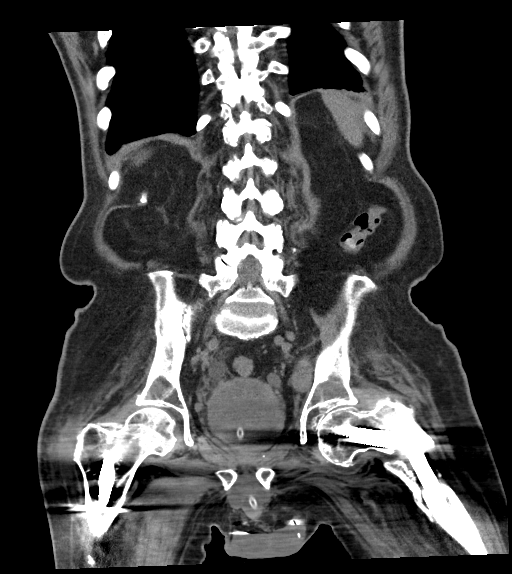
[im 49/110  soft-tissue]
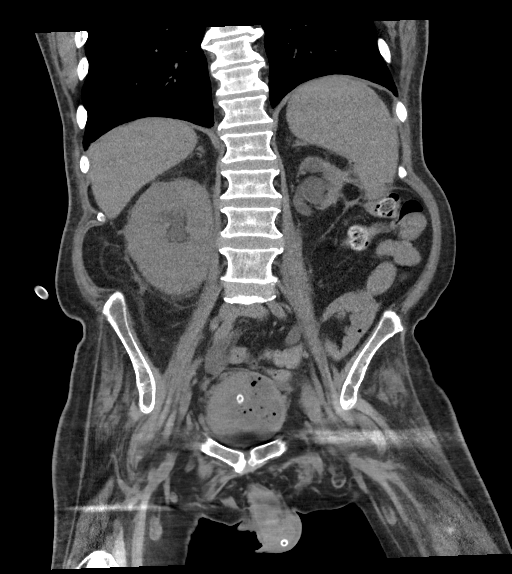
[im 61/110  soft-tissue]
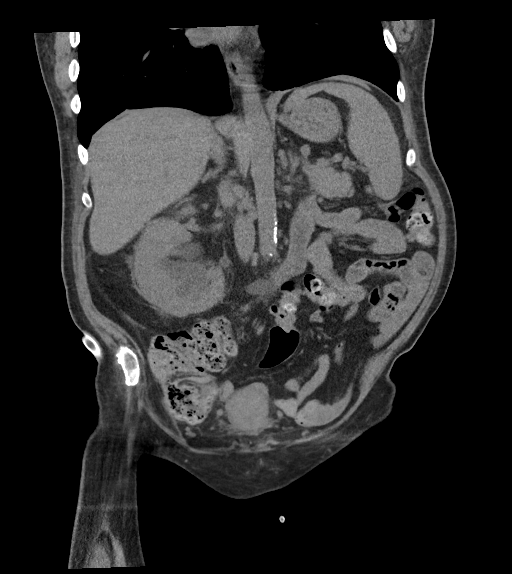

[16 of 46 positions shown; findings below may reference images not displayed]

FINDINGS: Lower chest: Lung bases are clear.

Hepatobiliary: No focal hepatic lesion. No biliary duct dilatation.
Common bile duct is normal.

Pancreas: Pancreas is normal. No ductal dilatation. No pancreatic
inflammation.

Spleen: Normal spleen

Adrenals/urinary tract: Adrenal glands normal.

LEFT kidney is atrophic. This compensatory hypertrophy of the RIGHT
kidney. There is bilateral hydroureter extending to level of the
vesicoureteral junctions. No obstructing lesion identified.

3 small nonobstructing 1 mm calculi in lower pole of the RIGHT
kidney.

There is Foley catheter in the bladder. There is dense echogenic
material within the lumen the bladder with scattered gas within the
lumen. Bladder wall appears thickened.

Stomach/Bowel: Stomach, small bowel cecum normal. Colon rectosigmoid
colon normal.

Vascular/Lymphatic:  calcification of the abdominal aorta.

There is a ovoid lesion in the LEFT pelvis adjacent to the operator
space measuring 3.4 by 3.0 cm. Lesion measures 6.2 cm in
craniocaudad dimension (sagittal image 85/series 6).

Reproductive: Prostate unremarkable. Bilateral hip prosthetics
limited evaluation of the pelvis.

Other: No free fluid.

Musculoskeletal: No aggressive osseous lesion.
IMPRESSION: 1. Bilateral hydroureter without obstructing lesion identified.
Hydroureter extends to the level of the vascular junctions. There is
echogenic material within the lumen of the bladder with scattered
pockets of gas. Presumably this echogenic material obstructs the
ureters. Chronic Foley catheter in bladder
2. Atrophic LEFT kidney and hypertrophied RIGHT kidney.
3. Several small nonobstructing calculi present lower pole the RIGHT
kidney.
4. Ovoid lesion in the LEFT operator space. Differential includes
enlarged necrotic lymph node versus vascular structure such as a
venous varix. Consider contrast enhanced imaging for further
evaluation.

## 2022-11-22 IMAGING — US IR NEPHROSTOMY PLACEMENT RIGHT
1 series · 4 of 4 positions shown · non-contrast
Comparison: CT abdomen/pelvis 03/16/2021

INDICATION: 59-year-old male with squamous metaplasia in the bladder resulting
in bilateral ureteral obstruction and significant right-sided
hydronephrosis. He has chronic atrophy of the left kidney.

EXAM:
IR ULTRASOUND GUIDANCE VASC ACCESS LEFT; IR NEPHROSTOMY PLACEMENT
RIGHT

[Series 1: ir (id) (id)/(id)/(id) ir · 4 of 4 slices shown]
[im 1/4]
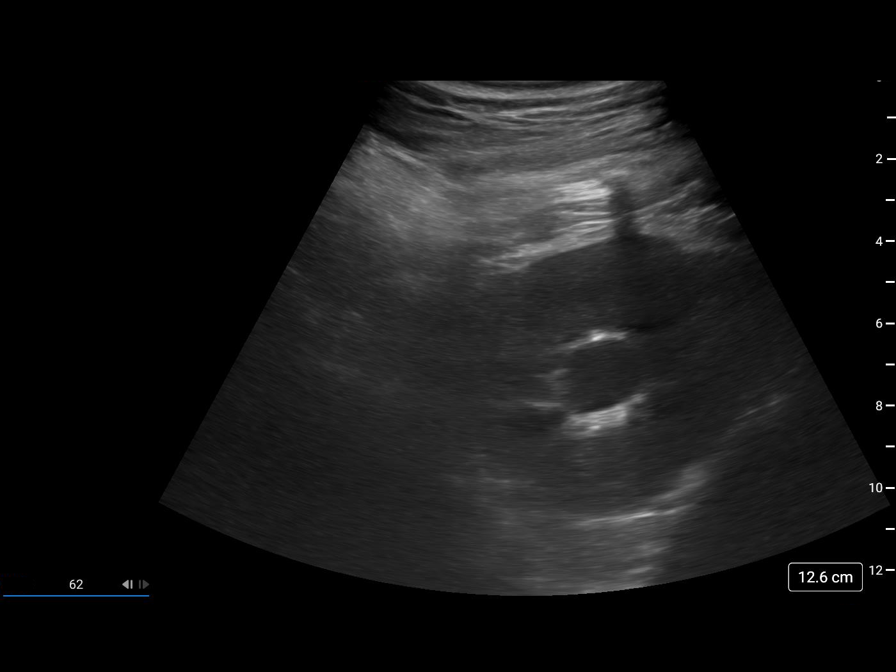
[im 2/4]
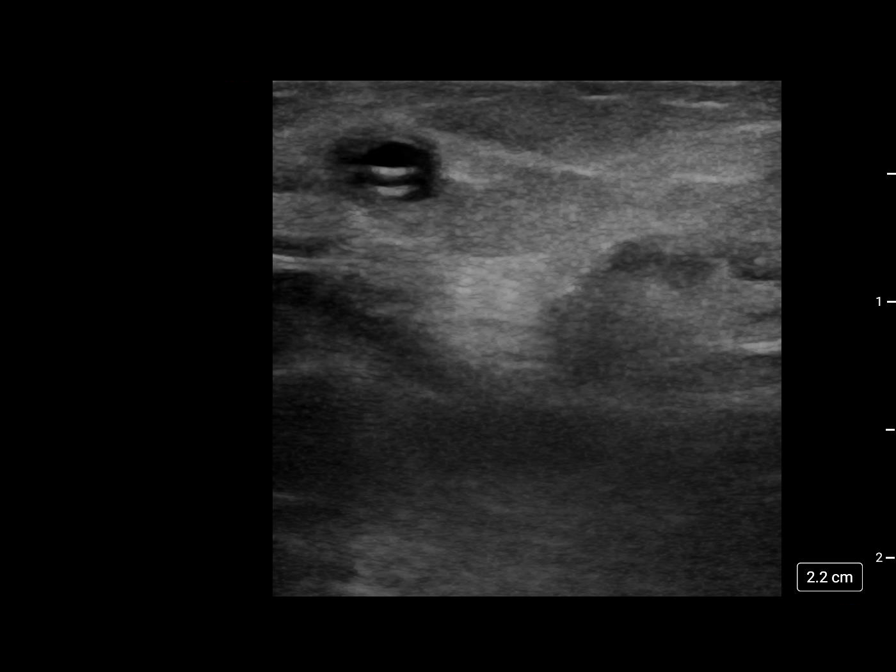
[im 3/4]
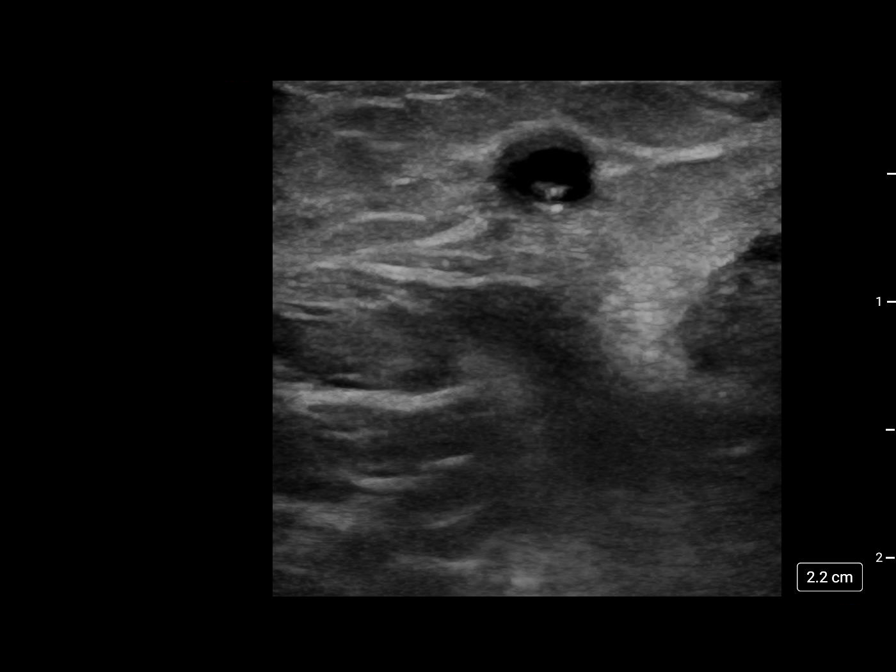
[im 4/4]
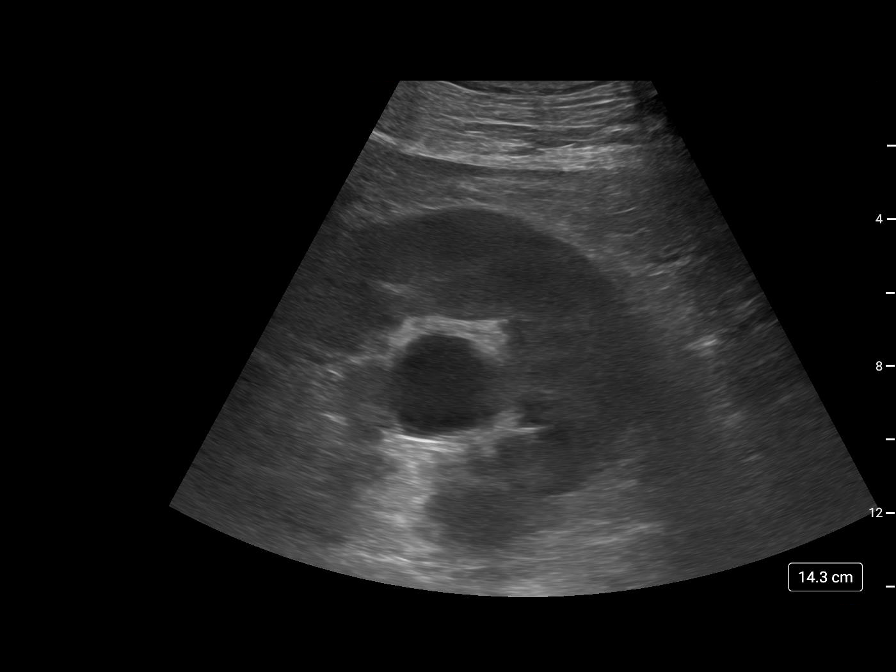

[4 of 4 positions shown; findings below may reference images not displayed]

MEDICATIONS:
2 g Rocephin; The antibiotic was administered in an appropriate time
frame prior to skin puncture.

ANESTHESIA/SEDATION:
Fentanyl 100 mcg IV; Versed 3 mg IV

Moderate Sedation Time:  25 minutes

The patient was continuously monitored during the procedure by the
interventional radiology nurse under my direct supervision.

CONTRAST:  15mL OMNIPAQUE IOHEXOL 300 MG/ML SOLN - administered into
the collecting system(s)

FLUOROSCOPY TIME:  Fluoroscopy Time: 1 minutes 42 seconds (17 mGy).

COMPLICATIONS:
None immediate.

PROCEDURE:
Informed written consent was obtained from the patient after a
thorough discussion of the procedural risks, benefits and
alternatives. All questions were addressed. Maximal Sterile Barrier
Technique was utilized including caps, mask, sterile gowns, sterile
gloves, sterile drape, hand hygiene and skin antiseptic. A timeout
was performed prior to the initiation of the procedure.

Unfortunately, peripheral IV access could not be obtained.
Therefore, a tourniquet was placed on the left arm. The antecubital
fossa was interrogated with ultrasound. A suitable vein was
identified. The overlying skin was sterilely prepped and draped in
the standard fashion with chlorhexidine skin prep. A 20 gauge IV was
then started using real-time ultrasound guidance. Images of the
patent vein and the IV entering the vein were obtained and stored
for the medical record.

The IV was then secured in place.

Attention was then turned to the right flank. The right flank was
interrogated with ultrasound. The right kidney is identified. There
is moderate to severe hydronephrosis. Local anesthesia was attained
by infiltration with 1% lidocaine. A small dermatotomy was made.
Utilizing a 21 gauge Accustick needle, a posterior interpolar calyx
was accessed. There was return of clear urine. Contrast injection
was performed outlining the hydronephrotic renal collecting system.
A wire was then advanced into the renal pelvis. The Accustick needle
was removed and exchanged for the transitional sheath. The
wire was then exchanged for a 0.035 Bentson wire.

The transitional dilator was removed. The tract was dilated to 10
French. Ronin Wendy 10.2 French all-purpose drainage catheter was
advanced over the wire and formed in the renal pelvis. Aspiration
yields turbid urine. A sample was sent for Gram stain and cultures
given the appearance.

A gentle hand injection of contrast material was then performed
confirming the position of the catheter in the renal pelvis. Images
were obtained and stored for the medical record.

The catheter was connected to gravity bag drainage and secured to
the skin with 0 Prolene suture. The patient tolerated the procedure
well.
IMPRESSION: 1. Successful ultrasound-guided IV start by CLAASSEN in the left
antecubital fossa.
2. Successful placement of a 10 French right-sided percutaneous
nephrostomy tube.

PLAN:
Consider having the patient return Interventional Radiology in 1-2
weeks for an attempt at internalization with antegrade double-J
ureteral stent placement.

Otherwise, return Interventional Radiology in 6-8 weeks for routine
nephrostomy tube check and exchange.
